# Patient Record
Sex: Female | Born: 1935 | Race: White | Hispanic: No | Marital: Married | Smoking: Former smoker
Health system: Southern US, Community
[De-identification: ages and names within clinical notes are randomized; demographics above are authoritative.]

## PROBLEM LIST (undated history)

## (undated) DIAGNOSIS — A0472 Enterocolitis due to Clostridium difficile, not specified as recurrent: Secondary | ICD-10-CM

## (undated) DIAGNOSIS — G8929 Other chronic pain: Secondary | ICD-10-CM

## (undated) DIAGNOSIS — N189 Chronic kidney disease, unspecified: Secondary | ICD-10-CM

## (undated) DIAGNOSIS — I82409 Acute embolism and thrombosis of unspecified deep veins of unspecified lower extremity: Secondary | ICD-10-CM

## (undated) DIAGNOSIS — F32A Depression, unspecified: Secondary | ICD-10-CM

## (undated) DIAGNOSIS — F0390 Unspecified dementia without behavioral disturbance: Secondary | ICD-10-CM

## (undated) DIAGNOSIS — I714 Abdominal aortic aneurysm, without rupture, unspecified: Secondary | ICD-10-CM

## (undated) DIAGNOSIS — R413 Other amnesia: Secondary | ICD-10-CM

## (undated) DIAGNOSIS — M259 Joint disorder, unspecified: Secondary | ICD-10-CM

## (undated) DIAGNOSIS — S3992XA Unspecified injury of lower back, initial encounter: Secondary | ICD-10-CM

## (undated) DIAGNOSIS — I10 Essential (primary) hypertension: Secondary | ICD-10-CM

## (undated) DIAGNOSIS — J449 Chronic obstructive pulmonary disease, unspecified: Secondary | ICD-10-CM

## (undated) DIAGNOSIS — J9610 Chronic respiratory failure, unspecified whether with hypoxia or hypercapnia: Secondary | ICD-10-CM

## (undated) DIAGNOSIS — F329 Major depressive disorder, single episode, unspecified: Secondary | ICD-10-CM

## (undated) DIAGNOSIS — D649 Anemia, unspecified: Secondary | ICD-10-CM

## (undated) DIAGNOSIS — S42301A Unspecified fracture of shaft of humerus, right arm, initial encounter for closed fracture: Secondary | ICD-10-CM

## (undated) DIAGNOSIS — W19XXXA Unspecified fall, initial encounter: Secondary | ICD-10-CM

## (undated) DIAGNOSIS — M549 Dorsalgia, unspecified: Secondary | ICD-10-CM

## (undated) DIAGNOSIS — F410 Panic disorder [episodic paroxysmal anxiety] without agoraphobia: Secondary | ICD-10-CM

## (undated) DIAGNOSIS — F419 Anxiety disorder, unspecified: Secondary | ICD-10-CM

## (undated) DIAGNOSIS — C801 Malignant (primary) neoplasm, unspecified: Secondary | ICD-10-CM

## (undated) DIAGNOSIS — Y92129 Unspecified place in nursing home as the place of occurrence of the external cause: Secondary | ICD-10-CM

## (undated) DIAGNOSIS — N950 Postmenopausal bleeding: Principal | ICD-10-CM

## (undated) HISTORY — DX: Chronic obstructive pulmonary disease, unspecified: J44.9

## (undated) HISTORY — DX: Acute embolism and thrombosis of unspecified deep veins of unspecified lower extremity: I82.409

## (undated) HISTORY — DX: Major depressive disorder, single episode, unspecified: F32.9

## (undated) HISTORY — DX: Depression, unspecified: F32.A

## (undated) HISTORY — DX: Malignant (primary) neoplasm, unspecified: C80.1

## (undated) HISTORY — DX: Abdominal aortic aneurysm, without rupture, unspecified: I71.40

## (undated) HISTORY — DX: Postmenopausal bleeding: N95.0

## (undated) HISTORY — DX: Unspecified place in nursing home as the place of occurrence of the external cause: Y92.129

## (undated) HISTORY — DX: Abdominal aortic aneurysm, without rupture: I71.4

## (undated) HISTORY — DX: Unspecified fall, initial encounter: W19.XXXA

## (undated) HISTORY — PX: HEMORRHOID SURGERY: SHX153

## (undated) HISTORY — DX: Enterocolitis due to Clostridium difficile, not specified as recurrent: A04.72

## (undated) HISTORY — DX: Unspecified dementia, unspecified severity, without behavioral disturbance, psychotic disturbance, mood disturbance, and anxiety: F03.90

---

## 1998-10-13 ENCOUNTER — Ambulatory Visit (HOSPITAL_COMMUNITY): Admission: RE | Admit: 1998-10-13 | Discharge: 1998-10-13 | Payer: Self-pay | Admitting: Family Medicine

## 1999-12-04 ENCOUNTER — Other Ambulatory Visit: Admission: RE | Admit: 1999-12-04 | Discharge: 1999-12-04 | Payer: Self-pay | Admitting: Gynecology

## 2001-07-21 ENCOUNTER — Encounter: Payer: Self-pay | Admitting: Orthopaedic Surgery

## 2001-07-21 ENCOUNTER — Encounter: Admission: RE | Admit: 2001-07-21 | Discharge: 2001-07-21 | Payer: Self-pay | Admitting: Orthopaedic Surgery

## 2002-09-30 ENCOUNTER — Ambulatory Visit (HOSPITAL_COMMUNITY): Admission: RE | Admit: 2002-09-30 | Discharge: 2002-09-30 | Payer: Self-pay | Admitting: Family Medicine

## 2002-11-16 ENCOUNTER — Other Ambulatory Visit: Admission: RE | Admit: 2002-11-16 | Discharge: 2002-11-16 | Payer: Self-pay | Admitting: Gynecology

## 2003-02-17 ENCOUNTER — Encounter: Admission: RE | Admit: 2003-02-17 | Discharge: 2003-02-17 | Payer: Self-pay | Admitting: Family Medicine

## 2003-02-17 ENCOUNTER — Encounter: Payer: Self-pay | Admitting: Family Medicine

## 2003-07-21 ENCOUNTER — Encounter: Admission: RE | Admit: 2003-07-21 | Discharge: 2003-07-21 | Payer: Self-pay | Admitting: Family Medicine

## 2004-01-22 ENCOUNTER — Observation Stay (HOSPITAL_COMMUNITY): Admission: EM | Admit: 2004-01-22 | Discharge: 2004-01-26 | Payer: Self-pay | Admitting: Emergency Medicine

## 2004-01-26 ENCOUNTER — Inpatient Hospital Stay
Admission: RE | Admit: 2004-01-26 | Discharge: 2004-02-03 | Payer: Self-pay | Admitting: Physical Medicine & Rehabilitation

## 2004-03-03 ENCOUNTER — Encounter
Admission: RE | Admit: 2004-03-03 | Discharge: 2004-06-01 | Payer: Self-pay | Admitting: Physical Medicine & Rehabilitation

## 2004-03-06 ENCOUNTER — Ambulatory Visit: Payer: Self-pay | Admitting: Physical Medicine & Rehabilitation

## 2005-10-12 ENCOUNTER — Other Ambulatory Visit: Admission: RE | Admit: 2005-10-12 | Discharge: 2005-10-12 | Payer: Self-pay | Admitting: Family Medicine

## 2005-10-15 ENCOUNTER — Encounter (HOSPITAL_COMMUNITY): Admission: RE | Admit: 2005-10-15 | Discharge: 2006-01-13 | Payer: Self-pay | Admitting: Family Medicine

## 2005-12-28 ENCOUNTER — Encounter: Admission: RE | Admit: 2005-12-28 | Discharge: 2005-12-28 | Payer: Self-pay | Admitting: Family Medicine

## 2006-08-07 ENCOUNTER — Encounter: Admission: RE | Admit: 2006-08-07 | Discharge: 2006-08-07 | Payer: Self-pay | Admitting: Family Medicine

## 2006-08-15 ENCOUNTER — Ambulatory Visit: Payer: Self-pay | Admitting: Vascular Surgery

## 2006-12-18 ENCOUNTER — Ambulatory Visit: Payer: Self-pay | Admitting: Vascular Surgery

## 2007-01-06 ENCOUNTER — Ambulatory Visit (HOSPITAL_COMMUNITY): Admission: RE | Admit: 2007-01-06 | Discharge: 2007-01-06 | Payer: Self-pay | Admitting: Orthopedic Surgery

## 2007-03-18 ENCOUNTER — Other Ambulatory Visit: Admission: RE | Admit: 2007-03-18 | Discharge: 2007-03-18 | Payer: Self-pay | Admitting: Gynecology

## 2007-09-12 ENCOUNTER — Emergency Department (HOSPITAL_COMMUNITY): Admission: EM | Admit: 2007-09-12 | Discharge: 2007-09-12 | Payer: Self-pay | Admitting: Emergency Medicine

## 2007-10-09 ENCOUNTER — Observation Stay (HOSPITAL_COMMUNITY): Admission: EM | Admit: 2007-10-09 | Discharge: 2007-10-10 | Payer: Self-pay | Admitting: Emergency Medicine

## 2008-01-16 ENCOUNTER — Inpatient Hospital Stay (HOSPITAL_COMMUNITY): Admission: EM | Admit: 2008-01-16 | Discharge: 2008-01-19 | Payer: Self-pay | Admitting: Internal Medicine

## 2008-06-10 ENCOUNTER — Emergency Department (HOSPITAL_COMMUNITY): Admission: EM | Admit: 2008-06-10 | Discharge: 2008-06-10 | Payer: Self-pay | Admitting: Emergency Medicine

## 2009-08-26 ENCOUNTER — Observation Stay (HOSPITAL_COMMUNITY): Admission: EM | Admit: 2009-08-26 | Discharge: 2009-08-27 | Payer: Self-pay | Admitting: Family Medicine

## 2009-11-23 ENCOUNTER — Emergency Department (HOSPITAL_COMMUNITY): Admission: EM | Admit: 2009-11-23 | Discharge: 2009-11-23 | Payer: Self-pay | Admitting: Emergency Medicine

## 2010-09-03 LAB — VITAMIN B12: Vitamin B-12: 227 pg/mL (ref 211–911)

## 2010-09-03 LAB — CBC
HCT: 18.4 % — ABNORMAL LOW (ref 36.0–46.0)
MCHC: 31.4 g/dL (ref 30.0–36.0)
MCV: 74.6 fL — ABNORMAL LOW (ref 78.0–100.0)
Platelets: 303 10*3/uL (ref 150–400)
Platelets: 314 10*3/uL (ref 150–400)
RBC: 2.67 MIL/uL — ABNORMAL LOW (ref 3.87–5.11)
RBC: 3.52 MIL/uL — ABNORMAL LOW (ref 3.87–5.11)
RDW: 17.4 % — ABNORMAL HIGH (ref 11.5–15.5)
WBC: 7.3 10*3/uL (ref 4.0–10.5)

## 2010-09-03 LAB — CROSSMATCH
ABO/RH(D): O POS
Antibody Screen: NEGATIVE

## 2010-09-03 LAB — COMPREHENSIVE METABOLIC PANEL
ALT: 11 U/L (ref 0–35)
GFR calc Af Amer: 60 mL/min — ABNORMAL LOW (ref >60)
GFR calc non Af Amer: 49 mL/min — ABNORMAL LOW (ref >60)
Potassium: 4.4 mEq/L (ref 3.5–5.1)
Sodium: 140 mEq/L (ref 135–145)
Total Bilirubin: 0.5 mg/dL (ref 0.3–1.2)
Total Protein: 6 g/dL (ref 6.0–8.3)

## 2010-09-03 LAB — DIFFERENTIAL
Basophils Relative: 1 % (ref 0–1)
Lymphs Abs: 2 10*3/uL (ref 0.7–4.0)
Monocytes Relative: 9 % (ref 3–12)
Neutro Abs: 4.6 10*3/uL (ref 1.7–7.7)

## 2010-09-03 LAB — ABO/RH: ABO/RH(D): O POS

## 2010-09-03 LAB — FOLATE RBC: RBC Folate: 700 ng/mL — ABNORMAL HIGH (ref 180–600)

## 2010-09-03 LAB — IRON AND TIBC: Iron: <10 ug/dL — ABNORMAL LOW (ref 42–135)

## 2010-09-03 LAB — FERRITIN: Ferritin: 3 ng/mL — ABNORMAL LOW (ref 10–291)

## 2010-10-24 NOTE — H&P (Signed)
[name], LINEMAN                   ACCOUNT NO.:  [id number]   MEDICAL RECORD NO.:  [id number]          PATIENT TYPE:  INP   LOCATION:  1415                         FACILITY:  [facility]   PHYSICIAN:  Mick Sell, MD DATE OF BIRTH:  01-20-1927   DATE OF ADMISSION:  01/16/2008  DATE OF DISCHARGE:                              HISTORY & PHYSICAL   PRIMARY MEDICAL DOCTOR:  Duncan Dull, M.D.  Being admitted to [name], M.D., for [name].   CHIEF COMPLAINT:  Dark black stools and vomiting blood.   HISTORY OF PRESENT ILLNESS:  This is a pleasant 77 year old white female  with a history of hypertension, small aortic aneurysm and several recent  falls with a vertebral fracture in May who presented to Dr. Kevan Ny'  office this morning with a complaint of some dark black vomitus x1 this  morning.  On further history, she reported 1 month of dark black stools  that are loose.  She has had no prior vomiting.  She has been taking  some aspirin, at least 2 tablets of regular strength a day, perhaps a  little more for the last several weeks for her low back pain.  She  denies any fevers, chills, night sweats, weight loss, prior nausea,  vomiting or abdominal pain.  She has had no bright red blood per rectum.   PAST MEDICAL HISTORY:  1. Hypertension.  2. A small aortic aneurysm, monitored as an outpatient.  3. History of hiatal hernia and colonic polyp, apparently diagnosed by      Dr. Randa Evens several years ago on an outpatient EGD and colonoscopy.  4. Vertebral fracture in May 2009.  Declined vertebroplasty.  5. Several recent falls.   PAST SURGICAL HISTORY:  She denies any surgeries.   SOCIAL HISTORY:  She smokes a pack a day, has for many years.  Denies  any alcohol.  She lives with her husband, who has dementia.  They live  very close to her children, who check in on her.  She has no other home  assistance it seems.   FAMILY HISTORY:  Noncontributory.   ALLERGIES:  THE PATIENT SAYS SHE IS ALLERGIC TO KEFLEX, BUT IT IS  UNCLEAR WHAT THE REACTION WAS.   MEDICATIONS:  The patient currently takes lisinopril 10 mg once a day.  She also takes a multivitamin.  She has been taking over-the-counter  aspirin as described above.  She denies any NSAIDS.   REVIEW OF SYSTEMS:  Eleven systems reviewed and negative or as per HPI.   PHYSICAL EXAMINATION:  GENERAL:  The patient is a very pleasant white  female in no acute distress.  VITAL SIGNS:  Temperature is 98.2, pulse of 75, blood pressure 124/65,  respirations 22, satting 99% on room air.  HEENT:  Pupils are equal, round, reactive to light and accommodation.  Extraocular movements are intact.  Sclerae are anicteric.  She has mild  conjunctival pallor.  Oropharynx is clear.  She has dentures.  NECK:  Supple.  No anterior cervical, posterior cervical,  supraclavicular lymphadenopathy.  HEART:  Regular with distant heart sounds, but no murmurs, rubs or  gallops appreciated.  LUNGS:  Clear to auscultation.  ABDOMEN:  Soft, nontender, nondistended.  No hepatosplenomegaly.  Positive bowel sounds.  EXTREMITIES:  There is 1+ bilateral edema to her mid shins.  On her  right she has approximately a 2-cm laceration with some mild surrounding  pinkness and warmth.  No drainage from the wound.  NEUROLOGIC:  She is alert and oriented x2 and grossly intact.   DATA:  The patient had blood work done at Dr. Kevan Ny' office with a  hemoglobin of 7.6.  Repeat here shows a white count of 7.9, hemoglobin  7.5, platelets of 308.  Basic panel is within normal limits.  BUN and  creatinine of 9 and 0.88.  LFTs are within normal limits except for an  albumin low at 3.1 and total protein low at 5.6.  Calcium is within  normal limits.  PT/PTT are pending.  There is no imaging.   IMPRESSION:  A 77 year old female with a relatively benign past medical  history who has been taking some aspirin for a vertebral fracture and   now with 1 month of dark black stools and 1 day of vomiting black  material.  She has dropped her hemoglobin from a result in April of 10.2  to 7.5.  I think this is more of a chronic bleed, and she has no  evidence of active massive bleed at this time.   PLAN:  1. I have consulted GI, and they will see her today and scope her      tomorrow.  2. I have placed her on Protonix IV per protocol.  3. We will keep her NPO except ice chips.  4. Give her IV fluids at 100 mL an hour overnight.  5. She has had 2 large-bore IVs placed and will be transfused 2 units      to maintain hemoglobin over 10.0.  6. Check H&H q.6 h.  7. PT/PTT will be pending.  8. For her lower extremity laceration and mild cellulitis, we will      give her doxycycline 100 mg twice a day for 7 days.  9. The patient will be NPO overnight for a scope in the morning.      Mick Sell, MD  Electronically Signed     DPF/MEDQ  D:  01/16/2008  T:  01/16/2008  Job:  [id number]

## 2010-10-24 NOTE — Procedures (Signed)
DUPLEX ULTRASOUND OF ABDOMINAL AORTA   INDICATION:  Follow up abdominal aortic aneurysm.   HISTORY:  Diabetes:  No  Cardiac:  Murmur  Hypertension:  Yes  Smoking:  Less than one pack per day  Family History:  Yes  Previous Surgery:   DUPLEX EXAM:         AP (cm)                   TRANSVERSE (cm)  Proximal  Mid                  2.83 cm                   3.04 cm  Distal               1.95 cm                   2.16 cm  Right Iliac          1.13 cm  Left Iliac           0.91 cm   PREVIOUS:  Date: 06/13/2005  AP:  3.23  TRANSVERSE:  3.04   IMPRESSION:  1. Stable measurements of known abdominal aortic aneurysm.  2. Proximal aorta not detected due to bowel gas.   ___________________________________________  Di Kindle. Edilia Bo, M.D.   DP/MEDQ  D:  12/18/2006  T:  12/18/2006  Job:  [id number]

## 2010-10-24 NOTE — Op Note (Signed)
NAMEDanitza, [name]                   ACCOUNT NO.:  [id number]   MEDICAL RECORD NO.:  [id number]          PATIENT TYPE:  INP   LOCATION:  1415                         [facility]ACILITY:  [facility]   PHYSICIAN:  Graylin Shiver, M.D.   DATE OF BIRTH:  May 24, 1927   DATE OF PROCEDURE:  01/17/2008  DATE OF DISCHARGE:                               OPERATIVE REPORT   INDICATIONS FOR PROCEDURE:  Anemia, gastrointestinal bleeding  characterized by melena.   Informed consent was obtained after explanation of the risks of  bleeding, infection and perforation.   PREMEDICATION:  Fentanyl 50 mcg IV, Versed 7.5 mg IV.   PROCEDURE:  With the patient in the left lateral decubitus position, the  Pentax gastroscope was inserted into the oropharynx and passed into the  esophagus.  It was advanced down the esophagus and then into the  stomach.  There was a Schatzki's ring noted at the distal esophagus and  when the scope popped through the ring into the stomach, there was some  slight resistance and some oozing of blood at the level of the  Schatzki's ring.  This would not be the source of anemia.  The scope was  advanced into the duodenum.  The 2nd portion and bulb of the duodenum  were normal.  The stomach had a normal-appearing antrum and body.  There  was a hiatal hernia, but the mucosa in the hernia looked normal.  The  esophagus otherwise looked normal except for the Schatzki's ring.  She  tolerated the procedure well without complications.   IMPRESSION:  1. Hiatal hernia.  2. Schatzki's ring   Hemoglobin and hematocrit today are 9.9 and 30.4, respectively.   RECOMMENDATIONS:  I see nothing on this exam to explain her anemia and  intermittent melena over the last several weeks.  I would recommend  following her hemoglobin and hematocrit and if they are stable, she  could  probably be discharged in a day or two with further outpatient followup  with Dr. Randa Evens who has was seen her in the past.  She has  had a  colonoscopy in the past.  This may need to be repeated and if negative,  would consider capsule endoscopy.           ______________________________  Graylin Shiver, M.D.     SFG/MEDQ  D:  01/17/2008  T:  01/17/2008  Job:  [id number]   cc:   Duncan Dull, M.D.  Fax: [phone]   Llana Aliment. Malon Kindle., M.D.  Fax: [phone]

## 2010-10-24 NOTE — Assessment & Plan Note (Signed)
OFFICE VISIT   Key, Jillian L  DOB:  30-Apr-1927                                       12/18/2006  ZOXWR#:[id number]   I saw patient in the office today for continued followup of her small  abdominal aortic aneurysm.  It has been a year and a half since her last  ultrasound.  At that time, the maximum diameter was 3.2 cm.  This was  January, 2007.  Since I saw her last, she has had some chronic low back  pain but no acute abdominal or back pain.   On review of systems, she has had no recent chest pain, chest pressure,  palpitations, or arrhythmias.  She has had no bronchitis, asthma, or  wheezing.   SOCIAL HISTORY:  She had quit smoking for about two years but has  started smoking again.   PHYSICAL EXAMINATION:  Blood pressure is 122/73.  Heart rate is 82.  I  do not detect any carotid bruits.  Lungs are clear bilaterally to  auscultation.  On cardiac exam, she has an irregular rate and rhythm.  Abdomen is soft and nontender.  Her aneurysm is palpable and nontender.  She has palpable femoral, popliteal, and posterior tibial pulses  bilaterally.   Duplex scan in our office today shows a maximum diameter aneurysm that  is 3.04 cm.  She understands that we would not consider elective repair  unless the aneurysm reached 5.5 cm in maximum diameter.  I have stressed  to her the importance of tobacco cessation, as smoking has been  associated with increased risk of aneurysm enlargement and rupture.   Given that the aneurysm has remained stable at 3 cm, which is quite  small, I think we can continue our followup at 1-1/2 years.  I will see  her back in that time period.  She knows to call sooner if she has  problems.   Di Kindle. Edilia Bo, M.D.  Electronically Signed   CSD/MEDQ  D:  12/18/2006  T:  12/19/2006  Job:  [id number]   cc:   Duncan Dull, M.D.

## 2010-10-24 NOTE — Consult Note (Signed)
[name], [name]                   ACCOUNT NO.:  [id number]   MEDICAL RECORD NO.:  [id number]          PATIENT TYPE:  INP   LOCATION:  1415                         [facility]ACILITY:  [facility]   PHYSICIAN:  Graylin Shiver, M.D.   DATE OF BIRTH:  1926/12/24   DATE OF CONSULTATION:  01/16/2008  DATE OF DISCHARGE:                                 CONSULTATION   REASON FOR CONSULTATION:  The patient is a 77 year old female who saw  Dr. Kevan Ny in the office today. She had vomited and spit up some dark-  colored, coffee-ground material yesterday afternoon. She states that she  has been passing intermittent dark stools for the past month. She has  taken some aspirin recently.  She denies any abdominal pain or  heartburn.  She has a hemoglobin and hematocrit of 7.5 and 22.7,  platelet count 308,000.   She had an endoscopy a couple years ago showing a hiatal hernia by Dr.  Randa Evens. She also had a colonoscopy around that time which showed a  hyperplastic polyp.   PAST HISTORY:  Hypertension.   MEDICATIONS:  Lisinopril, occasional aspirin.   ALLERGIES:  KEFLEX.   SOCIAL HISTORY:  Smokes off and on, does not drink alcohol.   REVIEW OF SYSTEMS:  No chest pain, shortness of breath, cough or sputum  production.   PHYSICAL EXAMINATION:  GENERAL:  She does not appear in any acute  distress.  VITAL SIGNS:  Pulse 75, blood pressure 124/65.  HEENT:  Nonicteric.  HEART:  Regular rhythm.  No murmurs.  LUNGS:  Clear.  ABDOMEN:  Soft, nontender, no hepatosplenomegaly.  EXTREMITIES:  No edema.   IMPRESSION:  Gastrointestinal bleeding characterized by melena and  coffee-ground emesis. The patient also has anemia secondary to this.   PLAN:  She is admitted to the hospital and to the hospitalist service.  She will receive blood transfusion, Protonix IV, we will follow her H&H  as we will proceed with endoscopy tomorrow morning.           ______________________________  Graylin Shiver, M.D.     SFG/MEDQ   D:  01/16/2008  T:  01/17/2008  Job:  [id number]   cc:   Duncan Dull, M.D.  Fax: [phone]   Llana Aliment. Malon Kindle., M.D.  Fax: [phone]

## 2010-10-24 NOTE — H&P (Signed)
NAMESharnelle, [name] Key                   ACCOUNT NO.:  [id number]   MEDICAL RECORD NO.:  [id number]          PATIENT TYPE:  EMS   LOCATION:  ED                            FACILITY:  [facility]   PHYSICIAN:  Corinna L. Lendell Caprice, MDDATE OF BIRTH:  [birth year]   DATE OF ADMISSION:  09/12/2007  DATE OF DISCHARGE:  09/12/2007                              HISTORY & PHYSICAL   CHIEF COMPLAINT:  Fall and back pain.   HISTORY OF PRESENT ILLNESS:  [name] is a 77 year old white female  patient of Dr. Shaune Pollack who presents after losing her balance and  falling onto her back this morning.  Initially, she said that she was  unable to get up; however, subsequently, she told me that she got up to  go to the commode and then subsequently told me that it was her son who  helped her get up to the commode.  Nevertheless, she is having  difficulty even sitting up and is concerned about going back home.  She  has a history of a fall several years ago with L1 and L2 vertebral  compression fractures.  She refused a vertebroplasty at that time but  did require a stay in the subacute care unit.  She is rather vague with  regard to the exact location of her pain but just points to the lumbar  area up to thoracic area.  Initially, she feels no numbness or tingling.  No bowel or bladder incontinence.   PAST MEDICAL HISTORY:  As above, also hypertension and reported aortic  aneurysm.   MEDICATIONS:  1. Valium as needed.  2. Lisinopril 5 mg a day.   SOCIAL HISTORY:  She cares for her husband who has Alzheimer dementia.  She is here with her daughter-in-law and granddaughter.  She has a  history of cigarette smoking.  She denies alcohol.   FAMILY HISTORY:  Noncontributory.   REVIEW OF SYSTEMS:  As above, otherwise negative.   PHYSICAL EXAMINATION:  VITAL SIGNS:  Temperature is 98.6, blood pressure  160/71, pulse 72, respiratory rate 18, and oxygen saturation 99% on room  air.  GENERAL:  The patient is in no  acute distress but moans when  transitioning to a sitting position.  HEENT:  Normocephalic, atraumatic.  Pupils are equal, round, and  reactive to light.  Dry mucous membranes.  NECK/SPINE:  Supple.  No point tenderness along the C-spine.  No  tenderness along T-L-spine,  but when sitting up, the patient points to  the left paraspinous lumbar area.  This area is nontender and has no  bruising.  No CVA tenderness.  LUNGS:  Clear to auscultation bilaterally without wheezes, rhonchi, or  rales.  CARDIOVASCULAR:  Regular rate and rhythm without murmurs, gallops, or  rubs.  ABDOMEN:  Obese, soft, and nontender.  GU AND RECTAL:  Deferred.  EXTREMITIES:  No clubbing, cyanosis, or edema.  No deformities.  SKIN:  No rash, abrasions, contusions, or ecchymoses.  PSYCHIATRIC:  The patient is occasionally agitated.  NEUROLOGIC:  Alert and oriented.  Cranial nerves and sensorimotor exam  are  intact.   LABORATORY DATA:  None.   X-RAYS:  Lumbar spine x-ray shows nothing acute, stable L1-L2  compression fractures.  Thoracic spine films show nothing acute.   ASSESSMENT/PLAN:  1. Status post fall with back pain.  Suspect muscle spasm.  The      patient will be placed on 23-hour observation.  She will get      physical therapy, occupational therapy.  I will start Tylenol 4      times a day, Robaxin 3 times a day for 2 days, and Dilaudid as      needed.  She may need placement if she has no improvement and is      still unable to walk.  2. Hypertension.  Continue lisinopril.  3. Anxiety.  Continue Valium as needed.      Corinna L. Lendell Caprice, MD  Electronically Signed     CLS/MEDQ  D:  10/09/2007  T:  10/10/2007  Job:  [id number]   cc:   Duncan Dull, M.D.

## 2010-10-27 NOTE — Discharge Summary (Signed)
NAMETeneshia, [name] [name]                   ACCOUNT NO.:  [id number]   MEDICAL RECORD NO.:  [id number]          PATIENT TYPE:  INP   LOCATION:  5028                         FACILITY:  [facility]   PHYSICIAN:  Corinna L. Lendell Caprice, MDDATE OF BIRTH:  [birth year]   DATE OF ADMISSION:  10/09/2007  DATE OF DISCHARGE:  10/10/2007                               DISCHARGE SUMMARY   DISCHARGE DIAGNOSES:  1. Status post fall and subsequent back pain secondary to muscle      spasm, improved.  2. Gait instability secondary to above, improved.  3. Hypertension.  4. Anxiety.   DISCHARGE MEDICATIONS:  Continue Valium as needed, stop lisinopril, take  Dilaudid 2 mg every 4 hours as needed for pain, Robaxin 100 mg p.o.  t.i.d. for 2 days, and Tylenol 650 mg p.o. q.i.d.   Follow up with Dr. Shaune Pollack next week.   ACTIVITY:  She is to use walker.  No driving until cleared by Dr. Kevan Ny.  Home physical therapy, occupational therapy, and nursing aid is being  arranged as an outpatient.   CONDITION:  Stable.   CONSULTATIONS:  None.   PROCEDURES:  None.   LABS:  CBC on admission significant for hemoglobin of 10.2, hematocrit  31.1, otherwise unremarkable.  Basic metabolic panel unremarkable.  Urinalysis unremarkable.  Liver function tests unremarkable.  EKG shows  normal sinus rhythm.  X-ray of the lumbar spine showed stable  compression fractures at L1 and L2.  Thoracic spine films showed nothing  acute.   HISTORY AND HOSPITAL COURSE:  [name] is a 77 year old white female  who fell and reported that she was unable to get up; however, upon  further questioning, she was actually able to get up to the commode.  She was rather vague with respect to the exact location of the pain, but  apparently she was having difficulty walking.  She had normal vital  signs and she seemed to be in quite a bit of pain, particularly sitting  up.  She had no point tenderness along the spine, but did pointed to her  paraspinous  lumbar area.  It was felt that she had muscle spasm and no  acute fracture.  She was placed on 23-hour observation, given pain  medication, muscle relaxants, physical therapy, occupational therapy,  and the following day, felt much better and was able to ambulate with  assistance.     Corinna L. Lendell Caprice, MD  Electronically Signed    CLS/MEDQ  D:  11/12/2007  T:  11/13/2007  Job:  [id number]

## 2010-10-27 NOTE — Discharge Summary (Signed)
NAMETanisia, Yokley Allyiah                   ACCOUNT NO.:  [id number]   MEDICAL RECORD NO.:  [id number]          PATIENT TYPE:  INP   LOCATION:  1415                         FACILITY:  [facility]   PHYSICIAN:  [name], M.D.DATE OF BIRTH:  1926/10/04   DATE OF ADMISSION:  01/16/2008  DATE OF DISCHARGE:  01/19/2008                               DISCHARGE SUMMARY   DISCHARGE DIAGNOSES:  1. Anemia - hemoglobin stable status post transfusion, EGD without a      bleeding source.  2. Schatzki's ring - per EGD.  3. History of small aortic aneurysm followed outpatient.  4. History of vertebral fracture in May 2000 - declined      vertebroplasty.  5. History of falls.  6. History of hiatal hernia and colonic polyp.   PROCEDURE AND STUDIES:  EGD - per Dr. Evette Cristal, hiatal hernia and  Schatzki's ring reported.  Nothing on exam to explain her anemia and  intermittent melena.   CONSULTATION:  [facility].   BRIEF HISTORY:  The patient is a 77 year old white female with the above  listed medical problems who presented with complaints of melena and  coffee-ground emesis.  It was noted that she had been taking aspirin at  least 2 tablets of the regular strength aspirin for the last several  weeks for her low back pain.  She denied fevers, chills, night sweats,  weight loss.  No abdominal pain.  She also denied hematochezia.  She was  seen by her primary care physician and directly admitted to the hospital  for further evaluation and management.   HOSPITAL COURSE:  1. Anemia - upon admission the patient's H and H was monitored and it      dropped to a low of 7.5 and she was transfused 2 units of packed      red blood cells.  Gastroenterology was consulted and Dr. Evette Cristal saw      the patient initially and an EGD was done and the results as stated      above with no bleeding source.  She did not have any further melena      or coffee-ground emesis in the hospital and her H and H remained  stable - her last hemoglobin prior to transfusion was 9.3.  Dr.      Randa Evens followed up with her in the hospital and indicated that it      was okay for her to be discharged and she was to follow up with him      for repeat hemoglobin and further evaluation/management as      appropriate.  2. Lower extremity cellulitis - upon admission the patient was found      to have 1+ lower extremity edema and on her right lower extremity      there was a 2 cm laceration with some mild surrounding erythema and      warmth.  She was empirically placed on antibiotics.  She remained      afebrile and hemodynamically stable and without leukocytosis - her      last leukocytosis  prior to discharge 9.0.  3. Hypertension - the patient was to continue her preadmission      medications upon discharge.   DISCHARGE MEDICATIONS:  1. Protonix 40 mg p.o. b.i.d.  2. Doxycycline 100 mg one p.o. b.i.d.  3. The patient to continue Valium 5 mg p.r.n. as previously.  4. Lisinopril 5 mg p.o. daily.   FOLLOWUP CARE:  1. Dr. Randa Evens on Wednesday August 12 for recheck hemoglobin as      directed.  2. Dr. Kevan Ny In 1-2 weeks.   DISCHARGE CONDITION:  Improved.  Stable.      [name], M.D.  Electronically Signed     ACV/MEDQ  D:  03/11/2008  T:  03/11/2008  Job:  [id number]   cc:   Duncan Dull, M.D.  Fax: [phone]   Llana Aliment. Malon Kindle., M.D.  Fax: [phone]

## 2011-03-06 LAB — BASIC METABOLIC PANEL
CO2: 30
Chloride: 104
Creatinine, Ser: 1
GFR calc Af Amer: >60
Potassium: 4
Sodium: 140

## 2011-03-06 LAB — CBC
HCT: 30 — ABNORMAL LOW
HCT: 31.1 — ABNORMAL LOW
Hemoglobin: 10.5 — ABNORMAL LOW
Hemoglobin: 10.2 — ABNORMAL LOW
MCHC: 35.1
MCV: 87.8
RBC: 3.42 — ABNORMAL LOW
RBC: 3.55 — ABNORMAL LOW
WBC: 6.5

## 2011-03-06 LAB — URINALYSIS, ROUTINE W REFLEX MICROSCOPIC
Bilirubin Urine: NEGATIVE
Bilirubin Urine: NEGATIVE
Glucose, UA: NEGATIVE
Glucose, UA: NEGATIVE
Hgb urine dipstick: NEGATIVE
Nitrite: NEGATIVE
Protein, ur: NEGATIVE
Specific Gravity, Urine: 1.006
pH: 7.5

## 2011-03-06 LAB — DIFFERENTIAL
Basophils Absolute: 0.3 — ABNORMAL HIGH
Basophils Relative: 1
Basophils Relative: 3 — ABNORMAL HIGH
Eosinophils Absolute: 0.1
Eosinophils Absolute: 0
Lymphs Abs: 1.8
Monocytes Absolute: 0.6
Monocytes Relative: 10
Monocytes Relative: 6
Neutrophils Relative %: 61
Neutrophils Relative %: 76

## 2011-03-06 LAB — COMPREHENSIVE METABOLIC PANEL
ALT: 13
Alkaline Phosphatase: 94
CO2: 30
GFR calc non Af Amer: 49 — ABNORMAL LOW
Glucose, Bld: 88
Potassium: 4.1
Sodium: 142
Total Bilirubin: 0.5

## 2011-03-06 LAB — URINE MICROSCOPIC-ADD ON

## 2011-03-09 LAB — PREPARE RBC (CROSSMATCH)

## 2011-03-09 LAB — BASIC METABOLIC PANEL
BUN: 9
Chloride: 110
Creatinine, Ser: 0.87
Creatinine, Ser: 0.97
GFR calc Af Amer: >60
GFR calc non Af Amer: 55 — ABNORMAL LOW
GFR calc non Af Amer: >60
Potassium: 3.6

## 2011-03-09 LAB — COMPREHENSIVE METABOLIC PANEL
AST: 14
Albumin: 3.1 — ABNORMAL LOW
Chloride: 104
Creatinine, Ser: 0.88
GFR calc Af Amer: >60
Sodium: 140
Total Bilirubin: 0.7

## 2011-03-09 LAB — CBC
MCHC: 32.7
MCHC: 32.9
MCV: 86.5
Platelets: 265
Platelets: 268
Platelets: 276
RBC: 2.74 — ABNORMAL LOW
RDW: 14.9
RDW: 16 — ABNORMAL HIGH
WBC: 7.9
WBC: 8.4
WBC: 9

## 2011-03-09 LAB — ABO/RH: ABO/RH(D): O POS

## 2011-03-09 LAB — PROTIME-INR
INR: 1.1
Prothrombin Time: 14

## 2011-03-09 LAB — TYPE AND SCREEN

## 2011-03-09 LAB — HEMOGLOBIN AND HEMATOCRIT, BLOOD
HCT: 29.8 — ABNORMAL LOW
Hemoglobin: 9.9 — ABNORMAL LOW
Hemoglobin: 9.9 — ABNORMAL LOW

## 2011-03-16 LAB — URINALYSIS, ROUTINE W REFLEX MICROSCOPIC
Nitrite: NEGATIVE
Specific Gravity, Urine: 1.015 (ref 1.005–1.030)
Urobilinogen, UA: 0.2 mg/dL (ref 0.0–1.0)
pH: 5.5 (ref 5.0–8.0)

## 2011-03-16 LAB — CBC
Hemoglobin: 11 g/dL — ABNORMAL LOW (ref 12.0–15.0)
MCHC: 33.2 g/dL (ref 30.0–36.0)
Platelets: 281 10*3/uL (ref 150–400)
RDW: 14.6 % (ref 11.5–15.5)

## 2011-03-16 LAB — TYPE AND SCREEN: ABO/RH(D): O POS

## 2011-03-16 LAB — DIFFERENTIAL
Basophils Absolute: 0.1 10*3/uL (ref 0.0–0.1)
Basophils Relative: 1 % (ref 0–1)
Monocytes Absolute: 0.6 10*3/uL (ref 0.1–1.0)
Neutro Abs: 4.8 10*3/uL (ref 1.7–7.7)

## 2011-03-16 LAB — URINE MICROSCOPIC-ADD ON

## 2011-03-16 LAB — BASIC METABOLIC PANEL
BUN: 12 mg/dL (ref 6–23)
CO2: 30 mEq/L (ref 19–32)
Calcium: 9.7 mg/dL (ref 8.4–10.5)
Creatinine, Ser: 0.82 mg/dL (ref 0.4–1.2)
Glucose, Bld: 104 mg/dL — ABNORMAL HIGH (ref 70–99)

## 2011-03-16 LAB — POCT CARDIAC MARKERS: Troponin i, poc: <0.05 ng/mL (ref 0.00–0.09)

## 2011-06-06 ENCOUNTER — Other Ambulatory Visit: Payer: Self-pay | Admitting: Family Medicine

## 2011-06-06 ENCOUNTER — Ambulatory Visit
Admission: RE | Admit: 2011-06-06 | Discharge: 2011-06-06 | Disposition: A | Discharge status: Home / Self Care | Payer: Medicare Other | Source: Ambulatory Visit | Attending: Family Medicine | Admitting: Family Medicine

## 2011-06-06 DIAGNOSIS — R05 Cough: Secondary | ICD-10-CM

## 2011-06-06 DIAGNOSIS — R062 Wheezing: Secondary | ICD-10-CM

## 2011-06-06 DIAGNOSIS — R059 Cough, unspecified: Secondary | ICD-10-CM

## 2011-09-25 ENCOUNTER — Ambulatory Visit: Payer: Medicare Other | Attending: Family Medicine

## 2011-10-21 ENCOUNTER — Emergency Department (HOSPITAL_COMMUNITY)
Admission: EM | Admit: 2011-10-21 | Discharge: 2011-10-21 | Disposition: A | Discharge status: Home / Self Care | Payer: Medicare Other | Attending: Emergency Medicine | Admitting: Emergency Medicine

## 2011-10-21 ENCOUNTER — Encounter (HOSPITAL_COMMUNITY): Payer: Self-pay | Admitting: *Deleted

## 2011-10-21 ENCOUNTER — Emergency Department (HOSPITAL_COMMUNITY): Payer: Medicare Other

## 2011-10-21 DIAGNOSIS — S5290XA Unspecified fracture of unspecified forearm, initial encounter for closed fracture: Secondary | ICD-10-CM

## 2011-10-21 DIAGNOSIS — S52599A Other fractures of lower end of unspecified radius, initial encounter for closed fracture: Secondary | ICD-10-CM | POA: Insufficient documentation

## 2011-10-21 DIAGNOSIS — F17200 Nicotine dependence, unspecified, uncomplicated: Secondary | ICD-10-CM | POA: Insufficient documentation

## 2011-10-21 DIAGNOSIS — I10 Essential (primary) hypertension: Secondary | ICD-10-CM | POA: Insufficient documentation

## 2011-10-21 DIAGNOSIS — M79609 Pain in unspecified limb: Secondary | ICD-10-CM | POA: Insufficient documentation

## 2011-10-21 DIAGNOSIS — Y93H2 Activity, gardening and landscaping: Secondary | ICD-10-CM | POA: Insufficient documentation

## 2011-10-21 DIAGNOSIS — R296 Repeated falls: Secondary | ICD-10-CM | POA: Insufficient documentation

## 2011-10-21 HISTORY — DX: Anemia, unspecified: D64.9

## 2011-10-21 HISTORY — DX: Other amnesia: R41.3

## 2011-10-21 HISTORY — DX: Essential (primary) hypertension: I10

## 2011-10-21 MED ORDER — FENTANYL CITRATE 0.05 MG/ML IJ SOLN
12.5000 ug | Freq: Once | INTRAMUSCULAR | Status: AC
  Administered 2011-10-21: 12.5 ug via INTRAVENOUS
  Filled 2011-10-21: qty 2

## 2011-10-21 MED ORDER — HYDROCODONE-ACETAMINOPHEN 5-500 MG PO TABS: 1.0000 | ORAL_TABLET | Freq: Four times a day (QID) | ORAL | Status: AC | PRN

## 2011-10-21 MED ORDER — SODIUM CHLORIDE 0.9 % IV SOLN
INTRAVENOUS | Status: DC
  Administered 2011-10-21: 02:00:00 via INTRAVENOUS

## 2011-10-21 MED ORDER — FENTANYL CITRATE 0.05 MG/ML IJ SOLN
12.5000 ug | Freq: Once | INTRAMUSCULAR | Status: AC
Start: 2011-10-21 — End: 2011-10-21
  Administered 2011-10-21: 12.5 ug via INTRAVENOUS
  Filled 2011-10-21: qty 2

## 2011-10-21 MED ORDER — ONDANSETRON HCL 4 MG/2ML IJ SOLN
4.0000 mg | Freq: Once | INTRAMUSCULAR | Status: AC
  Administered 2011-10-21: 4 mg via INTRAVENOUS
  Filled 2011-10-21: qty 2

## 2011-10-21 NOTE — Discharge Instructions (Signed)
Forearm Fracture Your caregiver has diagnosed you as having a broken bone (fracture) of the forearm. This is the part of your arm between the elbow and your wrist. Your forearm is made up of two bones. These are the radius and ulna. A fracture is a break in one or both bones. A cast or splint is used to protect and keep your injured bone from moving. The cast or splint will be on generally for about 5 to 6 weeks, with individual variations. HOME CARE INSTRUCTIONS   Keep the injured part elevated while sitting or lying down. Keeping the injury above the level of your heart (the center of the chest). This will decrease swelling and pain.   Apply ice to the injury for 15 to 20 minutes, 3 to 4 times per day while awake, for 2 days. Put the ice in a plastic bag and place a thin towel between the bag of ice and your cast or splint.   If you have a plaster or fiberglass cast:   Do not try to scratch the skin under the cast using sharp or pointed objects.   Check the skin around the cast every day. You may put lotion on any red or sore areas.   Keep your cast dry and clean.   If you have a plaster splint:   Wear the splint as directed.   You may loosen the elastic around the splint if your fingers become numb, tingle, or turn cold or blue.   Do not put pressure on any part of your cast or splint. It may break. Rest your cast only on a pillow the first 24 hours until it is fully hardened.   Your cast or splint can be protected during bathing with a plastic bag. Do not lower the cast or splint into water.   Only take over-the-counter or prescription medicines for pain, discomfort, or fever as directed by your caregiver.  SEEK IMMEDIATE MEDICAL CARE IF:   Your cast gets damaged or breaks.   You have more severe pain or swelling than you did before the cast.   Your skin or nails below the injury turn blue or gray, or feel cold or numb.   There is a bad smell or new stains and/or pus like  (purulent) drainage coming from under the cast.  MAKE SURE YOU:   Understand these instructions.   Will watch your condition.   Will get help right away if you are not doing well or get worse.

## 2011-10-21 NOTE — ED Notes (Signed)
Pt reports she was walking outside and fell this afternoon.  Reporting pain in left arm from wrist to elbow.  Mild edema noted in left wrist. Pt denies increase in pain, but just isn't able to tolerate any longer.

## 2011-10-21 NOTE — ED Provider Notes (Signed)
History     CSN: [id number]  Arrival date & time 10/21/11  0865   First MD Initiated Contact with Patient 10/21/11 0115      Chief Complaint  Patient presents with  . Fall  . Arm Pain    (Consider location/radiation/quality/duration/timing/severity/associated sxs/prior treatment) HPI History provided by patient and family bedside. Today was working in the garden and fell onto an outstretched left upper extremity. She injured her left wrist. This occurred around 4 PM in the afternoon. No LOC, head injury or neck pain. No weakness or numbness. No lower extremity injury. She has developed increasing pain and swelling. She denies any other pain injury or trauma. Pain is sharp and throbbing and radiates somewhat towards her left elbow. Hurts to touch and move without any alleviating factors. Pain is moderate to severe and persistent and worsening Past Medical History  Diagnosis Date  . Hypertension   . Memory loss   . Anemia     Past Surgical History  Procedure Date  . Hemorrhoid surgery     History reviewed. No pertinent family history.  History  Substance Use Topics  . Smoking status: Current Everyday Smoker -- 1.0 packs/day    Types: Cigarettes  . Smokeless tobacco: Not on file  . Alcohol Use: No    OB History    Grav Para Term Preterm Abortions TAB SAB Ect Mult Living                  Review of Systems  Constitutional: Negative for fever and chills.  HENT: Negative for neck pain and neck stiffness.   Eyes: Negative for pain.  Respiratory: Negative for shortness of breath.   Cardiovascular: Negative for chest pain.  Gastrointestinal: Negative for abdominal pain.  Genitourinary: Negative for dysuria.  Musculoskeletal: Positive for joint swelling. Negative for back pain.  Skin: Negative for rash.  Neurological: Negative for headaches.  All other systems reviewed and are negative.    Allergies  Keflex  Home Medications   Current Outpatient Rx  Name Route  Sig Dispense Refill  . DIAZEPAM 5 MG PO TABS Oral Take 5 mg by mouth every 6 (six) hours as needed.    . DONEPEZIL HCL 10 MG PO TABS Oral Take 10 mg by mouth at bedtime as needed.    Marland Kitchen ESCITALOPRAM OXALATE 5 MG PO TABS Oral Take 5 mg by mouth daily.    Marland Kitchen FERROUS SULFATE 325 (65 FE) MG PO TABS Oral Take 325 mg by mouth daily with breakfast.    . FOLIC ACID 1 MG PO TABS Oral Take 1 mg by mouth daily.    Marland Kitchen LISINOPRIL 5 MG PO TABS Oral Take 5 mg by mouth daily.    . MULTI-VITAMIN/MINERALS PO TABS Oral Take 1 tablet by mouth daily.    Marland Kitchen VITAMIN D (ERGOCALCIFEROL) 50000 UNITS PO CAPS Oral Take 50,000 Units by mouth every 7 (seven) days.      BP 200/59  Pulse 67  Temp 97.8 F (36.6 C)  Resp 18  Ht 5\' 6"  (1.676 m)  Wt 140 lb (63.504 kg)  BMI 22.60 kg/m2  SpO2 100%  Physical Exam  Constitutional: She is oriented to person, place, and time. She appears well-developed and well-nourished.  HENT:  Head: Normocephalic and atraumatic.  Eyes: Conjunctivae and EOM are normal. Pupils are equal, round, and reactive to light.  Neck: Trachea normal. Neck supple. No thyromegaly present.  Cardiovascular: Normal rate, regular rhythm, S1 normal, S2 normal and normal pulses.  No systolic murmur is present   No diastolic murmur is present  Pulses:      Radial pulses are 2+ on the right side, and 2+ on the left side.  Pulmonary/Chest: Effort normal and breath sounds normal. She has no wheezes. She has no rhonchi. She has no rales. She exhibits no tenderness.  Abdominal: Soft. Normal appearance and bowel sounds are normal. There is no tenderness. There is no rebound, no guarding, no CVA tenderness and negative Murphy's sign.  Musculoskeletal:       Left upper extremity with tenderness and swelling over the dorsum of left wrist. Skin intact throughout with distal neurovascular intact. No tenderness over anatomical snuff box, elbow, shoulder and clavicle.no lower extremity  tenderness or swelling    Neurological: She is alert and oriented to person, place, and time. She has normal strength. No cranial nerve deficit or sensory deficit. GCS eye subscore is 4. GCS verbal subscore is 5. GCS motor subscore is 6.  Skin: Skin is warm and dry. No rash noted. She is not diaphoretic.  Psychiatric: Her speech is normal.       Cooperative and appropriate    ED Course  Procedures (including critical care time)  Labs Reviewed - No data to display Dg Forearm Left  10/21/2011  *RADIOLOGY REPORT*  Clinical Data: Right forearm pain after fall.  LEFT FOREARM - 2 VIEW  Comparison: None.  Findings: There is focal cortical irregularity along the lateral aspect of the distal left radius with suggestion of vague linear lucency across the left distal radial metaphysis.  Changes suggest nondisplaced fracture.  The ulna appears intact.  Diffuse bone demineralization.  IMPRESSION: Probable nondisplaced fracture of the distal left radial metaphysis.  Original Report Authenticated By: Marlon Pel, M.D.   Pain medications provided. Ice. X-ray obtained and reviewed as above. Patient placed in a splint and sling was provided. Distal neurovascular remains intact after volar splint was placed.   MDM   Fall with closed left distal radius fracture as above.no other evidence of trauma or injury by exam. Hand referral provided with pain medications. Family bedside state patient will have someone with her at all times, she is right-handed, and with help states that she is able to perform ADLs at home.        Sunnie Nielsen, MD 10/21/11 [phone]

## 2011-10-21 NOTE — ED Notes (Signed)
Pt's rings, bracelets and necklaces taken off and given to pt's grandson

## 2012-02-05 ENCOUNTER — Ambulatory Visit: Payer: Medicare Other | Attending: Family Medicine

## 2012-02-05 DIAGNOSIS — R262 Difficulty in walking, not elsewhere classified: Secondary | ICD-10-CM | POA: Insufficient documentation

## 2012-02-05 DIAGNOSIS — IMO0001 Reserved for inherently not codable concepts without codable children: Secondary | ICD-10-CM | POA: Insufficient documentation

## 2012-02-05 DIAGNOSIS — M6281 Muscle weakness (generalized): Secondary | ICD-10-CM | POA: Insufficient documentation

## 2012-02-05 DIAGNOSIS — R269 Unspecified abnormalities of gait and mobility: Secondary | ICD-10-CM | POA: Insufficient documentation

## 2012-02-13 ENCOUNTER — Ambulatory Visit: Payer: Medicare Other

## 2012-02-19 ENCOUNTER — Ambulatory Visit: Payer: Medicare Other | Attending: Family Medicine

## 2012-02-19 DIAGNOSIS — R269 Unspecified abnormalities of gait and mobility: Secondary | ICD-10-CM | POA: Insufficient documentation

## 2012-02-19 DIAGNOSIS — R262 Difficulty in walking, not elsewhere classified: Secondary | ICD-10-CM | POA: Insufficient documentation

## 2012-02-19 DIAGNOSIS — M6281 Muscle weakness (generalized): Secondary | ICD-10-CM | POA: Insufficient documentation

## 2012-02-19 DIAGNOSIS — IMO0001 Reserved for inherently not codable concepts without codable children: Secondary | ICD-10-CM | POA: Insufficient documentation

## 2012-02-26 ENCOUNTER — Ambulatory Visit: Payer: Medicare Other

## 2012-03-04 ENCOUNTER — Ambulatory Visit: Payer: Medicare Other

## 2012-04-27 ENCOUNTER — Observation Stay (HOSPITAL_COMMUNITY)
Admission: EM | Admit: 2012-04-27 | Discharge: 2012-04-30 | Disposition: A | Discharge status: Home / Self Care | Payer: Medicare Other | Attending: Internal Medicine | Admitting: Internal Medicine

## 2012-04-27 ENCOUNTER — Emergency Department (HOSPITAL_COMMUNITY): Payer: Medicare Other

## 2012-04-27 ENCOUNTER — Encounter (HOSPITAL_COMMUNITY): Payer: Self-pay

## 2012-04-27 DIAGNOSIS — S22000A Wedge compression fracture of unspecified thoracic vertebra, initial encounter for closed fracture: Secondary | ICD-10-CM

## 2012-04-27 DIAGNOSIS — Z720 Tobacco use: Secondary | ICD-10-CM | POA: Diagnosis present

## 2012-04-27 DIAGNOSIS — N189 Chronic kidney disease, unspecified: Secondary | ICD-10-CM | POA: Insufficient documentation

## 2012-04-27 DIAGNOSIS — M4850XA Collapsed vertebra, not elsewhere classified, site unspecified, initial encounter for fracture: Secondary | ICD-10-CM | POA: Diagnosis present

## 2012-04-27 DIAGNOSIS — F17200 Nicotine dependence, unspecified, uncomplicated: Secondary | ICD-10-CM | POA: Insufficient documentation

## 2012-04-27 DIAGNOSIS — I129 Hypertensive chronic kidney disease with stage 1 through stage 4 chronic kidney disease, or unspecified chronic kidney disease: Secondary | ICD-10-CM | POA: Insufficient documentation

## 2012-04-27 DIAGNOSIS — S22009A Unspecified fracture of unspecified thoracic vertebra, initial encounter for closed fracture: Secondary | ICD-10-CM | POA: Insufficient documentation

## 2012-04-27 DIAGNOSIS — I10 Essential (primary) hypertension: Secondary | ICD-10-CM | POA: Diagnosis present

## 2012-04-27 DIAGNOSIS — S32000A Wedge compression fracture of unspecified lumbar vertebra, initial encounter for closed fracture: Secondary | ICD-10-CM

## 2012-04-27 DIAGNOSIS — J9610 Chronic respiratory failure, unspecified whether with hypoxia or hypercapnia: Secondary | ICD-10-CM | POA: Insufficient documentation

## 2012-04-27 DIAGNOSIS — M545 Low back pain, unspecified: Principal | ICD-10-CM | POA: Insufficient documentation

## 2012-04-27 DIAGNOSIS — R262 Difficulty in walking, not elsewhere classified: Secondary | ICD-10-CM | POA: Insufficient documentation

## 2012-04-27 DIAGNOSIS — W19XXXA Unspecified fall, initial encounter: Secondary | ICD-10-CM | POA: Insufficient documentation

## 2012-04-27 HISTORY — DX: Panic disorder (episodic paroxysmal anxiety): F41.0

## 2012-04-27 HISTORY — DX: Dorsalgia, unspecified: M54.9

## 2012-04-27 HISTORY — DX: Other chronic pain: G89.29

## 2012-04-27 HISTORY — DX: Anxiety disorder, unspecified: F41.9

## 2012-04-27 HISTORY — DX: Chronic respiratory failure, unspecified whether with hypoxia or hypercapnia: J96.10

## 2012-04-27 HISTORY — DX: Chronic kidney disease, unspecified: N18.9

## 2012-04-27 LAB — CBC WITH DIFFERENTIAL/PLATELET
Basophils Relative: 0 % (ref 0–1)
Eosinophils Absolute: 0.1 10*3/uL (ref 0.0–0.7)
Hemoglobin: 13.2 g/dL (ref 12.0–15.0)
MCH: 30.6 pg (ref 26.0–34.0)
MCHC: 32.8 g/dL (ref 30.0–36.0)
Monocytes Relative: 8 % (ref 3–12)
Neutrophils Relative %: 82 % — ABNORMAL HIGH (ref 43–77)
RDW: 14 % (ref 11.5–15.5)

## 2012-04-27 LAB — BASIC METABOLIC PANEL
BUN: 22 mg/dL (ref 6–23)
CO2: 25 mEq/L (ref 19–32)
Chloride: 103 mEq/L (ref 96–112)
Creatinine, Ser: 0.96 mg/dL (ref 0.50–1.10)
GFR calc Af Amer: 62 mL/min — ABNORMAL LOW (ref >90)

## 2012-04-27 MED ORDER — ONDANSETRON HCL 4 MG PO TABS: 4.0000 mg | ORAL_TABLET | Freq: Four times a day (QID) | ORAL | Status: DC | PRN

## 2012-04-27 MED ORDER — ONDANSETRON HCL 4 MG/2ML IJ SOLN
4.0000 mg | Freq: Once | INTRAMUSCULAR | Status: AC
  Administered 2012-04-27: 4 mg via INTRAVENOUS
  Filled 2012-04-27: qty 2

## 2012-04-27 MED ORDER — DONEPEZIL HCL 5 MG PO TABS
5.0000 mg | ORAL_TABLET | Freq: Every day | ORAL | Status: DC
  Administered 2012-04-28 – 2012-04-30 (×3): 5 mg via ORAL
  Filled 2012-04-27 (×3): qty 1

## 2012-04-27 MED ORDER — ENOXAPARIN SODIUM 40 MG/0.4ML ~~LOC~~ SOLN
40.0000 mg | SUBCUTANEOUS | Status: DC
  Administered 2012-04-27 – 2012-04-28 (×2): 40 mg via SUBCUTANEOUS
  Filled 2012-04-27 (×2): qty 0.4

## 2012-04-27 MED ORDER — CITALOPRAM HYDROBROMIDE 20 MG PO TABS
20.0000 mg | ORAL_TABLET | Freq: Every day | ORAL | Status: DC
  Administered 2012-04-27 – 2012-04-30 (×4): 20 mg via ORAL
  Filled 2012-04-27 (×4): qty 1

## 2012-04-27 MED ORDER — METHOCARBAMOL 500 MG PO TABS: 500.0000 mg | ORAL_TABLET | Freq: Three times a day (TID) | ORAL | Status: DC | PRN

## 2012-04-27 MED ORDER — VITAMIN D (ERGOCALCIFEROL) 1.25 MG (50000 UNIT) PO CAPS
50000.0000 [IU] | ORAL_CAPSULE | ORAL | Status: DC
  Administered 2012-04-28: 50000 [IU] via ORAL
  Filled 2012-04-27: qty 1

## 2012-04-27 MED ORDER — LISINOPRIL 5 MG PO TABS
5.0000 mg | ORAL_TABLET | Freq: Every day | ORAL | Status: DC
  Administered 2012-04-27 – 2012-04-30 (×4): 5 mg via ORAL
  Filled 2012-04-27 (×4): qty 1

## 2012-04-27 MED ORDER — TRAZODONE HCL 50 MG PO TABS
50.0000 mg | ORAL_TABLET | Freq: Every evening | ORAL | Status: DC | PRN
  Administered 2012-04-28 – 2012-04-29 (×2): 50 mg via ORAL
  Filled 2012-04-27 (×2): qty 1

## 2012-04-27 MED ORDER — DOCUSATE SODIUM 100 MG PO CAPS
100.0000 mg | ORAL_CAPSULE | Freq: Two times a day (BID) | ORAL | Status: DC
  Administered 2012-04-27 – 2012-04-30 (×7): 100 mg via ORAL
  Filled 2012-04-27 (×7): qty 1

## 2012-04-27 MED ORDER — SODIUM CHLORIDE 0.9 % IV SOLN
INTRAVENOUS | Status: AC
  Administered 2012-04-27: 100 mL via INTRAVENOUS

## 2012-04-27 MED ORDER — ONDANSETRON HCL 4 MG/2ML IJ SOLN: 4.0000 mg | Freq: Four times a day (QID) | INTRAMUSCULAR | Status: DC | PRN

## 2012-04-27 MED ORDER — ADULT MULTIVITAMIN W/MINERALS CH
1.0000 | ORAL_TABLET | Freq: Every day | ORAL | Status: DC
  Administered 2012-04-27 – 2012-04-30 (×4): 1 via ORAL
  Filled 2012-04-27 (×4): qty 1

## 2012-04-27 MED ORDER — ACETAMINOPHEN 325 MG PO TABS
650.0000 mg | ORAL_TABLET | Freq: Three times a day (TID) | ORAL | Status: DC
  Administered 2012-04-27 – 2012-04-30 (×9): 650 mg via ORAL
  Filled 2012-04-27 (×9): qty 2

## 2012-04-27 MED ORDER — SODIUM CHLORIDE 0.9 % IV BOLUS (SEPSIS)
1000.0000 mL | Freq: Once | INTRAVENOUS | Status: AC
  Administered 2012-04-27: 1000 mL via INTRAVENOUS

## 2012-04-27 MED ORDER — OXYCODONE HCL 5 MG PO TABS
5.0000 mg | ORAL_TABLET | ORAL | Status: DC | PRN
  Administered 2012-04-28 – 2012-04-29 (×3): 5 mg via ORAL
  Filled 2012-04-27 (×3): qty 1

## 2012-04-27 MED ORDER — MORPHINE SULFATE 4 MG/ML IJ SOLN
4.0000 mg | Freq: Once | INTRAMUSCULAR | Status: AC
  Administered 2012-04-27: 4 mg via INTRAVENOUS
  Filled 2012-04-27: qty 1

## 2012-04-27 MED ORDER — FOLIC ACID 1 MG PO TABS
1.0000 mg | ORAL_TABLET | Freq: Every day | ORAL | Status: DC
  Administered 2012-04-27 – 2012-04-30 (×4): 1 mg via ORAL
  Filled 2012-04-27 (×4): qty 1

## 2012-04-27 MED ORDER — DOUBLE ANTIBIOTIC 500-10000 UNIT/GM EX OINT
1.0000 "application " | TOPICAL_OINTMENT | Freq: Every day | CUTANEOUS | Status: DC
  Administered 2012-04-27 – 2012-04-30 (×4): 1 via TOPICAL
  Filled 2012-04-27 (×4): qty 1

## 2012-04-27 MED ORDER — SENNA 8.6 MG PO TABS
1.0000 | ORAL_TABLET | Freq: Two times a day (BID) | ORAL | Status: DC
  Administered 2012-04-27 – 2012-04-30 (×7): 8.6 mg via ORAL
  Filled 2012-04-27 (×8): qty 1

## 2012-04-27 MED ORDER — FERROUS SULFATE 325 (65 FE) MG PO TABS
325.0000 mg | ORAL_TABLET | Freq: Every day | ORAL | Status: DC
  Administered 2012-04-28 – 2012-04-30 (×3): 325 mg via ORAL
  Filled 2012-04-27 (×3): qty 1

## 2012-04-27 MED ORDER — DIAZEPAM 2 MG PO TABS: 2.0000 mg | ORAL_TABLET | Freq: Every evening | ORAL | Status: DC | PRN

## 2012-04-27 NOTE — ED Provider Notes (Signed)
History   This chart was scribed for Jillian Hutching, MD by Charolett Bumpers, ER Scribe. The patient was seen in room APA06/[facility]06. Patient's care was started at 0902.   CSN: [id number]  Arrival date & time 04/27/12  7829   First MD Initiated Contact with Patient 04/27/12 0902      Chief Complaint  Patient presents with  . Fall     The history is provided by the patient. No language interpreter was used.   [name] is a 78 y.o. female who presents to the Emergency Department via EMS complaining of constant, moderate mid and lower back pain after a mechanical fall this morning. She states that she fell while walking to bathroom after losing her balance. She denies any dizziness or syncope prior to fall. She denies LOC or hitting her head. She describes the back pain as soreness and is aggravated with movement. She denies any hip pain, neck pain, headache, chest pain or abdominal pain. She denies any extremity pain. She reports a h/o fractures in her back and chronic back pain. She lives at home and is independent. Family reports a h/o falls and near falls.   PCP: Dr. Kevan Ny  Past Medical History  Diagnosis Date  . Hypertension   . Memory loss   . Anemia   . Chronic back pain   . Chronic kidney disease   . Anxiety   . Panic attack     Past Surgical History  Procedure Date  . Hemorrhoid surgery     No family history on file.  History  Substance Use Topics  . Smoking status: Current Every Day Smoker -- 1.0 packs/day    Types: Cigarettes  . Smokeless tobacco: Not on file  . Alcohol Use: No    OB History    Grav Para Term Preterm Abortions TAB SAB Ect Mult Living                  Review of Systems A complete 10 system review of systems was obtained and all systems are negative except as noted in the HPI and PMH.   Allergies  Biaxin; Erythromycin; Keflex; Macrodantin; Penicillins; and Sulfa antibiotics  Home Medications   Current Outpatient Rx  Name  Route   Sig  Dispense  Refill  . DIAZEPAM 5 MG PO TABS   Oral   Take 5 mg by mouth every 6 (six) hours as needed.         . DONEPEZIL HCL 10 MG PO TABS   Oral   Take 10 mg by mouth at bedtime as needed.         Marland Kitchen ESCITALOPRAM OXALATE 5 MG PO TABS   Oral   Take 5 mg by mouth daily.         Marland Kitchen FERROUS SULFATE 325 (65 FE) MG PO TABS   Oral   Take 325 mg by mouth daily with breakfast.         . FOLIC ACID 1 MG PO TABS   Oral   Take 1 mg by mouth daily.         Marland Kitchen LISINOPRIL 5 MG PO TABS   Oral   Take 5 mg by mouth daily.         . MULTI-VITAMIN/MINERALS PO TABS   Oral   Take 1 tablet by mouth daily.         Marland Kitchen VITAMIN D (ERGOCALCIFEROL) 50000 UNITS PO CAPS   Oral   Take 50,000 Units  by mouth every 7 (seven) days.           BP 153/86  Pulse 61  Temp 98 F (36.7 C) (Oral)  Resp 18  Ht 5\' 4"  (1.626 m)  Wt 135 lb 7 oz (61.434 kg)  BMI 23.25 kg/m2  SpO2 95%  Physical Exam  Nursing note and vitals reviewed. Constitutional: She is oriented to person, place, and time. She appears well-developed and well-nourished.  HENT:  Head: Normocephalic and atraumatic.  Eyes: Conjunctivae normal and EOM are normal. Pupils are equal, round, and reactive to light.  Neck: Normal range of motion. Neck supple.  Cardiovascular: Normal rate, regular rhythm and normal heart sounds.   No murmur heard. Pulmonary/Chest: Effort normal and breath sounds normal. No respiratory distress. She has no wheezes.  Abdominal: Soft. Bowel sounds are normal. She exhibits no distension. There is no tenderness.  Musculoskeletal: Normal range of motion. She exhibits tenderness. She exhibits no edema.       Diffuse thoracic and lumbar spinal tenderness.   Neurological: She is alert and oriented to person, place, and time.  Skin: Skin is warm and dry.  Psychiatric: She has a normal mood and affect.    ED Course  Procedures (including critical care time)  DIAGNOSTIC STUDIES: Oxygen Saturation is  95% on room air, adequate by my interpretation.    COORDINATION OF CARE:  09:40-Discussed planned course of treatment with the patient and family including x-rays of thoracic and lumbar spine, who are agreeable at this time.   Results for orders placed during the hospital encounter of 04/27/12  CBC WITH DIFFERENTIAL      Component Value Range   WBC 10.7 (*) 4.0 - 10.5 K/uL   RBC 4.32  3.87 - 5.11 MIL/uL   Hemoglobin 13.2  12.0 - 15.0 g/dL   HCT 09.8  11.9 - 14.7 %   MCV 93.3  78.0 - 100.0 fL   MCH 30.6  26.0 - 34.0 pg   MCHC 32.8  30.0 - 36.0 g/dL   RDW 82.9  56.2 - 13.0 %   Platelets 144 (*) 150 - 400 K/uL   Neutrophils Relative 82 (*) 43 - 77 %   Neutro Abs 8.8 (*) 1.7 - 7.7 K/uL   Lymphocytes Relative 9 (*) 12 - 46 %   Lymphs Abs 0.9  0.7 - 4.0 K/uL   Monocytes Relative 8  3 - 12 %   Monocytes Absolute 0.9  0.1 - 1.0 K/uL   Eosinophils Relative 1  0 - 5 %   Eosinophils Absolute 0.1  0.0 - 0.7 K/uL   Basophils Relative 0  0 - 1 %   Basophils Absolute 0.0  0.0 - 0.1 K/uL   WBC Morphology ATYPICAL LYMPHOCYTES     RBC Morphology SPECIMEN CHECKED FOR CLOTS     Smear Review PERFORMED    BASIC METABOLIC PANEL      Component Value Range   Sodium 139  135 - 145 mEq/L   Potassium 4.0  3.5 - 5.1 mEq/L   Chloride 103  96 - 112 mEq/L   CO2 25  19 - 32 mEq/L   Glucose, Bld 80  70 - 99 mg/dL   BUN 22  6 - 23 mg/dL   Creatinine, Ser 8.65  0.50 - 1.10 mg/dL   Calcium 9.6  8.4 - 78.4 mg/dL   GFR calc non Af Amer 53 (*) >90 mL/min   GFR calc Af Amer 62 (*) >90 mL/min  Dg Thoracic Spine 2 View  04/27/2012  *RADIOLOGY REPORT*  Clinical Data: Fall, back pain  THORACIC SPINE - 2 VIEW  Comparison: Chest radiographs dated 06/06/2011  Findings: Normal thoracic kyphosis.  Mild superior endplate compression deformity at T12, age indeterminate but new from 2012.  Vertebral body heights are otherwise maintained.  Mild multilevel degenerative changes.  Visualized lungs are essentially clear.   Moderate hiatal hernia.  IMPRESSION: Mild superior endplate compression deformity at T12, age indeterminate but new from 2012.  Correlate with the site of the patient's pain.   Original Report Authenticated By: Charline Bills, M.D.    Dg Lumbar Spine Complete  04/27/2012  *RADIOLOGY REPORT*  Clinical Data: Fall, back pain  LUMBAR SPINE - COMPLETE 4+ VIEW  Comparison: 10/09/2007  Findings: Five lumbar-type vertebral bodies.  Severe compression deformity at L1, unchanged.  Severe compression deformity at L2, progressed from 2009.  Visualized bony pelvis appears intact.  Vascular calcifications.  Moderate hiatal hernia.  IMPRESSION: Severe compression deformity at L2, progressed from 2009, but favored to be chronic.  Correlate with the site of the patient's pain.  Severe compression deformity at L1, chronic.   Original Report Authenticated By: Charline Bills, M.D.      No diagnosis found.   Date: 04/27/2012  Rate: 83  Rhythm: normal sinus rhythm  QRS Axis: right  Intervals: normal  ST/T Wave abnormalities: normal  Conduction Disutrbances:first-degree A-V block   Narrative Interpretation:   Old EKG Reviewed: none available   MDM  Status post fall today compression fractures at T12, L1, L2. Some are of indeterminate age. Patient is unable to ambulate and take care of herself. Admit.   I personally performed the services described in this documentation, which was scribed in my presence. The recorded information has been reviewed and is accurate.       Jillian Hutching, MD 04/27/12 1434

## 2012-04-27 NOTE — ED Notes (Signed)
Pt arrived from home by ems, lost balance and fell this am.  c-collar in place, denies loc.  H/o chronic back pain. Now c/o mid back pain

## 2012-04-27 NOTE — H&P (Addendum)
Hospital Admission Note Date: 04/27/2012  Patient name: [name] Medical record number: [id number] Date of birth: Dec 15, 1926 Age: 78 y.o. Gender: female PCP: Hollice Espy, MD  Attending physician: Christiane Ha, MD  Chief Complaint: back pain  History of Present Illness:  [name] is an 78 y.o. female who was brought to the emergency room via EMS after a fall. She tripped over some slippers and was unable to get up. Her son found her lying on her back and she had too much pain to movement. She is currently complaining of low back pain. She has a previous history of falls and vertebral compression fractures. She has an unsteady gait at baseline. She is supposed to use a rolling walker, but family have not yet picked it up. Imaging studies in the emergency room show a new T1 compression fracture compared to 2012. She was unable to get up and walk in the emergency room. She is widowed. She lives by herself and family members spend much of the day at her house to assist.  Past Medical History  Diagnosis Date  . Hypertension   . Memory loss   . Anemia   . Chronic back pain   . Chronic kidney disease   . Anxiety   . Panic attack     Meds: Prescriptions prior to admission  Medication Sig Dispense Refill  . cyanocobalamin (,VITAMIN B-12,) 1000 MCG/ML injection Inject 1,000 mcg into the muscle every 30 (thirty) days.      . diazepam (VALIUM) 5 MG tablet Take 5 mg by mouth at bedtime as needed. Sleep      . donepezil (ARICEPT) 5 MG tablet Take 5 mg by mouth at bedtime.      Marland Kitchen escitalopram (LEXAPRO) 5 MG tablet Take 5 mg by mouth daily.      . ferrous sulfate 325 (65 FE) MG tablet Take 325 mg by mouth daily with breakfast.      . folic acid (FOLVITE) 1 MG tablet Take 1 mg by mouth daily.      Marland Kitchen lisinopril (PRINIVIL,ZESTRIL) 5 MG tablet Take 5 mg by mouth daily.      . Multiple Vitamins-Minerals (MULTIVITAMIN WITH MINERALS) tablet Take 1 tablet by mouth daily.      .  polymixin-bacitracin (POLYSPORIN) 500-10000 UNIT/GM OINT ointment Apply 1 application topically daily.      . Vitamin D, Ergocalciferol, (DRISDOL) 50000 UNITS CAPS Take 50,000 Units by mouth every 7 (seven) days. Takes on Mondays        Allergies: Biaxin; Erythromycin; Keflex; Macrodantin; Penicillins; and Sulfa antibiotics History   Social History  . Marital Status: Married    Spouse Name: N/A    Number of Children: N/A  . Years of Education: N/A   Occupational History  . Not on file.   Social History Main Topics  . Smoking status: Current Every Day Smoker -- 1.0 packs/day    Types: Cigarettes  . Smokeless tobacco: Not on file  . Alcohol Use: No  . Drug Use: No  . Sexually Active: No   Other Topics Concern  . Not on file   Social History Narrative  . No narrative on file   No family history on file. Past Surgical History  Procedure Date  . Hemorrhoid surgery     Review of Systems: She had a recent excision of a skin cancer on her right wrist. Systems reviewed and as per HPI, otherwise negative.  Physical Exam: Blood pressure 148/75, pulse 75, temperature  98 F (36.7 C), temperature source Oral, resp. rate 17, height 5\' 4"  (1.626 m), weight 61.434 kg (135 lb 7 oz), SpO2 98.00%. BP 148/75  Pulse 75  Temp 98 F (36.7 C) (Oral)  Resp 17  Ht 5\' 4"  (1.626 m)  Wt 61.434 kg (135 lb 7 oz)  BMI 23.25 kg/m2  SpO2 98%  General Appearance:    asleep. Arousable. Oriented and appropriate.   Head:    Normocephalic, without obvious abnormality, atraumatic  Eyes:    PERRL, conjunctiva/corneas clear, EOM's intact, fundi    benign, both eyes  Ears:    Normal TM's and external ear canals, both ears  Nose:   Nares normal, septum midline, mucosa normal, no drainage    or sinus tenderness  Throat:   Lips, mucosa, and tongue normal; teeth and gums normal  Neck:   Supple, symmetrical, trachea midline, no adenopathy;    thyroid:  no enlargement/tenderness/nodules; no carotid    bruit or JVD  Back:     tender along the upper thoracic spine   Lungs:     Clear to auscultation bilaterally, respirations unlabored  Chest Wall:    No tenderness or deformity   Heart:    Regular rate and rhythm, S1 and S2 normal, no murmur, rub   or gallop     Abdomen:     Soft, non-tender, bowel sounds active all four quadrants,    no masses, no organomegaly  Genitalia:   deferred  Rectal:   deferred  Extremities:   Extremities normal, atraumatic, no cyanosis or edema  Pulses:   2+ and symmetric all extremities  Skin:   right wrist with 2 cm ulceration from skin cancer excision. No drainage odor or cellulitis   Lymph nodes:   Cervical, supraclavicular, and axillary nodes normal  Neurologic:   CNII-XII intact, normal strength, sensation and reflexes    throughout    Psychiatric: Normal affect. Cooperative.  Lab results: Basic Metabolic Panel:  Basename 04/27/12 1102  NA 139  K 4.0  CL 103  CO2 25  GLUCOSE 80  BUN 22  CREATININE 0.96  CALCIUM 9.6  MG --  PHOS --   Liver Function Tests: No results found for this basename: AST:2,ALT:2,ALKPHOS:2,BILITOT:2,PROT:2,ALBUMIN:2 in the last 72 hours No results found for this basename: LIPASE:2,AMYLASE:2 in the last 72 hours No results found for this basename: AMMONIA:2 in the last 72 hours CBC:  Basename 04/27/12 1102  WBC 10.7*  NEUTROABS 8.8*  HGB 13.2  HCT 40.3  MCV 93.3  PLT 144*   Imaging results:  Dg Thoracic Spine 2 View  04/27/2012  *RADIOLOGY REPORT*  Clinical Data: Fall, back pain  THORACIC SPINE - 2 VIEW  Comparison: Chest radiographs dated 06/06/2011  Findings: Normal thoracic kyphosis.  Mild superior endplate compression deformity at T12, age indeterminate but new from 2012.  Vertebral body heights are otherwise maintained.  Mild multilevel degenerative changes.  Visualized lungs are essentially clear.  Moderate hiatal hernia.  IMPRESSION: Mild superior endplate compression deformity at T12, age indeterminate but  new from 2012.  Correlate with the site of the patient's pain.   Original Report Authenticated By: Charline Bills, M.D.    Dg Lumbar Spine Complete  04/27/2012  *RADIOLOGY REPORT*  Clinical Data: Fall, back pain  LUMBAR SPINE - COMPLETE 4+ VIEW  Comparison: 10/09/2007  Findings: Five lumbar-type vertebral bodies.  Severe compression deformity at L1, unchanged.  Severe compression deformity at L2, progressed from 2009.  Visualized bony pelvis appears intact.  Vascular calcifications.  Moderate hiatal hernia.  IMPRESSION: Severe compression deformity at L2, progressed from 2009, but favored to be chronic.  Correlate with the site of the patient's pain.  Severe compression deformity at L1, chronic.   Original Report Authenticated By: Charline Bills, M.D.     Assessment & Plan: Principal Problem:  *Vertebral compression fracture, T12 Active Problems:  Difficulty walking  Low back pain, likely muscle spasm  Fall  Benign hypertension  Tobacco use  Patient will be placed on observation. Consult physical therapy and occupational therapy. She will get scheduled Tylenol and oxycodone as needed. Stool softeners. Resume home medications. She may require placement.  DVT prophylaxis.  Lakeia Bradshaw L 04/27/2012, 3:37 PM

## 2012-04-27 NOTE — ED Notes (Signed)
Pt arrived by ems from home, lost balance and fell, at ems arrival to home pt was having mid/low back pain, at arrival to ems was pain free. Denies any loc. c-collar in place.  Pt alert and able to answer all ?'s.  Family at bedside.

## 2012-04-28 ENCOUNTER — Observation Stay (HOSPITAL_COMMUNITY): Payer: Medicare Other

## 2012-04-28 MED ORDER — GADOBENATE DIMEGLUMINE 529 MG/ML IV SOLN
12.0000 mL | Freq: Once | INTRAVENOUS | Status: AC | PRN
  Administered 2012-04-28: 12 mL via INTRAVENOUS

## 2012-04-28 NOTE — Progress Notes (Signed)
     Subjective: This lady was admitted with fall and has sustained a T12 compression fracture. She does not remember how she fell. She does have a history of memory loss and probable dementia.           Physical Exam: Blood pressure 145/77, pulse 63, temperature 98 F (36.7 C), temperature source Oral, resp. rate 16, height 5\' 4"  (1.626 m), weight 61.43 kg (135 lb 6.9 oz), SpO2 97.00%. She looks systemically well. She appears to be alert and orientated at the present time. Heart sounds are present and normal. Lung fields are clear. There are no focal neurological signs.   Investigations:     Basic Metabolic Panel:  Basename 04/27/12 1102  NA 139  K 4.0  CL 103  CO2 25  GLUCOSE 80  BUN 22  CREATININE 0.96  CALCIUM 9.6  MG --  PHOS --      CBC:  Basename 04/27/12 1102  WBC 10.7*  NEUTROABS 8.8*  HGB 13.2  HCT 40.3  MCV 93.3  PLT 144*    Dg Thoracic Spine 2 View  04/27/2012  *RADIOLOGY REPORT*  Clinical Data: Fall, back pain  THORACIC SPINE - 2 VIEW  Comparison: Chest radiographs dated 06/06/2011  Findings: Normal thoracic kyphosis.  Mild superior endplate compression deformity at T12, age indeterminate but new from 2012.  Vertebral body heights are otherwise maintained.  Mild multilevel degenerative changes.  Visualized lungs are essentially clear.  Moderate hiatal hernia.  IMPRESSION: Mild superior endplate compression deformity at T12, age indeterminate but new from 2012.  Correlate with the site of the patient's pain.   Original Report Authenticated By: Charline Bills, M.D.    Dg Lumbar Spine Complete  04/27/2012  *RADIOLOGY REPORT*  Clinical Data: Fall, back pain  LUMBAR SPINE - COMPLETE 4+ VIEW  Comparison: 10/09/2007  Findings: Five lumbar-type vertebral bodies.  Severe compression deformity at L1, unchanged.  Severe compression deformity at L2, progressed from 2009.  Visualized bony pelvis appears intact.  Vascular calcifications.  Moderate hiatal  hernia.  IMPRESSION: Severe compression deformity at L2, progressed from 2009, but favored to be chronic.  Correlate with the site of the patient's pain.  Severe compression deformity at L1, chronic.   Original Report Authenticated By: Charline Bills, M.D.       Medications: I have reviewed the patient's current medications.  Impression: 1. Mild T12 compression fracture. 2. Fall/memory problems/gait instability. 3. Hypertension.     Plan: 1. MRI brain scan to make sure there is nothing more sinister causing her falls. I think she likely has baseline dementia. 2. Appreciate physical therapy evaluation. I think she is not really safe to go home and would probably benefit from skilled nursing facility placement for rehabilitation. 3. Patient is absolutely refusing the possibility of interventional radiology during kyphoplasty/vertebroplasty. Will use a brace that physical therapy has suggested.     LOS: 1 day   Wilson Singer Pager [phone]  04/28/2012, 10:58 AM

## 2012-04-28 NOTE — Evaluation (Signed)
Physical Therapy Evaluation Patient Details Name: [name] MRN: [id number] DOB: 11-16-1926 Today's Date: 04/28/2012 Time: 4098-1191 PT Time Calculation (min): 86 min  PT Assessment / Plan / Recommendation Clinical Impression  Pt Was seen for eval.  She lives alone and has fallen frequently.  She has had previous compression fxs and her thoracic spine is severely kyphotic.  She was fitted with a Cash brace and instructed in its' application.  She needed mod assist to transfer OOB and was only able to ambulate 10' with a walker.She did describe dizziness during gait (on RA) and we sat her down.  O2 sat had gone down to 75%, so O2 was reapplied and O2 rebounded to 93%.  She ambulated again with O2 and she had no dizziness.  She made the comment that this is what has happened at home before she falls.  She had significant antalgia with all mobility and I think that she would benefit from short term SNF before return home.    PT Assessment  Patient needs continued PT services    Follow Up Recommendations  SNF    Does the patient have the potential to tolerate intense rehabilitation      Barriers to Discharge Decreased caregiver support      Equipment Recommendations  Rolling walker with 5" wheels    Recommendations for Other Services OT consult   Frequency Min 5X/week    Precautions / Restrictions Precautions Precautions: Fall Restrictions Weight Bearing Restrictions: No   Pertinent Vitals/Pain       Mobility  Bed Mobility Bed Mobility: Supine to Sit;Sit to Supine Supine to Sit: 3: Mod assist;HOB flat Sit to Supine: 3: Mod assist Details for Bed Mobility Assistance: Cash brace fitted prior to mobilizing pt...very slow and antalgicv movements Transfers Transfers: Sit to Stand;Stand to Sit Sit to Stand: 3: Mod assist;With upper extremity assist Stand to Sit: 3: Mod assist;To bed Ambulation/Gait Ambulation/Gait Assistance: 4: Min assist Ambulation Distance (Feet): 10 Feet  (x2) Assistive device: Rolling walker Gait Pattern: Within Functional Limits;Trunk flexed Gait velocity: slow, labored General Gait Details: pt did not have O2 on during gait...O2 sat decreased to 75% and pt described being dizzy after walking 10'... she ambulated again with O2 and had no dizziness Stairs: No Wheelchair Mobility Wheelchair Mobility: No    Shoulder Instructions     Exercises     PT Diagnosis: Difficulty walking;Generalized weakness;Acute pain  PT Problem List: Decreased activity tolerance;Decreased mobility;Cardiopulmonary status limiting activity;Pain;Decreased knowledge of precautions;Decreased safety awareness PT Treatment Interventions: DME instruction;Gait training;Functional mobility training;Therapeutic activities;Therapeutic exercise;Patient/family education   PT Goals Acute Rehab PT Goals PT Goal Formulation: With patient Time For Goal Achievement: 05/12/12 Potential to Achieve Goals: Good Pt will go Supine/Side to Sit: with min assist;with HOB 0 degrees PT Goal: Supine/Side to Sit - Progress: Goal set today Pt will go Sit to Supine/Side: with min assist;with HOB 0 degrees PT Goal: Sit to Supine/Side - Progress: Goal set today Pt will go Sit to Stand: with min assist;with upper extremity assist PT Goal: Sit to Stand - Progress: Goal set today Pt will go Stand to Sit: with min assist;with upper extremity assist PT Goal: Stand to Sit - Progress: Goal set today Pt will Ambulate: 16 - 50 feet;with supervision;with rolling walker PT Goal: Ambulate - Progress: Goal set today  Visit Information  Last PT Received On: 04/28/12    Subjective Data  Subjective: I have a walker on order Patient Stated Goal: none stated   Prior Functioning  Home Living Lives With: Alone Available Help at Discharge: Family;Available PRN/intermittently Type of Home: House Home Access: Stairs to enter [employer] of Steps: 4 Entrance Stairs-Rails: Right;Left;Can reach  both Home Layout: One level Bathroom Shower/Tub: Engineer, manufacturing systems: Standard Home Adaptive Equipment: Bedside commode/3-in-1 Additional Comments: pt "furniture walks" at home Prior Function Level of Independence: Needs assistance Needs Assistance: Bathing;Light Housekeeping Able to Take Stairs?: Yes Driving: No Vocation: Retired Comments: daughter does all shopping, etc. for pt    Cognition  Overall Cognitive Status: Appears within functional limits for tasks assessed/performed Arousal/Alertness: Awake/alert Orientation Level: Appears intact for tasks assessed Behavior During Session: [facility] for tasks performed    Extremity/Trunk Assessment Right Lower Extremity Assessment RLE ROM/Strength/Tone: WFL for tasks assessed RLE Sensation: WFL - Light Touch RLE Coordination: WFL - gross motor Left Lower Extremity Assessment LLE ROM/Strength/Tone: WFL for tasks assessed LLE Sensation: WFL - Light Touch LLE Coordination: WFL - gross motor Trunk Assessment Trunk Assessment: Kyphotic (SEVERE)   Balance Balance Balance Assessed: No  End of Session PT - End of Session Equipment Utilized During Treatment: Gait belt Activity Tolerance: Patient tolerated treatment well Patient left: in chair (pt going to MRI)  GP     Myrlene Broker L 04/28/2012, 11:43 AM

## 2012-04-28 NOTE — Progress Notes (Signed)
UR Chart Review Completed  

## 2012-04-28 NOTE — Evaluation (Signed)
Occupational Therapy Evaluation Patient Details Name: [name] MRN: [id number] DOB: 08/29/1926 Today's Date: 04/28/2012 Time: 4098-1191 OT Time Calculation (min): 20 min  OT Assessment / Plan / Recommendation Clinical Impression  Patient is a 78 y/o female presenting to acute OT with deficits below. Patient will benefit from OT services to increase ADL perforamnce, functional transfers, and Bil UE strength and endurance.    OT Assessment  Patient needs continued OT Services    Follow Up Recommendations  SNF       Equipment Recommendations  None recommended by OT       Frequency  Min 2X/week    Precautions / Restrictions Precautions Precautions: Fall   Pertinent Vitals/Pain No complaints.    ADL  Lower Body Dressing: Performed;Moderate assistance Where Assessed - Lower Body Dressing: Supported sit to stand Toilet Transfer: Performed;Min Pension scheme manager Method: Surveyor, minerals: Regular height toilet;Grab bars Toileting - Architect and Hygiene: Performed;Minimal assistance Where Assessed - Engineer, mining and Hygiene: Standing Equipment Used: Rolling walker Transfers/Ambulation Related to ADLs: Patient transfers at a [employer] level with RW.    OT Diagnosis: Generalized weakness  OT Problem List: Decreased strength;Decreased activity tolerance;Impaired balance (sitting and/or standing);Decreased safety awareness;Decreased knowledge of use of DME or AE OT Treatment Interventions: Self-care/ADL training;Therapeutic exercise;Energy conservation;Therapeutic activities;DME and/or AE instruction;Patient/family education;Balance training   OT Goals Acute Rehab OT Goals OT Goal Formulation: With patient Time For Goal Achievement: 05/12/12 Potential to Achieve Goals: Good ADL Goals Pt Will Perform Grooming: with modified independence;Sitting, edge of bed ADL Goal: Grooming - Progress: Goal set today Pt Will Perform  Upper Body Bathing: with set-up;Sitting, edge of bed ADL Goal: Upper Body Bathing - Progress: Goal set today Pt Will Perform Lower Body Bathing: with min assist;Sitting, edge of bed ADL Goal: Lower Body Bathing - Progress: Goal set today Pt Will Perform Upper Body Dressing: with set-up;Sitting, bed ADL Goal: Upper Body Dressing - Progress: Goal set today Pt Will Perform Lower Body Dressing: with min assist;Sit to stand from bed ADL Goal: Lower Body Dressing - Progress: Goal set today Pt Will Transfer to Toilet: with supervision;with DME;Regular height toilet ADL Goal: Toilet Transfer - Progress: Goal set today Pt Will Perform Toileting - Clothing Manipulation: with supervision;Sitting on 3-in-1 or toilet ADL Goal: Toileting - Clothing Manipulation - Progress: Goal set today Pt Will Perform Toileting - Hygiene: with supervision;Leaning right and/or left on 3-in-1/toilet;Sit to stand from 3-in-1/toilet ADL Goal: Toileting - Hygiene - Progress: Goal set today Arm Goals Pt Will Complete Theraband Exer: with supervision, verbal cues required/provided;to increase strength;Bilateral upper extremities;1 set;10 reps;Level 1 Theraband Arm Goal: Theraband Exercises - Progress: Goal set today Miscellaneous OT Goals Miscellaneous OT Goal #1: Patient will donn/doff Cash brace with Min Assist. OT Goal: Miscellaneous Goal #1 - Progress: Goal set today  Visit Information  Last OT Received On: 04/28/12 Assistance Needed: +1    Subjective Data  Subjective: "I've been up walking today." Patient Stated Goal: To go home.   Prior Functioning     Home Living Lives With: Alone Available Help at Discharge: Family;Available 24 hours/day Type of Home: House Home Access: Stairs to enter [employer] of Steps: 4 Entrance Stairs-Rails: Right;Left Home Layout: One level Bathroom Shower/Tub: Engineer, manufacturing systems: Standard Home Adaptive Equipment: Bedside commode/3-in-1;Shower chair  with back Additional Comments: Furniture walks at home. Prior Function Level of Independence: Needs assistance Needs Assistance: Bathing;Light Housekeeping Bath: Moderate Light Housekeeping: Total Able to Take Stairs?: Yes  Driving: No Vocation: Retired Comments: daughter does shopping.            Cognition  Overall Cognitive Status: Appears within functional limits for tasks assessed/performed Arousal/Alertness: Lethargic Orientation Level: Appears intact for tasks assessed Behavior During Session: [facility] for tasks performed Cognition - Other Comments: Pt was sleepy during eval.    Extremity/Trunk Assessment Right Upper Extremity Assessment RUE ROM/Strength/Tone: Deficits RUE ROM/Strength/Tone Deficits: MMT: 3/5 Left Upper Extremity Assessment LUE ROM/Strength/Tone: Deficits LUE ROM/Strength/Tone Deficits: MMT; 3/5     Mobility Bed Mobility Bed Mobility: Supine to Sit Supine to Sit: 3: Mod assist;HOB flat Sit to Supine: 3: Mod assist Details for Bed Mobility Assistance: Cash brace fitted prior to mobilizing pt...very slow and antalgicv movements Transfers Transfers: Sit to Stand;Stand to Sit Sit to Stand: 4: Min assist;With upper extremity assist;From bed Stand to Sit: 4: Min assist;With upper extremity assist;To toilet        Exercise Other Exercises Other Exercises: Educated daughter-in-law on Cash brace donning/doffing, care, and use.      End of Session OT - End of Session Equipment Utilized During Treatment: Back brace;Other (comment) (rolling walker) Activity Tolerance: Patient limited by fatigue;Patient tolerated treatment well Patient left: in chair;with call bell/phone within reach;with bed alarm set;with family/visitor present  GO Functional Assessment Tool Used: observation Functional Limitation: Self care Self Care Current Status (Z6109): At least 40 percent but less than 60 percent impaired, limited or restricted Self Care Goal Status (U0454): At  least 20 percent but less than 40 percent impaired, limited or restricted   Limmie Patricia, OTR/L 04/28/2012, 3:14 PM

## 2012-04-28 NOTE — Clinical Social Work Psychosocial (Signed)
Clinical Social Work Department BRIEF PSYCHOSOCIAL ASSESSMENT 04/28/2012  Patient:  [name], [name]     Account Number:  [id number]     Admit date:  04/27/2012  Clinical Social Worker:  Nancie Neas  Date/Time:  04/28/2012 04:10 PM  Referred by:  Physician  Date Referred:  04/28/2012 Referred for  SNF Placement   Other Referral:   Interview type:  Patient Other interview type:   and daughter-in-law    PSYCHOSOCIAL DATA Living Status:  ALONE Admitted from facility:   Level of care:   Primary support name:  Bruce Primary support relationship to patient:  CHILD, ADULT Degree of support available:   supportive    CURRENT CONCERNS Current Concerns  Post-Acute Placement   Other Concerns:    SOCIAL WORK ASSESSMENT / PLAN CSW met with pt and pt's daughter-in-law following referral from PT for SNF. Pt reports she lives alone, but her daughter-in-law is there some of the day providing assistance. Her son and daughter alternate weeks on coming by in the evenings to provide her dinner and help as needed. Pt tripped and fell, and has compression fracture. She was fitted for a cash brace today with PT. Pt's daughter-in-law was not present for PT evaluation. CSW shared how pt did with PT and she plans to discuss with pt's son and daughter this evening. Pt would like to return home but is aware she will need more assistance than before. CSW discussed placement process, including need for authorization and copays. CSW also left [facility] in room as referral was received for this. Pt said she would look over and notify CSW if interested in completing.   Assessment/plan status:  Psychosocial Support/Ongoing Assessment of Needs Other assessment/ plan:   Information/referral to community resources:   SNF list    PATIENT'S/FAMILY'S RESPONSE TO PLAN OF CARE: CSW will follow up in AM to discuss d/c planning further as pt and family want to discuss options prior to making decision.         Derenda Fennel, [place] [phone]

## 2012-04-28 NOTE — Care Management Note (Signed)
    Page 1 of 2   04/30/2012     11:22:35 AM   CARE MANAGEMENT NOTE 04/30/2012  Patient:  [name], [name]   Account Number:  [id number]  Date Initiated:  04/28/2012  Documentation initiated by:  Rosemary Holms  Subjective/Objective Assessment:   Pt admitted from home where she lives alone. Pt states her that PTA, her Daughter in Law stays with her most of the day. Her name is Kathie Rhodes. She also has a daughter named Kathie Rhodes.     Action/Plan:   The family is going to discuss pt's disposition tonight. 11/18. Although PT recommended SNF, Daughter in [place] does not think they can financially afford this option.   Anticipated DC Date:  04/30/2012   Anticipated DC Plan:  HOME W HOME HEALTH SERVICES  In-house referral  Clinical Social Worker      DC Planning Services  CM consult      Choice offered to / List presented to:     DME arranged  [facility]  WALKER - ROLLING        HH arranged  HH-1 RN  HH-10 DISEASE MANAGEMENT  HH-2 PT  HH-3 OT  HH-4 NURSE'S AIDE      HH agency  [facility]   Status of service:  Completed, signed off Medicare Important Message given?   (If response is "NO", the following Medicare IM given date fields will be blank) Date Medicare IM given:   Date Additional Medicare IM given:    Discharge Disposition:  HOME W HOME HEALTH SERVICES  Per UR Regulation:    If discussed at Long Length of Stay Meetings, dates discussed:    Comments:  04/30/12 1110 Hilmar Moldovan RN BSN CM Pt DCing home with [facility] and DME. O2 and rolling walker with bench seat to be deliver to hospital.  04/29/12 1315 Meerab Maselli Leanord Hawking RN BSN CM Spoke with pt, son, daughter in law and called daughter Eunice Blase. [facility] RN, PT and OT. Declined SW. Bonita Quin with [facility] notified. Per Daughter, they would like to get a bench walker. CM referred them to [facility] since they have a Rx from Dr. Kevan Ny. CM glad to assist with Rolling Walker but based on PT report, would not require a bench seat  rolling walker.  04/28/12 1530 Ladislav Caselli Leanord Hawking RN BNS CM

## 2012-04-29 ENCOUNTER — Observation Stay (HOSPITAL_COMMUNITY): Payer: Medicare Other

## 2012-04-29 DIAGNOSIS — S32009A Unspecified fracture of unspecified lumbar vertebra, initial encounter for closed fracture: Secondary | ICD-10-CM

## 2012-04-29 MED ORDER — ENOXAPARIN SODIUM 40 MG/0.4ML ~~LOC~~ SOLN
40.0000 mg | SUBCUTANEOUS | Status: DC
  Administered 2012-04-29: 40 mg via SUBCUTANEOUS
  Filled 2012-04-29: qty 0.4

## 2012-04-29 MED ORDER — VANCOMYCIN HCL IN DEXTROSE 1-5 GM/200ML-% IV SOLN
1000.0000 mg | INTRAVENOUS | Status: DC
  Filled 2012-04-29: qty 200

## 2012-04-29 NOTE — Progress Notes (Signed)
Occupational Therapy Treatment Patient Details Name: CASSIDIE VEIGA MRN: [id number] DOB: [birth year] Today's Date: 04/29/2012 Time: 4098-1191 OT Time Calculation (min): 39 min  OT Assessment / Plan / Recommendation Comments on Treatment Session Patient agreeable to participate in tx session. Donned Cash brace with Min Assist and Max vc's. Performed toilet transfer and toileting and was left in recliner with nursing in the room. Patient was fatigued end of tx session.    Follow Up Recommendations  Home health OT       Equipment Recommendations  None recommended by OT       Frequency   2x/week  Plan Discharge plan needs to be updated;Frequency remains appropriate    Precautions / Restrictions Precautions Precautions: Fall   Pertinent Vitals/Pain No complaints.    ADL  Grooming: Performed;Brushing hair;Wash/dry hands;Supervision/safety Where Assessed - Grooming: Supported sitting;Supported standing Upper Body Dressing: Performed;Minimal assistance Where Assessed - Upper Body Dressing: Supported sitting Lower Body Dressing: Performed;Minimal assistance Where Assessed - Lower Body Dressing: Unsupported sitting Toilet Transfer: Performed;Supervision/safety Toilet Transfer Method: Stand pivot Acupuncturist: Regular height toilet;Grab bars Toileting - Clothing Manipulation and Hygiene: Performed;Set up Where Assessed - Toileting Clothing Manipulation and Hygiene: Sit on 3-in-1 or toilet Equipment Used: Rolling walker Transfers/Ambulation Related to ADLs: Patient transferred at Supervision level. ADL Comments: patient donned cash brace with Min Assist and max vc's.      OT Goals ADL Goals Pt Will Perform Grooming: with modified independence;Sitting, edge of bed ADL Goal: Grooming - Progress: Progressing toward goals Pt Will Perform Upper Body Dressing: with set-up;Sitting, bed ADL Goal: Upper Body Dressing - Progress: Progressing toward goals Pt Will Perform Lower  Body Dressing: with min assist;Sit to stand from bed ADL Goal: Lower Body Dressing - Progress: Met Pt Will Transfer to Toilet: with supervision;with DME;Regular height toilet ADL Goal: Toilet Transfer - Progress: Met Pt Will Perform Toileting - Clothing Manipulation: Sitting on 3-in-1 or toilet ADL Goal: Toileting - Clothing Manipulation - Progress: Met Pt Will Perform Toileting - Hygiene: with supervision;Leaning right and/or left on 3-in-1/toilet;Sit to stand from 3-in-1/toilet ADL Goal: Toileting - Hygiene - Progress: Met Miscellaneous OT Goals Miscellaneous OT Goal #1: Patient will donn/doff Cash brace with Min Assist.  OT Goal: Miscellaneous Goal #1 - Progress: Met  Visit Information  Last OT Received On: 04/29/12 Assistance Needed: +1    Subjective Data  Subjective: "I want to go home not to a nursing home." Patient Stated Goal: To go home.         Mobility  Shoulder Instructions Bed Mobility Supine to Sit: 5: Supervision;With rails;HOB elevated Transfers Transfers: Sit to Stand;Stand to Sit Sit to Stand: 5: Supervision;From bed;With upper extremity assist Stand to Sit: 5: Supervision;To toilet;With upper extremity assist             End of Session OT - End of Session Equipment Utilized During Treatment: Back brace;Other (comment) (rolling walker) Activity Tolerance: Patient tolerated treatment well Patient left: in chair;with call bell/phone within reach;with chair alarm set;with nursing in room    Limmie Patricia, OTR/L 04/29/2012, 3:41 PM

## 2012-04-29 NOTE — Progress Notes (Signed)
Patient and family refused to have Kyphoplasty procedure done tomorrow 04/30/2012. Dr. Karilyn Cota is aware.

## 2012-04-29 NOTE — Progress Notes (Signed)
     Subjective: This lady is still appears to be in some pain, she also is a little bit more confused today.           Physical Exam: Blood pressure 153/75, pulse 68, temperature 98.3 F (36.8 C), temperature source Oral, resp. rate 18, height 5\' 4"  (1.626 m), weight 61.43 kg (135 lb 6.9 oz), SpO2 99.00%. She looks systemically well. She appears to be alert and orientated at the present time. Heart sounds are present and normal. Lung fields are clear. There are no focal neurological signs.   Investigations:     Basic Metabolic Panel:  Basename 04/27/12 1102  NA 139  K 4.0  CL 103  CO2 25  GLUCOSE 80  BUN 22  CREATININE 0.96  CALCIUM 9.6  MG --  PHOS --      CBC:  Basename 04/27/12 1102  WBC 10.7*  NEUTROABS 8.8*  HGB 13.2  HCT 40.3  MCV 93.3  PLT 144*    Dg Thoracic Spine 2 View  04/27/2012  *RADIOLOGY REPORT*  Clinical Data: Fall, back pain  THORACIC SPINE - 2 VIEW  Comparison: Chest radiographs dated 06/06/2011  Findings: Normal thoracic kyphosis.  Mild superior endplate compression deformity at T12, age indeterminate but new from 2012.  Vertebral body heights are otherwise maintained.  Mild multilevel degenerative changes.  Visualized lungs are essentially clear.  Moderate hiatal hernia.  IMPRESSION: Mild superior endplate compression deformity at T12, age indeterminate but new from 2012.  Correlate with the site of the patient's pain.   Original Report Authenticated By: Charline Bills, M.D.    Dg Lumbar Spine Complete  04/27/2012  *RADIOLOGY REPORT*  Clinical Data: Fall, back pain  LUMBAR SPINE - COMPLETE 4+ VIEW  Comparison: 10/09/2007  Findings: Five lumbar-type vertebral bodies.  Severe compression deformity at L1, unchanged.  Severe compression deformity at L2, progressed from 2009.  Visualized bony pelvis appears intact.  Vascular calcifications.  Moderate hiatal hernia.  IMPRESSION: Severe compression deformity at L2, progressed from 2009, but  favored to be chronic.  Correlate with the site of the patient's pain.  Severe compression deformity at L1, chronic.   Original Report Authenticated By: Charline Bills, M.D.    Mr Laqueta Jean Wo Contrast  04/28/2012  *RADIOLOGY REPORT*  Clinical Data: Altered mental status.  Confusion, falls  MRI HEAD WITHOUT AND WITH CONTRAST  Technique:  Multiplanar, multiecho pulse sequences of the brain and surrounding structures were obtained according to standard protocol without and with intravenous contrast  Contrast: 12mL MULTIHANCE GADOBENATE DIMEGLUMINE 529 MG/ML IV SOLN  Comparison: CT head 09/12/2007  Findings: Image quality degraded by motion.  Age appropriate atrophy.  Mild chronic ischemic changes in the white matter.  This has progressed since the MRI of 08/07/2006.  No acute infarct.  Negative for hemorrhage or mass.  No fluid collection.  Paranasal sinuses are clear.  IMPRESSION: Atrophy and minimal chronic microvascular ischemic change in the white matter.  No acute abnormality.   Original Report Authenticated By: Janeece Riggers, M.D.       Medications: I have reviewed the patient's current medications.  Impression: 1. Mild T12, possibly acute L1  compression fractures. 2. Fall/memory problems/gait instability. 3. Hypertension.     Plan: 1. I've spoken with interventional radiology and he has suggested MRI thoraco- lumbar spine.     LOS: 2 days   Wilson Singer Pager [phone]  04/29/2012, 9:51 AM

## 2012-04-29 NOTE — Progress Notes (Signed)
Patient ID: [name], female   DOB: Apr 06, 1927, 78 y.o.   MRN: [id number] Request received for T12 VP/KP on pt with hx back pain , recent fall and finding of acute T12 fracture on MRI. Imaging studies were reviewed by Dr. Corliss Skains and he feels pt is candidate for T12 KP. We will tent schedule procedure for 11/20 at [facility] pending insurance precert. Case has been d/w Dr. Karilyn Cota and pt's nurse aware of above.

## 2012-04-29 NOTE — Clinical Social Work Note (Signed)
CSW met with pt, pt's son and daughter-in-law at bedside. Pt had just completed treatment with PT and did much better today, ambulating 150'. Recommendation is now for home health. CSW discussed option of ALF but family is not interested due to private pay and they state pt does not qualify for Medicaid. They would like to speak to CM about private duty care, equipment, and home health needs. CSW notified MD and will sign off.  Derenda Fennel, [place] [phone]

## 2012-04-29 NOTE — Progress Notes (Signed)
Physical Therapy Treatment Patient Details Name: [name] MRN: [id number] DOB: 02-09-1927 Today's Date: 04/29/2012 Time: 7829-5621 PT Time Calculation (min): 41 min  PT Assessment / Plan / Recommendation Comments on Treatment Session  Pt has improved markedly today regarding decreased antalgia and increased gait endurance.  Only min guard assist to transfer OOB and ambulate 150' with walker.  Pt needs full assist to don the Cash brace and does not like to wear it (although she does report that it makes her back feel stronger).  If she can have max assist at home, she should be  able to transition to home at d/c.    Follow Up Recommendations  Home health PT;Supervision - Intermittent     Does the patient have the potential to tolerate intense rehabilitation     Barriers to Discharge        Equipment Recommendations       Recommendations for Other Services    Frequency     Plan Discharge plan needs to be updated;Frequency remains appropriate    Precautions / Restrictions     Pertinent Vitals/Pain     Mobility  Bed Mobility Bed Mobility: Left Sidelying to Sit Left Sidelying to Sit: 4: Min guard;HOB flat Transfers Sit to Stand: 4: Min guard;With upper extremity assist;From bed Stand to Sit: 4: Min guard;With upper extremity assist;To chair/3-in-1 Ambulation/Gait Ambulation/Gait Assistance: 4: Min guard Ambulation Distance (Feet): 150 Feet Assistive device: Rolling walker Gait Pattern: Within Functional Limits;Trunk flexed Gait velocity: WNL General Gait Details: On O2 with no dizziness reported Stairs: No Wheelchair Mobility Wheelchair Mobility: No    Exercises General Exercises - Lower Extremity Ankle Circles/Pumps: AROM;Both;10 reps Heel Slides: AROM;Both;10 reps;Supine   PT Diagnosis:    PT Problem List:   PT Treatment Interventions:     PT Goals Acute Rehab PT Goals Pt will go Supine/Side to Sit: with modified independence PT Goal: Supine/Side to Sit -  Progress: Updated due to goal met Pt will go Sit to Stand: with modified independence PT Goal: Sit to Stand - Progress: Updated due to goal met Pt will go Stand to Sit: with modified independence PT Goal: Stand to Sit - Progress: Updated due to goals met Pt will Ambulate: >150 feet;with rolling walker;with supervision PT Goal: Ambulate - Progress: Updated due to goal met  Visit Information  Last PT Received On: 04/29/12    Subjective Data  Subjective: I'm sleepy   Cognition       Balance  Balance Balance Assessed: No (no instability with gait)  End of Session PT - End of Session Equipment Utilized During Treatment: Gait belt Activity Tolerance: Patient tolerated treatment well Patient left: in chair;with call bell/phone within reach;with chair alarm set   GP     Konrad Penta 04/29/2012, 12:31 PM

## 2012-04-30 ENCOUNTER — Encounter (HOSPITAL_COMMUNITY): Payer: Self-pay | Admitting: Internal Medicine

## 2012-04-30 DIAGNOSIS — J9610 Chronic respiratory failure, unspecified whether with hypoxia or hypercapnia: Secondary | ICD-10-CM | POA: Diagnosis present

## 2012-04-30 DIAGNOSIS — F17200 Nicotine dependence, unspecified, uncomplicated: Secondary | ICD-10-CM

## 2012-04-30 HISTORY — DX: Chronic respiratory failure, unspecified whether with hypoxia or hypercapnia: J96.10

## 2012-04-30 LAB — COMPREHENSIVE METABOLIC PANEL
ALT: 11 U/L (ref 0–35)
AST: 16 U/L (ref 0–37)
Albumin: 2.6 g/dL — ABNORMAL LOW (ref 3.5–5.2)
Alkaline Phosphatase: 75 U/L (ref 39–117)
Potassium: 3.9 mEq/L (ref 3.5–5.1)
Sodium: 138 mEq/L (ref 135–145)
Total Protein: 5.4 g/dL — ABNORMAL LOW (ref 6.0–8.3)

## 2012-04-30 LAB — CBC
MCHC: 32.8 g/dL (ref 30.0–36.0)
Platelets: 150 10*3/uL (ref 150–400)
RDW: 14.1 % (ref 11.5–15.5)

## 2012-04-30 LAB — PROTIME-INR: Prothrombin Time: 13.7 seconds (ref 11.6–15.2)

## 2012-04-30 MED ORDER — HYDROCODONE-ACETAMINOPHEN 5-325 MG PO TABS: 0.5000 | ORAL_TABLET | Freq: Four times a day (QID) | ORAL | Status: DC | PRN

## 2012-04-30 MED ORDER — ACETAMINOPHEN 325 MG PO TABS: 650.0000 mg | ORAL_TABLET | Freq: Four times a day (QID) | ORAL | Status: DC | PRN

## 2012-04-30 MED ORDER — DIAZEPAM 5 MG PO TABS: 2.5000 mg | ORAL_TABLET | Freq: Every evening | ORAL | Status: DC | PRN

## 2012-04-30 NOTE — Progress Notes (Signed)
Occupational Therapy Treatment Patient Details Name: [name] MRN: [id number] DOB: Oct 14, 1926 Today's Date: 04/30/2012 Time: 4098-1191 OT Time Calculation (min): 23 min ADL 23'  OT Assessment / Plan / Recommendation Comments on Treatment Session A:  Pt. ampulated to bathroom with SBA.  Pt. very dizzy upon standing even with oxygen.  Pt. requires increase time for tasks and will also require more education on use of a walker and walker safety.    Follow Up Recommendations       Barriers to Discharge       Equipment Recommendations       Recommendations for Other Services    Frequency     Plan Discharge plan remains appropriate    Precautions / Restrictions Precautions Precautions: Fall Precaution Comments: Needs oxygen when ambulating Restrictions Weight Bearing Restrictions: No   Pertinent Vitals/Pain     ADL  Grooming: Performed;Wash/dry hands;Brushing hair;Supervision/safety Where Assessed - Grooming: Supported Copywriter, advertising: Performed;Modified independent Toilet Transfer Method: Other (comment) (ambulate with rolling walker) Toilet Transfer Equipment: Comfort height toilet;Grab bars Toileting - Clothing Manipulation and Hygiene: Performed;Supervision/safety Where Assessed - Glass blower/designer Manipulation and Hygiene: Standing Equipment Used: Back brace;Gait belt;Rolling walker    OT Diagnosis:    OT Problem List:   OT Treatment Interventions:     OT Goals Acute Rehab OT Goals OT Goal Formulation: With patient Time For Goal Achievement: 05/12/12 Potential to Achieve Goals: Good ADL Goals Pt Will Perform Grooming: with modified independence;Sitting, edge of bed ADL Goal: Grooming - Progress: Met Pt Will Perform Upper Body Bathing: with set-up;Sitting, edge of bed ADL Goal: Upper Body Bathing - Progress: Progressing toward goals Pt Will Perform Lower Body Bathing: with min assist;Sitting, edge of bed ADL Goal: Lower Body Bathing - Progress:  Progressing toward goals Pt Will Perform Upper Body Dressing: with set-up;Sitting, bed ADL Goal: Upper Body Dressing - Progress: Progressing toward goals Pt Will Perform Lower Body Dressing: with min assist;Sit to stand from bed Pt Will Transfer to Toilet: with supervision;with DME;Regular height toilet ADL Goal: Toilet Transfer - Progress: Met Pt Will Perform Toileting - Clothing Manipulation: with supervision;Sitting on 3-in-1 or toilet ADL Goal: Toileting - Clothing Manipulation - Progress: Met Pt Will Perform Toileting - Hygiene: with supervision;Leaning right and/or left on 3-in-1/toilet;Sit to stand from 3-in-1/toilet ADL Goal: Toileting - Hygiene - Progress: Met Arm Goals Pt Will Complete Theraband Exer: with supervision, verbal cues required/provided;to increase strength;Bilateral upper extremities;1 set;10 reps;Level 1 Theraband Miscellaneous OT Goals Miscellaneous OT Goal #1: Patient will donn/doff Cash brace with Min Assist.  Visit Information  Last OT Received On: 04/30/12    Subjective Data  Subjective: S:  I get to go home today   Prior Functioning       Cognition  Overall Cognitive Status: Appears within functional limits for tasks assessed/performed Arousal/Alertness: Awake/alert Orientation Level: Appears intact for tasks assessed Behavior During Session: [facility] for tasks performed    Mobility  Shoulder Instructions         Exercises      Balance     End of Session OT - End of Session Equipment Utilized During Treatment: Gait belt;Back brace Activity Tolerance: Patient limited by fatigue Patient left: in chair;with chair alarm set;with family/visitor present;with call bell/phone within reach  GO     Montserrat Shek L 04/30/2012, 1:44 PM

## 2012-04-30 NOTE — Progress Notes (Signed)
Discharge instructions given to pt. And pt's daughter with teach back given to RN. Pt. Taken to car via W/C.

## 2012-04-30 NOTE — Progress Notes (Signed)
Clarified again with daughter and pt., pt. Still does not want surgery, reg. Diet ordered for pt., [facility] cancelled and  [facility] radiology notified of pt. Not having surgery.

## 2012-04-30 NOTE — Progress Notes (Addendum)
Pt. Sitting without oxygen, reading 91-92 % on RA, pt. Up walking without oxygen, reading 82-83%, oxygen reapplied at 2liters via n/c, reading 90-91% while pt. walking, dr. Lendell Caprice aware.

## 2012-04-30 NOTE — Discharge Summary (Signed)
Physician Discharge Summary  Patient ID: [name] MRN: [id number] DOB/AGE: 15-Nov-1926 78 y.o.  Admit date: 04/27/2012 Discharge date: 04/30/2012  Discharge Diagnoses:  Principal Problem:  *Vertebral compression fracture, T12 Active Problems:  Difficulty walking  Fall  Benign hypertension  Tobacco use Chronic respiratory failure    Medication List     As of 04/30/2012 10:55 AM    TAKE these medications         acetaminophen 325 MG tablet   Commonly known as: TYLENOL   Take 2 tablets (650 mg total) by mouth every 6 (six) hours as needed for pain.      cyanocobalamin 1000 MCG/ML injection   Commonly known as: (VITAMIN B-12)   Inject 1,000 mcg into the muscle every 30 (thirty) days.      diazepam 5 MG tablet   Commonly known as: VALIUM   Take 0.5 tablets (2.5 mg total) by mouth at bedtime as needed for sleep. Sleep      donepezil 5 MG tablet   Commonly known as: ARICEPT   Take 5 mg by mouth at bedtime.      escitalopram 5 MG tablet   Commonly known as: LEXAPRO   Take 5 mg by mouth daily.      ferrous sulfate 325 (65 FE) MG tablet   Take 325 mg by mouth daily with breakfast.      folic acid 1 MG tablet   Commonly known as: FOLVITE   Take 1 mg by mouth daily.      HYDROcodone-acetaminophen 5-325 MG per tablet   Commonly known as: NORCO/VICODIN   Take 0.5-1 tablets by mouth every 6 (six) hours as needed for pain.      lisinopril 5 MG tablet   Commonly known as: PRINIVIL,ZESTRIL   Take 5 mg by mouth daily.      multivitamin with minerals tablet   Take 1 tablet by mouth daily.      polymixin-bacitracin 500-10000 UNIT/GM Oint ointment   Apply 1 application topically daily.      Vitamin D (Ergocalciferol) 50000 UNITS Caps   Commonly known as: DRISDOL   Take 50,000 Units by mouth every 7 (seven) days. Takes on Mondays      2 liters oxygen by nasal canula   Disposition: 01-Home or Self Care with home PT, OT, RN, aide  Discharged Condition:  stable  Consults:  PT, OT  Labs:   Results for orders placed during the hospital encounter of 04/27/12 (from the past 48 hour(s))  CBC     Status: Abnormal   Collection Time   04/30/12  5:11 AM      Component Value Range Comment   WBC 7.2  4.0 - 10.5 K/uL    RBC 3.84 (*) 3.87 - 5.11 MIL/uL    Hemoglobin 11.9 (*) 12.0 - 15.0 g/dL    HCT 78.2  95.6 - 21.3 %    MCV 94.5  78.0 - 100.0 fL    MCH 31.0  26.0 - 34.0 pg    MCHC 32.8  30.0 - 36.0 g/dL    RDW 08.6  57.8 - 46.9 %    Platelets 150  150 - 400 K/uL   COMPREHENSIVE METABOLIC PANEL     Status: Abnormal   Collection Time   04/30/12  5:11 AM      Component Value Range Comment   Sodium 138  135 - 145 mEq/L    Potassium 3.9  3.5 - 5.1 mEq/L    Chloride 102  96 - 112 mEq/L    CO2 31  19 - 32 mEq/L    Glucose, Bld 77  70 - 99 mg/dL    BUN 11  6 - 23 mg/dL    Creatinine, Ser 1.61  0.50 - 1.10 mg/dL    Calcium 8.9  8.4 - 09.6 mg/dL    Total Protein 5.4 (*) 6.0 - 8.3 g/dL    Albumin 2.6 (*) 3.5 - 5.2 g/dL    AST 16  0 - 37 U/L    ALT 11  0 - 35 U/L    Alkaline Phosphatase 75  39 - 117 U/L    Total Bilirubin 0.6  0.3 - 1.2 mg/dL    GFR calc non Af Amer 63 (*) >90 mL/min    GFR calc Af Amer 72 (*) >90 mL/min   PROTIME-INR     Status: Normal   Collection Time   04/30/12  5:11 AM      Component Value Range Comment   Prothrombin Time 13.7  11.6 - 15.2 seconds    INR 1.06  0.00 - 1.49   APTT     Status: Normal   Collection Time   04/30/12  5:11 AM      Component Value Range Comment   aPTT 33  24 - 37 seconds     Diagnostics:  Dg Thoracic Spine 2 View  04/27/2012  *RADIOLOGY REPORT*  Clinical Data: Fall, back pain  THORACIC SPINE - 2 VIEW  Comparison: Chest radiographs dated 06/06/2011  Findings: Normal thoracic kyphosis.  Mild superior endplate compression deformity at T12, age indeterminate but new from 2012.  Vertebral body heights are otherwise maintained.  Mild multilevel degenerative changes.  Visualized lungs are  essentially clear.  Moderate hiatal hernia.  IMPRESSION: Mild superior endplate compression deformity at T12, age indeterminate but new from 2012.  Correlate with the site of the patient's pain.   Original Report Authenticated By: Charline Bills, M.D.    Dg Lumbar Spine Complete  04/27/2012  *RADIOLOGY REPORT*  Clinical Data: Fall, back pain  LUMBAR SPINE - COMPLETE 4+ VIEW  Comparison: 10/09/2007  Findings: Five lumbar-type vertebral bodies.  Severe compression deformity at L1, unchanged.  Severe compression deformity at L2, progressed from 2009.  Visualized bony pelvis appears intact.  Vascular calcifications.  Moderate hiatal hernia.  IMPRESSION: Severe compression deformity at L2, progressed from 2009, but favored to be chronic.  Correlate with the site of the patient's pain.  Severe compression deformity at L1, chronic.   Original Report Authenticated By: Charline Bills, M.D.    Mr Laqueta Jean Wo Contrast  04/28/2012  *RADIOLOGY REPORT*  Clinical Data: Altered mental status.  Confusion, falls  MRI HEAD WITHOUT AND WITH CONTRAST  Technique:  Multiplanar, multiecho pulse sequences of the brain and surrounding structures were obtained according to standard protocol without and with intravenous contrast  Contrast: 12mL MULTIHANCE GADOBENATE DIMEGLUMINE 529 MG/ML IV SOLN  Comparison: CT head 09/12/2007  Findings: Image quality degraded by motion.  Age appropriate atrophy.  Mild chronic ischemic changes in the white matter.  This has progressed since the MRI of 08/07/2006.  No acute infarct.  Negative for hemorrhage or mass.  No fluid collection.  Paranasal sinuses are clear.  IMPRESSION: Atrophy and minimal chronic microvascular ischemic change in the white matter.  No acute abnormality.   Original Report Authenticated By: Janeece Riggers, M.D.    Mr Lumbar Spine Wo Contrast  04/29/2012  *RADIOLOGY REPORT*  Clinical Data: 78 year old female low back  pain, recent fall. low back  pain, recent fall. History of compression fractures.  MRI  LUMBAR SPINE WITHOUT CONTRAST  Technique:  Multiplanar and multiecho pulse sequences of the lumbar spine were obtained without intravenous contrast.  Comparison: Lumbar radiographs 04/27/2012.  Lumbar MRI 01/06/2007.  Findings: Evidence of bilateral pleural effusions at the costophrenic angles, apparently greater on the left.  Moderate to severe T12 compression fracture with patchy marrow edema throughout the vertebral body.  Trace marrow edema in both pedicles.  Mild retropulsion of the posterior-superior endplate narrowing the ventral CSF space, but without significant spinal stenosis (residual AP thecal sac 9 mm.).  Additionally there is marrow edema associated with the right T11 posterior rib and costovertebral junction.  Other visualized lower thoracic levels are intact.  Chronic severe L1 and L2 compression fractures.  Mild associated retropulsion of bone with no significant upper lumbar spinal stenosis.  L3, L4, and L5 are stable and intact.  The degenerative findings at the disc spaces at each of these levels have not significantly changed.  The visualized sacrum is intact.  No paraspinal hematoma identified.  The infrarenal abdominal aortic aneurysm has progressed, now measuring 47-50 mm maximum diameter at the L3-L4 level (previously 38-33 mm).  IMPRESSION: 1.  Acute or subacute T12 compression fracture. Mild retropulsion of bone with no significant associated spinal stenosis.  Trace marrow edema in both pedicles. 2.  Right T11 posterior rib fracture. 3.  Infrarenal abdominal aortic aneurysm with significant progression since 2008, maximum diameter now 50 mm (previously 33 mm). 4.  Bilateral pleural effusions partially visualized. 5.  Severe chronic L1 and L2 compression fractures.   Original Report Authenticated By: Erskine Speed, M.D.     Procedures: none  Hospital Course: See H&P for complete admission details.Is an 78 year old white female who presented after a fall. She injuredher back and was  unable to get up.  EMS brought her to the emergency room. Imaging studies showed multiple compression fractures, most of them old. However, MRI confirmed acute compression fracture at T12. She was unable to ambulate in the emergency room and was therefore admitted to the hospital. She worked with physical therapy, occupational therapy. A cash brace has been recommended fitted. She refused vertebroplasty. Initially it was thought she might require skilled nursing facility, but her ambulation has improved with oxygen, and she will benefit from home with physical therapy and other services. With oxygen, she has been able to ambulate 150 feet with a walker. It was noted that her oxygen saturation on room air dropped into the 80s while ambulating. She does not want placement. Family members are available to assist her at home. She continues to smoke and was encouraged against. Home oxygen, rolling walker with a seat, physical therapy, occupational therapy, RN and aide will be arranged.  Discharge Exam:  Blood pressure 108/65, pulse 63, temperature 98.3 F (36.8 C), temperature source Oral, resp. rate 18, height 5\' 4"  (1.626 m), weight 61.43 kg (135 lb 6.9 oz), SpO2 96.00%.  General: Drowsy. Does not remember me. Follows commands and answers questions. Lungs clear to auscultation bilaterally without wheeze rhonchi or rales Cardiovascular regular rate rhythm without murmurs gallops rubs Abdomen soft nontender nondistended Extremities no clubbing cyanosis or edema  Signed: Bladimir Auman L 04/30/2012, 10:55 AM

## 2012-05-12 ENCOUNTER — Other Ambulatory Visit: Payer: Self-pay | Admitting: Family Medicine

## 2012-05-12 ENCOUNTER — Ambulatory Visit
Admission: RE | Admit: 2012-05-12 | Discharge: 2012-05-12 | Disposition: A | Discharge status: Home / Self Care | Payer: Medicare Other | Source: Ambulatory Visit | Attending: Family Medicine | Admitting: Family Medicine

## 2012-05-12 DIAGNOSIS — J90 Pleural effusion, not elsewhere classified: Secondary | ICD-10-CM

## 2012-05-15 ENCOUNTER — Other Ambulatory Visit (HOSPITAL_COMMUNITY): Payer: Self-pay | Admitting: Family Medicine

## 2012-05-15 DIAGNOSIS — IMO0002 Reserved for concepts with insufficient information to code with codable children: Secondary | ICD-10-CM

## 2012-05-19 ENCOUNTER — Ambulatory Visit (HOSPITAL_COMMUNITY): Admission: RE | Admit: 2012-05-19 | Payer: Medicare Other | Source: Ambulatory Visit

## 2012-05-26 ENCOUNTER — Ambulatory Visit (HOSPITAL_COMMUNITY)
Admission: RE | Admit: 2012-05-26 | Discharge: 2012-05-26 | Disposition: A | Discharge status: Home / Self Care | Payer: Medicare Other | Source: Ambulatory Visit | Attending: Family Medicine | Admitting: Family Medicine

## 2012-05-26 ENCOUNTER — Other Ambulatory Visit (HOSPITAL_COMMUNITY): Payer: Self-pay | Admitting: Interventional Radiology

## 2012-05-26 DIAGNOSIS — IMO0002 Reserved for concepts with insufficient information to code with codable children: Secondary | ICD-10-CM

## 2012-05-27 ENCOUNTER — Encounter: Payer: Self-pay | Admitting: Vascular Surgery

## 2012-05-28 ENCOUNTER — Other Ambulatory Visit: Payer: Self-pay | Admitting: Radiology

## 2012-05-28 ENCOUNTER — Encounter: Payer: Self-pay | Admitting: Vascular Surgery

## 2012-05-28 ENCOUNTER — Other Ambulatory Visit: Payer: Self-pay | Admitting: Physician Assistant

## 2012-05-28 ENCOUNTER — Ambulatory Visit (INDEPENDENT_AMBULATORY_CARE_PROVIDER_SITE_OTHER): Payer: Medicare Other | Admitting: Vascular Surgery

## 2012-05-28 VITALS — BP 154/58 | HR 56 | Ht 64.0 in | Wt 131.5 lb

## 2012-05-28 DIAGNOSIS — I714 Abdominal aortic aneurysm, without rupture: Secondary | ICD-10-CM | POA: Insufficient documentation

## 2012-05-28 NOTE — Progress Notes (Signed)
[facility] [place]  Patient name: [name] MRN: [id number] DOB: Dec 26, 1926 Sex: female  REASON FOR CONSULT: 5.0 cm infrarenal abdominal aortic aneurysm. He for by Dr. Shaune Pollack  HPI: [name] is a 78 y.o. female who was hospitalized from 04/27/2012 to 04/30/2012 after a fall. She was found to have a compression fracture of T12 and a rib fracture on the right. During this admission she had an MRI and it was noted that her her abdominal aortic aneurysm had enlarged to 5.0 cm compared to her previous study when it was 3.8 cm. She was sent for vascular consultation. Apparently I have seen her in the remote past with an aneurysm but I currently do not have these records. Does continue to have some low back pain and vague right-sided abdominal pain related to her fall. She denies the acute onset of any new abdominal pain or back pain. She is unaware of any family history of aneurysmal disease.  She quit smoking approximately 5 weeks ago when it was noted that she had low oxygen and has been on some oxygen at home now.  Past Medical History  Diagnosis Date  . Hypertension   . Memory loss   . Anemia   . Chronic back pain   . Chronic kidney disease   . Anxiety   . Panic attack   . Chronic respiratory failure 04/30/2012  . Cancer     skin  . COPD (chronic obstructive pulmonary disease)     Family History  Problem Relation Age of Onset  . Other Mother   . AAA (abdominal aortic aneurysm) Mother   . Heart attack Mother   . Cancer Sister   . Cancer Brother     SOCIAL HISTORY: History  Substance Use Topics  . Smoking status: Former Smoker -- 1.0 packs/day for 25 years    Types: Cigarettes  . Smokeless tobacco: Never Used  . Alcohol Use: No    Allergies  Allergen Reactions  . Biaxin (Clarithromycin)   . Erythromycin   . Keflex (Cephalexin)   . Macrodantin (Nitrofurantoin Macrocrystal)   . Penicillins   . Sulfa Antibiotics     Current Outpatient  Prescriptions  Medication Sig Dispense Refill  . cyanocobalamin (,VITAMIN B-12,) 1000 MCG/ML injection Inject 1,000 mcg into the muscle every 30 (thirty) days.      . diazepam (VALIUM) 5 MG tablet Take 0.5 tablets (2.5 mg total) by mouth at bedtime as needed for sleep. Sleep  30 tablet    . donepezil (ARICEPT) 5 MG tablet Take 5 mg by mouth at bedtime.      Marland Kitchen escitalopram (LEXAPRO) 5 MG tablet Take 5 mg by mouth daily.      . ferrous sulfate 325 (65 FE) MG tablet Take 325 mg by mouth daily with breakfast.      . folic acid (FOLVITE) 1 MG tablet Take 1 mg by mouth daily.      Marland Kitchen lisinopril (PRINIVIL,ZESTRIL) 5 MG tablet Take 5 mg by mouth daily.      . Multiple Vitamin (MULTIVITAMIN) tablet Take 1 tablet by mouth daily.      . Multiple Vitamins-Minerals (MULTIVITAMIN WITH MINERALS) tablet Take 1 tablet by mouth daily.      . Vitamin D, Ergocalciferol, (DRISDOL) 50000 UNITS CAPS Take 50,000 Units by mouth every 7 (seven) days. Takes on Mondays      . acetaminophen (TYLENOL) 325 MG tablet Take 2 tablets (650 mg total) by mouth every 6 (  six) hours as needed for pain.      Marland Kitchen HYDROcodone-acetaminophen (NORCO/VICODIN) 5-325 MG per tablet Take 0.5-1 tablets by mouth every 6 (six) hours as needed for pain.  20 tablet  0  . polymixin-bacitracin (POLYSPORIN) 500-10000 UNIT/GM OINT ointment Apply 1 application topically daily.        REVIEW OF SYSTEMS: Arly.Keller ] denotes positive finding; [  ] denotes negative finding  CARDIOVASCULAR:  [ ]  chest pain   [ ]  chest pressure   [ ]  palpitations   [ ]  orthopnea   Arly.Keller ] dyspnea on exertion   [ ]  claudication   [ ]  rest pain   [ ]  DVT   [ ]  phlebitis PULMONARY:   [ ]  productive cough   [ ]  asthma   [ ]  wheezing NEUROLOGIC:   [ ]  weakness  [ ]  paresthesias  [ ]  aphasia  [ ]  amaurosis  [ ]  dizziness HEMATOLOGIC:   [ ]  bleeding problems   [ ]  clotting disorders MUSCULOSKELETAL:  [ ]  joint pain   [ ]  joint swelling [ ]  leg swelling GASTROINTESTINAL: [ ]   blood in stool  [ ]    hematemesis GENITOURINARY:  [ ]   dysuria  [ ]   hematuria PSYCHIATRIC:  [ ]  history of major depression INTEGUMENTARY:  [ ]  rashes  [ ]  ulcers CONSTITUTIONAL:  [ ]  fever   [ ]  chills  PHYSICAL EXAM: Filed Vitals:   05/28/12 1152  BP: 154/58  Pulse: 56  Height: 5\' 4"  (1.626 m)  Weight: 131 lb 8 oz (59.648 kg)  SpO2: 100%   Body mass index is 22.57 kg/(m^2). GENERAL: The patient is a well-nourished female, in no acute distress. The vital signs are documented above. CARDIOVASCULAR: There is a regular rate and rhythm. I do not detect carotid bruits. She has palpable femoral pulses. Both feet are warm and well-perfused. She has no significant lower extremity swelling. PULMONARY: There is good air exchange bilaterally without wheezing or rales. ABDOMEN: Soft and non-tender with normal pitched bowel sounds. Her aneurysm is palpable and nontender MUSCULOSKELETAL: There are no major deformities or cyanosis. NEUROLOGIC: No focal weakness or paresthesias are detected. SKIN: There are no ulcers or rashes noted. PSYCHIATRIC: The patient has a normal affect.  DATA:  I have reviewed her MRI which shows that the maximum diameter of her distal aorta is 5 cm.  I have reviewed her records from Dr. Shaune Pollack office. He does have a history of B12 deficiency and some unintentional weight loss.  MEDICAL ISSUES:  Abdominal aneurysm without mention of rupture By MRI this patient has a 5.0 cm infrarenal abdominal aortic aneurysm. I have explained that in a normal risk patient we would consider elective repair at 5.5 cm. I have recommended a follow up CT scan in 6 months which I have ordered. This will help determine if she would be a candidate for an endovascular aneurysm repair if the aneurysm ever enlarged enough to consider elective repair. I explained I do not think her abdominal and back pain were related to her aneurysm. This is most likely related to her fall. She has recently quit smoking I have  explained that tobacco does increase the risk of aneurysm expansion and rupture. She feels that she will not be smoking anymore. I'll see her back in 6 months. She knows to call sooner if she has problems.   Jarris Kortz S [facility] [place] Beeper: [phone]

## 2012-05-28 NOTE — Assessment & Plan Note (Signed)
By MRI this patient has a 5.0 cm infrarenal abdominal aortic aneurysm. I have explained that in a normal risk patient we would consider elective repair at 5.5 cm. I have recommended a follow up CT scan in 6 months which I have ordered. This will help determine if she would be a candidate for an endovascular aneurysm repair if the aneurysm ever enlarged enough to consider elective repair. I explained I do not think her abdominal and back pain were related to her aneurysm. This is most likely related to her fall. She has recently quit smoking I have explained that tobacco does increase the risk of aneurysm expansion and rupture. She feels that she will not be smoking anymore. I'll see her back in 6 months. She knows to call sooner if she has problems.

## 2012-05-29 NOTE — Addendum Note (Signed)
Addended by: Sharee Pimple on: 05/29/2012 09:20 AM   Modules accepted: Orders

## 2012-05-30 ENCOUNTER — Ambulatory Visit (HOSPITAL_COMMUNITY): Admission: RE | Admit: 2012-05-30 | Payer: Medicare Other | Source: Ambulatory Visit

## 2012-06-27 ENCOUNTER — Encounter: Payer: Self-pay | Admitting: Pulmonary Disease

## 2012-06-27 ENCOUNTER — Ambulatory Visit (INDEPENDENT_AMBULATORY_CARE_PROVIDER_SITE_OTHER): Payer: Medicare Other | Admitting: Pulmonary Disease

## 2012-06-27 VITALS — BP 108/62 | HR 58 | Temp 97.0°F | Ht 62.0 in | Wt 134.0 lb

## 2012-06-27 DIAGNOSIS — R0902 Hypoxemia: Secondary | ICD-10-CM

## 2012-06-27 NOTE — Assessment & Plan Note (Signed)
The patient was found to have hypoxemia and started on oxygen after a hospitalization in November of last year.  She has a long history of smoking, and may have underlying COPD.  She also has very significant kyphosis, as well as a large hiatal hernia, both of which can cause restrictive lung disease.  Her saturations today are excellent, and it may be that she is breathing better since she has quit smoking.  I would like to schedule her for full pulmonary function studies for evaluation, and then look again at her oxygen needs.

## 2012-06-27 NOTE — Progress Notes (Signed)
  Subjective:    Patient ID: [name], female    DOB: 03/09/1927, 79 y.o.   MRN: [id number]  HPI The patient is a very pleasant 79 year old female who I've been asked to see for hypoxemia.  The patient has a long history of smoking, but has never had pulmonary function studies.  She was in the hospital in November of last year after a fall which resulted in vertebral body fractures.  During that admission she was found to have hypoxemia and discharged home on supplemental oxygen.  The patient states that she has not smoked since that hospitalization, and has seen significant improvement in cough and mucus.  The family states that she has had shortness of breath issues for several years, and they feel it has gotten worse.  She does not think she gets winded just walking through her house, but the family has noticed shortness of breath with activities of daily living such as getting dressed.  They state that she is very weak and debilitated.  The family states that her breathing is much improved since she has stopped smoking and is wearing oxygen.  The patient has not had any significant lower extremity edema, and is unsure if she has any kind of heart disease.  She had a chest x-ray last in December of last year which showed clear lung fields, but did show significant kyphosis and a large hiatal hernia.   Review of Systems  Constitutional: Positive for unexpected weight change ( varies). Negative for fever.  HENT: Positive for rhinorrhea ( since O2) and trouble swallowing ( burping///causing her to spit her food back up so she may try and re-swallow). Negative for ear pain, nosebleeds, congestion, sore throat, sneezing, dental problem, postnasal drip and sinus pressure.   Eyes: Negative for redness and itching.  Respiratory: Positive for cough ( ). Negative for chest tightness, shortness of breath and wheezing.        Patient quit smokng x 2 months ago and used to c/o sob, wheezing and prod cough    Cardiovascular: Negative for palpitations and leg swelling.  Gastrointestinal: Negative for nausea and vomiting.       Indigestion  Genitourinary: Negative for dysuria.  Musculoskeletal: Positive for joint swelling and arthralgias.  Skin: Negative for rash.  Neurological: Negative for headaches.  Hematological: Does not bruise/bleed easily.  Psychiatric/Behavioral: Positive for dysphoric mood. The patient is nervous/anxious.        Objective:   Physical Exam Constitutional:  Frail appearing female, no acute distress  HENT:  Nares patent without discharge  Oropharynx without exudate, palate and uvula are normal  Eyes:  Perrla, eomi, no scleral icterus  Neck:  No JVD, no TMG  Cardiovascular:  Normal rate, regular rhythm, no rubs or gallops.  2/6 sem        Intact distal pulses but very diminished.  Pulmonary : decreased breath sounds, no stridor or respiratory distress   No rales, rhonchi, or wheezing  Abdominal:  Soft, nondistended, bowel sounds present.  No tenderness noted.   Musculoskeletal:  mild lower extremity edema noted.  Lymph Nodes:  No cervical lymphadenopathy noted  Skin:  No cyanosis noted  Neurologic:  Alert, appropriate, moves all 4 extremities without obvious deficit.         Assessment & Plan:

## 2012-06-27 NOTE — Patient Instructions (Addendum)
Stay on oxygen for now.  We will determine your needs after next visit. Will schedule for breathing studies, and see you back the same day to review.

## 2012-07-18 ENCOUNTER — Other Ambulatory Visit: Payer: Self-pay | Admitting: Family Medicine

## 2012-07-18 DIAGNOSIS — L819 Disorder of pigmentation, unspecified: Secondary | ICD-10-CM

## 2012-07-29 ENCOUNTER — Ambulatory Visit (INDEPENDENT_AMBULATORY_CARE_PROVIDER_SITE_OTHER): Payer: Medicare Other | Admitting: Pulmonary Disease

## 2012-07-29 ENCOUNTER — Encounter: Payer: Self-pay | Admitting: Pulmonary Disease

## 2012-07-29 VITALS — BP 98/62 | HR 64 | Temp 97.3°F | Ht 62.0 in | Wt 134.0 lb

## 2012-07-29 DIAGNOSIS — J439 Emphysema, unspecified: Secondary | ICD-10-CM | POA: Insufficient documentation

## 2012-07-29 DIAGNOSIS — J9610 Chronic respiratory failure, unspecified whether with hypoxia or hypercapnia: Secondary | ICD-10-CM

## 2012-07-29 DIAGNOSIS — J438 Other emphysema: Secondary | ICD-10-CM

## 2012-07-29 LAB — PULMONARY FUNCTION TEST

## 2012-07-29 NOTE — Progress Notes (Signed)
  Subjective:    Patient ID: [name], female    DOB: [birth year], 79 y.o.   MRN: [id number]  HPI The patient comes in today for followup after her pulmonary function studies.  She was found to have moderate airflow obstruction, but a good response to bronchodilators.  She had no restriction, and only a mild reduction in her diffusion capacity.  I have reviewed the studies with her in detail, and answered all of her questions.   Review of Systems  Constitutional: Negative for fever and unexpected weight change.  HENT: Positive for rhinorrhea. Negative for ear pain, nosebleeds, congestion, sore throat, sneezing, trouble swallowing, dental problem, postnasal drip and sinus pressure.   Eyes: Negative for redness and itching.  Respiratory: Negative for cough, chest tightness, shortness of breath and wheezing.   Cardiovascular: Negative for palpitations and leg swelling.  Gastrointestinal: Negative for nausea and vomiting.  Genitourinary: Negative for dysuria.  Musculoskeletal: Negative for joint swelling.  Skin: Negative for rash.  Neurological: Negative for headaches.  Hematological: Does not bruise/bleed easily.  Psychiatric/Behavioral: Negative for dysphoric mood. The patient is not nervous/anxious.        Objective:   Physical Exam Thin female in no acute distress Nose without purulence or discharge noted Neck without lymphadenopathy or thyromegaly Chest with decreased breath sounds but no wheezing Cardiac exam regular rate and rhythm Lower extremities with minimal edema, no cyanosis Alert and oriented, moves all 4 extremities.       Assessment & Plan:

## 2012-07-29 NOTE — Patient Instructions (Addendum)
Will start you on symbicort 160/4.5, 2 sprays am and pm each day everyday with a spacer.  Rinse mouth well  Wear oxygen with activity around the house, any time your leave the house, and whenever sleeping.  Do not need during the day when sitting down relaxing.  followup with me in 3mos to see how things are going.

## 2012-07-29 NOTE — Progress Notes (Signed)
PFT done today. 

## 2012-07-29 NOTE — Assessment & Plan Note (Signed)
The patient has moderate airflow obstruction on her recent pulmonary function studies, and also has a period good response to bronchodilators.  I would like to start her on a LABA/ICS and see how she does.  Even though she did not have restriction on her PFTs, I suspect her kyphoscoliosis and hiatal hernia are contributing to her symptoms.  I would like to see how she does over the next few months, and would consider referring her to pulmonary rehabilitation at the next visit

## 2012-07-30 ENCOUNTER — Ambulatory Visit
Admission: RE | Admit: 2012-07-30 | Discharge: 2012-07-30 | Disposition: A | Discharge status: Home / Self Care | Payer: Medicare Other | Source: Ambulatory Visit | Attending: Family Medicine | Admitting: Family Medicine

## 2012-08-07 ENCOUNTER — Encounter: Payer: Self-pay | Admitting: Pulmonary Disease

## 2012-09-08 ENCOUNTER — Telehealth: Payer: Self-pay | Admitting: Pulmonary Disease

## 2012-09-08 MED ORDER — BUDESONIDE-FORMOTEROL FUMARATE 160-4.5 MCG/ACT IN AERO: 2.0000 | INHALATION_SPRAY | Freq: Two times a day (BID) | RESPIRATORY_TRACT | Status: DC

## 2012-09-08 NOTE — Telephone Encounter (Signed)
Spoke with pts daughter and symbicort sample was given at last OV . And seems to help. Requesting rx for symbicort rx sent . Nothing further  Needed.

## 2012-10-15 ENCOUNTER — Ambulatory Visit
Admission: RE | Admit: 2012-10-15 | Discharge: 2012-10-15 | Disposition: A | Discharge status: Home / Self Care | Payer: Medicare Other | Source: Ambulatory Visit | Attending: Family Medicine | Admitting: Family Medicine

## 2012-10-15 ENCOUNTER — Other Ambulatory Visit: Payer: Self-pay | Admitting: Family Medicine

## 2012-10-15 DIAGNOSIS — R404 Transient alteration of awareness: Secondary | ICD-10-CM

## 2012-10-28 ENCOUNTER — Ambulatory Visit (INDEPENDENT_AMBULATORY_CARE_PROVIDER_SITE_OTHER): Payer: Medicare Other | Admitting: Pulmonary Disease

## 2012-10-28 ENCOUNTER — Encounter: Payer: Self-pay | Admitting: Pulmonary Disease

## 2012-10-28 VITALS — BP 94/60 | HR 56 | Temp 97.3°F | Ht 62.0 in | Wt 137.6 lb

## 2012-10-28 DIAGNOSIS — J439 Emphysema, unspecified: Secondary | ICD-10-CM

## 2012-10-28 DIAGNOSIS — J9610 Chronic respiratory failure, unspecified whether with hypoxia or hypercapnia: Secondary | ICD-10-CM

## 2012-10-28 DIAGNOSIS — J438 Other emphysema: Secondary | ICD-10-CM

## 2012-10-28 NOTE — Patient Instructions (Addendum)
Stay on symbicort and oxygen Will refer you to pulmonary rehab at [facility]. followup with me in 6mos.

## 2012-10-28 NOTE — Assessment & Plan Note (Signed)
The patient has moderate COPD by recent pulmonary function studies, and has been stable on symbicort and oxygen.  I do think her kyphosis and large hiatal hernia probably contributes to her dyspnea as well.  At this point, her strength and conditioning her very poor, and I have highly recommended referral to pulmonary rehabilitation.  The patient is agreeable to attend.

## 2012-10-28 NOTE — Progress Notes (Signed)
  Subjective:    Patient ID: [name], female    DOB: 01-11-1927, 79 y.o.   MRN: [id number]  HPI Patient comes in today for followup of her known COPD.  She was started on symbicort at the last visit, but is unsure if it is made a difference to her dyspnea on exertion.  She has also been wearing oxygen with sleep and heavier exertional activities.  She denies any significant cough or mucus production.  She does complain about decreased conditioning and strength.   Review of Systems  Constitutional: Negative for fever and unexpected weight change.  HENT: Positive for rhinorrhea and postnasal drip. Negative for ear pain, nosebleeds, congestion, sore throat, sneezing, trouble swallowing, dental problem and sinus pressure.   Eyes: Negative for redness and itching.  Respiratory: Positive for shortness of breath. Negative for cough, chest tightness and wheezing.   Cardiovascular: Negative for palpitations and leg swelling.  Gastrointestinal: Negative for nausea and vomiting.  Genitourinary: Negative for dysuria.  Musculoskeletal: Negative for joint swelling.  Skin: Negative for rash.  Neurological: Positive for headaches.  Hematological: Does not bruise/bleed easily.  Psychiatric/Behavioral: Positive for dysphoric mood. The patient is nervous/anxious.        Objective:   Physical Exam Frail-appearing female in no acute distress Nose without purulence or discharge noted Neck without lymphadenopathy or thyromegaly Chest with decreased breath sounds, no wheezes or rhonchi Cardiac exam with regular rate and rhythm Upper extremities without edema, no cyanosis Alert and oriented, moves all 4 extremities.       Assessment & Plan:

## 2012-11-25 ENCOUNTER — Encounter: Payer: Self-pay | Admitting: Vascular Surgery

## 2012-11-26 ENCOUNTER — Encounter: Payer: Self-pay | Admitting: Vascular Surgery

## 2012-11-26 ENCOUNTER — Ambulatory Visit
Admission: RE | Admit: 2012-11-26 | Discharge: 2012-11-26 | Disposition: A | Discharge status: Home / Self Care | Payer: Medicare Other | Source: Ambulatory Visit | Attending: Vascular Surgery | Admitting: Vascular Surgery

## 2012-11-26 ENCOUNTER — Other Ambulatory Visit: Payer: Self-pay | Admitting: *Deleted

## 2012-11-26 ENCOUNTER — Ambulatory Visit (INDEPENDENT_AMBULATORY_CARE_PROVIDER_SITE_OTHER): Payer: Medicare Other | Admitting: Vascular Surgery

## 2012-11-26 VITALS — BP 125/63 | HR 61 | Resp 16 | Ht 60.0 in | Wt 138.0 lb

## 2012-11-26 DIAGNOSIS — I714 Abdominal aortic aneurysm, without rupture: Secondary | ICD-10-CM

## 2012-11-26 DIAGNOSIS — M79609 Pain in unspecified limb: Secondary | ICD-10-CM

## 2012-11-26 MED ORDER — IOHEXOL 300 MG/ML  SOLN
100.0000 mL | Freq: Once | INTRAMUSCULAR | Status: AC | PRN
  Administered 2012-11-26: 100 mL via INTRAVENOUS

## 2012-11-26 NOTE — Assessment & Plan Note (Signed)
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 She does not appear to be a candidate for EVAR. I've explained that in a normal risk patient we would consider elective repair at 5.5 cm. However, given her age and markedly debilitated state with oxygen dependent COPD, she would be at very high risk for open surgery. I have ordered a follow up ultrasound in 6 months and I'll see her back at that time. We would only consider elective repair if the aneurysm enlarged significantly or became symptomatic. Again this would be associated with significant risk of perioperative mortality or morbidity.

## 2012-11-26 NOTE — Progress Notes (Signed)
[facility] [place]  Patient name: [name] MRN: [id number] DOB: Dec 15, 1926 Sex: female  REASON FOR VISIT: follow up of abdominal aortic aneurysm.  HPI: [name] is a 79 y.o. female who I saw in consultation on 05/28/2012 J5 0.0 cm abdominal aortic aneurysm. She she had an MRI after she had sustained a compression fracture of T12 and rib fracture. This showed that her aneurysm was 5.0 cm. Previous study in the past and showed it to be 3.8 cm. We've previously discussed considering elective repair and a normal risk patient the aneurysm enlarged to 5.5 cm in maximum diameter. She comes in for a 6 month follow up visit. She doescontinue to have some chronic back pain but no acute onset abdominal pain or back pain.   She has a history of COPD and his oxygen dependent.  Past Medical History  Diagnosis Date  . Hypertension   . Memory loss   . Anemia   . Chronic back pain   . Chronic kidney disease   . Anxiety   . Panic attack   . Chronic respiratory failure 04/30/2012  . COPD (chronic obstructive pulmonary disease)   . AAA (abdominal aortic aneurysm)   . Cancer     skin Left wrist BCC  . DVT (deep venous thrombosis)    Family History  Problem Relation Age of Onset  . Other Mother   . AAA (abdominal aortic aneurysm) Mother   . Heart attack Mother   . Bladder Cancer Sister   . Breast cancer Sister   . Lung cancer Sister   . Colon cancer Brother   . Colon cancer Brother   . Hyperlipidemia Father   . Hypertension Father   . Heart disease Father     Heart Disease before age 50   SOCIAL HISTORY: History  Substance Use Topics  . Smoking status: Former Smoker -- 1.00 packs/day for 25 years    Types: Cigarettes    Quit date: 04/25/2012  . Smokeless tobacco: Never Used     Comment: PATIENT QUIT AFTER FALL ON 04/2012  . Alcohol Use: No   Allergies  Allergen Reactions  . Biaxin (Clarithromycin)   . Erythromycin   . Keflex (Cephalexin)   . Macrodantin  (Nitrofurantoin Macrocrystal)   . Penicillins   . Sulfa Antibiotics    Current Outpatient Prescriptions  Medication Sig Dispense Refill  . acetaminophen (TYLENOL) 325 MG tablet Take 2 tablets (650 mg total) by mouth every 6 (six) hours as needed for pain.      . budesonide-formoterol (SYMBICORT) 160-4.5 MCG/ACT inhaler Inhale 2 puffs into the lungs 2 (two) times daily.  1 Inhaler  12  . cyanocobalamin (,VITAMIN B-12,) 1000 MCG/ML injection Inject 1,000 mcg into the muscle every 30 (thirty) days.      . diazepam (VALIUM) 5 MG tablet Take 0.5 tablets (2.5 mg total) by mouth at bedtime as needed for sleep. Sleep  30 tablet    . donepezil (ARICEPT) 5 MG tablet Take 5 mg by mouth daily.       Marland Kitchen escitalopram (LEXAPRO) 5 MG tablet Take 5 mg by mouth daily.      . ferrous sulfate 325 (65 FE) MG tablet Take 325 mg by mouth daily with breakfast.      . folic acid (FOLVITE) 1 MG tablet Take 1 mg by mouth daily.      Marland Kitchen lisinopril (PRINIVIL,ZESTRIL) 5 MG tablet Take 5 mg by mouth daily.      Marland Kitchen  Multiple Vitamin (MULTIVITAMIN) tablet Take 1 tablet by mouth daily.      . polymixin-bacitracin (POLYSPORIN) 500-10000 UNIT/GM OINT ointment Apply 1 application topically daily.      . Vitamin D, Ergocalciferol, (DRISDOL) 50000 UNITS CAPS Take 50,000 Units by mouth every 7 (seven) days. Takes on Mondays       No current facility-administered medications for this visit.   REVIEW OF SYSTEMS: Arly.Keller ] denotes positive finding; [  ] denotes negative finding  CARDIOVASCULAR:  [ ]  chest pain   [ ]  chest pressure   [ ]  palpitations   Arly.Keller ] orthopnea   Arly.Keller ] dyspnea on exertion   [ ]  claudication   [ ]  rest pain   [ ]  DVT   [ ]  phlebitis PULMONARY:   Arly.Keller ] productive cough   [ ]  asthma   [ ]  wheezing NEUROLOGIC:   [ ]  weakness  [ ]  paresthesias  [ ]  aphasia  [ ]  amaurosis  [ ]  dizziness HEMATOLOGIC:   [ ]  bleeding problems   [ ]  clotting disorders MUSCULOSKELETAL:  [ ]  joint pain   [ ]  joint swelling [ ]  leg  swelling GASTROINTESTINAL: [ ]   blood in stool  [ ]   hematemesis GENITOURINARY:  Arly.Keller ]  dysuria  [ ]   hematuria PSYCHIATRIC:  Arly.Keller ] history of major depression INTEGUMENTARY:  [ ]  rashes  [ ]  ulcers CONSTITUTIONAL:  [ ]  fever   [ ]  chills  PHYSICAL EXAM: Filed Vitals:   11/26/12 1226  BP: 125/63  Pulse: 61  Resp: 16  Height: 5' (1.524 m)  Weight: 138 lb (62.596 kg)  SpO2: 100%   Body mass index is 26.95 kg/(m^2). GENERAL: The patient is a well-nourished female, in no acute distress. The vital signs are documented above. CARDIOVASCULAR: There is a regular rate and rhythm. Do not detect carotid bruits. His palpable femoral pulses and warm well-perfused feet. She has no significant lower extremity swelling. PULMONARY: There is good air exchange bilaterally without wheezing or rales. ABDOMEN: Soft and non-tender with normal pitched bowel sounds.  MUSCULOSKELETAL: There are no major deformities or cyanosis. NEUROLOGIC: No focal weakness or paresthesias are detected. SKIN: There are no ulcers or rashes noted. PSYCHIATRIC: The patient has a normal affect.  DATA:  I have reviewed her CT scan today which shows that her aneurysm is juxtarenal with significant tortuosity at the level of the renal arteries. Based on her CT scan, she is not a candidate for EVAR. The aneurysm measured 5.1 cm in maximum diameter and has thus not enlarged significantly over the last 6 months.  MEDICAL ISSUES:   Abdominal aneurysm without mention of rupture This patient has a 5.1 cm juxtarenal abdominal aortic aneurysm. She does not appear to be a candidate for EVAR. I've explained that in a normal risk patient we would consider elective repair at 5.5 cm. However, given her age and markedly debilitated state with oxygen dependent COPD, she would be at very high risk for open surgery. I have ordered a follow up ultrasound in 6 months and I'll see her back at that time. We would only consider elective repair if the  aneurysm enlarged significantly or became symptomatic. Again this would be associated with significant risk of perioperative mortality or morbidity.   DICKSON,CHRISTOPHER S [facility] [place] Beeper: [phone]

## 2013-01-07 ENCOUNTER — Encounter (HOSPITAL_COMMUNITY): Payer: Self-pay | Admitting: Emergency Medicine

## 2013-01-07 ENCOUNTER — Emergency Department (HOSPITAL_COMMUNITY): Payer: Medicare Other

## 2013-01-07 ENCOUNTER — Inpatient Hospital Stay (HOSPITAL_COMMUNITY)
Admission: EM | Admit: 2013-01-07 | Discharge: 2013-01-12 | DRG: 470 | Disposition: A | Discharge status: Skilled Nursing Facility | Payer: Medicare Other | Attending: Internal Medicine | Admitting: Internal Medicine

## 2013-01-07 DIAGNOSIS — W19XXXA Unspecified fall, initial encounter: Secondary | ICD-10-CM

## 2013-01-07 DIAGNOSIS — S72009A Fracture of unspecified part of neck of unspecified femur, initial encounter for closed fracture: Secondary | ICD-10-CM

## 2013-01-07 DIAGNOSIS — Y92009 Unspecified place in unspecified non-institutional (private) residence as the place of occurrence of the external cause: Secondary | ICD-10-CM

## 2013-01-07 DIAGNOSIS — M4000 Postural kyphosis, site unspecified: Secondary | ICD-10-CM | POA: Diagnosis present

## 2013-01-07 DIAGNOSIS — Z87891 Personal history of nicotine dependence: Secondary | ICD-10-CM

## 2013-01-07 DIAGNOSIS — I129 Hypertensive chronic kidney disease with stage 1 through stage 4 chronic kidney disease, or unspecified chronic kidney disease: Secondary | ICD-10-CM | POA: Diagnosis present

## 2013-01-07 DIAGNOSIS — R7989 Other specified abnormal findings of blood chemistry: Secondary | ICD-10-CM | POA: Diagnosis present

## 2013-01-07 DIAGNOSIS — I959 Hypotension, unspecified: Secondary | ICD-10-CM | POA: Diagnosis present

## 2013-01-07 DIAGNOSIS — S72001A Fracture of unspecified part of neck of right femur, initial encounter for closed fracture: Secondary | ICD-10-CM

## 2013-01-07 DIAGNOSIS — D638 Anemia in other chronic diseases classified elsewhere: Secondary | ICD-10-CM | POA: Diagnosis present

## 2013-01-07 DIAGNOSIS — D62 Acute posthemorrhagic anemia: Secondary | ICD-10-CM | POA: Diagnosis not present

## 2013-01-07 DIAGNOSIS — J439 Emphysema, unspecified: Secondary | ICD-10-CM | POA: Diagnosis present

## 2013-01-07 DIAGNOSIS — R011 Cardiac murmur, unspecified: Secondary | ICD-10-CM | POA: Diagnosis present

## 2013-01-07 DIAGNOSIS — I440 Atrioventricular block, first degree: Secondary | ICD-10-CM | POA: Diagnosis present

## 2013-01-07 DIAGNOSIS — N182 Chronic kidney disease, stage 2 (mild): Secondary | ICD-10-CM | POA: Diagnosis present

## 2013-01-07 DIAGNOSIS — D696 Thrombocytopenia, unspecified: Secondary | ICD-10-CM | POA: Diagnosis present

## 2013-01-07 DIAGNOSIS — W010XXA Fall on same level from slipping, tripping and stumbling without subsequent striking against object, initial encounter: Secondary | ICD-10-CM | POA: Diagnosis present

## 2013-01-07 DIAGNOSIS — S72001D Fracture of unspecified part of neck of right femur, subsequent encounter for closed fracture with routine healing: Secondary | ICD-10-CM

## 2013-01-07 DIAGNOSIS — D72829 Elevated white blood cell count, unspecified: Secondary | ICD-10-CM | POA: Diagnosis present

## 2013-01-07 DIAGNOSIS — K449 Diaphragmatic hernia without obstruction or gangrene: Secondary | ICD-10-CM | POA: Diagnosis present

## 2013-01-07 DIAGNOSIS — N179 Acute kidney failure, unspecified: Secondary | ICD-10-CM | POA: Diagnosis not present

## 2013-01-07 DIAGNOSIS — S72033A Displaced midcervical fracture of unspecified femur, initial encounter for closed fracture: Principal | ICD-10-CM | POA: Diagnosis present

## 2013-01-07 DIAGNOSIS — J438 Other emphysema: Secondary | ICD-10-CM | POA: Diagnosis present

## 2013-01-07 DIAGNOSIS — I714 Abdominal aortic aneurysm, without rupture: Secondary | ICD-10-CM

## 2013-01-07 DIAGNOSIS — I10 Essential (primary) hypertension: Secondary | ICD-10-CM

## 2013-01-07 DIAGNOSIS — J9610 Chronic respiratory failure, unspecified whether with hypoxia or hypercapnia: Secondary | ICD-10-CM | POA: Diagnosis present

## 2013-01-07 DIAGNOSIS — Z9981 Dependence on supplemental oxygen: Secondary | ICD-10-CM

## 2013-01-07 DIAGNOSIS — F0390 Unspecified dementia without behavioral disturbance: Secondary | ICD-10-CM | POA: Diagnosis present

## 2013-01-07 DIAGNOSIS — R413 Other amnesia: Secondary | ICD-10-CM | POA: Diagnosis present

## 2013-01-07 DIAGNOSIS — I079 Rheumatic tricuspid valve disease, unspecified: Secondary | ICD-10-CM | POA: Diagnosis present

## 2013-01-07 HISTORY — DX: Unspecified injury of lower back, initial encounter: S39.92XA

## 2013-01-07 HISTORY — DX: Unspecified fracture of shaft of humerus, right arm, initial encounter for closed fracture: S42.301A

## 2013-01-07 HISTORY — DX: Joint disorder, unspecified: M25.9

## 2013-01-07 HISTORY — DX: Abdominal aortic aneurysm, without rupture, unspecified: I71.40

## 2013-01-07 HISTORY — DX: Abdominal aortic aneurysm, without rupture: I71.4

## 2013-01-07 LAB — URINALYSIS, ROUTINE W REFLEX MICROSCOPIC
Bilirubin Urine: NEGATIVE
Glucose, UA: NEGATIVE mg/dL
Hgb urine dipstick: NEGATIVE
Ketones, ur: NEGATIVE mg/dL
Leukocytes, UA: NEGATIVE
Nitrite: NEGATIVE
Protein, ur: NEGATIVE mg/dL
Specific Gravity, Urine: 1.018 (ref 1.005–1.030)
Urobilinogen, UA: 0.2 mg/dL (ref 0.0–1.0)
pH: 7.5 (ref 5.0–8.0)

## 2013-01-07 LAB — PROTIME-INR
INR: 1.15 (ref 0.00–1.49)
Prothrombin Time: 14.5 seconds (ref 11.6–15.2)

## 2013-01-07 LAB — BASIC METABOLIC PANEL
BUN: 27 mg/dL — ABNORMAL HIGH (ref 6–23)
CO2: 28 mEq/L (ref 19–32)
Calcium: 9.6 mg/dL (ref 8.4–10.5)
Chloride: 104 mEq/L (ref 96–112)
Creatinine, Ser: 1.42 mg/dL — ABNORMAL HIGH (ref 0.50–1.10)
GFR calc Af Amer: 38 mL/min — ABNORMAL LOW (ref >90)
GFR calc non Af Amer: 33 mL/min — ABNORMAL LOW (ref >90)
Glucose, Bld: 95 mg/dL (ref 70–99)
Potassium: 4.3 mEq/L (ref 3.5–5.1)
Sodium: 143 mEq/L (ref 135–145)

## 2013-01-07 LAB — CBC
HCT: 36 % (ref 36.0–46.0)
Hemoglobin: 11.5 g/dL — ABNORMAL LOW (ref 12.0–15.0)
MCH: 29.9 pg (ref 26.0–34.0)
MCHC: 31.9 g/dL (ref 30.0–36.0)
MCV: 93.8 fL (ref 78.0–100.0)
Platelets: 132 10*3/uL — ABNORMAL LOW (ref 150–400)
RBC: 3.84 MIL/uL — ABNORMAL LOW (ref 3.87–5.11)
RDW: 14 % (ref 11.5–15.5)
WBC: 10.6 10*3/uL — ABNORMAL HIGH (ref 4.0–10.5)

## 2013-01-07 MED ORDER — MORPHINE SULFATE 4 MG/ML IJ SOLN
4.0000 mg | INTRAMUSCULAR | Status: DC | PRN
  Administered 2013-01-07: 4 mg via INTRAVENOUS
  Filled 2013-01-07 (×2): qty 1

## 2013-01-07 MED ORDER — ESCITALOPRAM OXALATE 5 MG PO TABS
5.0000 mg | ORAL_TABLET | Freq: Every day | ORAL | Status: DC
  Administered 2013-01-08 – 2013-01-12 (×4): 5 mg via ORAL
  Filled 2013-01-07 (×5): qty 1

## 2013-01-07 MED ORDER — MORPHINE SULFATE 2 MG/ML IJ SOLN: 0.5000 mg | INTRAMUSCULAR | Status: DC | PRN

## 2013-01-07 MED ORDER — ONDANSETRON HCL 4 MG PO TABS: 4.0000 mg | ORAL_TABLET | Freq: Three times a day (TID) | ORAL | Status: DC | PRN

## 2013-01-07 MED ORDER — HYDROCODONE-ACETAMINOPHEN 5-325 MG PO TABS
1.0000 | ORAL_TABLET | Freq: Four times a day (QID) | ORAL | Status: DC | PRN
  Administered 2013-01-07 – 2013-01-08 (×2): 1 via ORAL
  Administered 2013-01-08: 2 via ORAL
  Filled 2013-01-07 (×2): qty 1
  Filled 2013-01-07: qty 2
  Filled 2013-01-07: qty 1

## 2013-01-07 MED ORDER — DONEPEZIL HCL 5 MG PO TABS
5.0000 mg | ORAL_TABLET | Freq: Every day | ORAL | Status: DC
  Administered 2013-01-07 – 2013-01-10 (×4): 5 mg via ORAL
  Filled 2013-01-07 (×6): qty 1

## 2013-01-07 MED ORDER — ENOXAPARIN SODIUM 30 MG/0.3ML ~~LOC~~ SOLN
30.0000 mg | SUBCUTANEOUS | Status: DC
  Administered 2013-01-07 – 2013-01-09 (×3): 30 mg via SUBCUTANEOUS
  Filled 2013-01-07 (×4): qty 0.3

## 2013-01-07 MED ORDER — QUETIAPINE FUMARATE 25 MG PO TABS
25.0000 mg | ORAL_TABLET | Freq: Two times a day (BID) | ORAL | Status: DC
  Administered 2013-01-07 – 2013-01-12 (×8): 25 mg via ORAL
  Filled 2013-01-07 (×11): qty 1

## 2013-01-07 MED ORDER — BUDESONIDE-FORMOTEROL FUMARATE 160-4.5 MCG/ACT IN AERO
2.0000 | INHALATION_SPRAY | Freq: Two times a day (BID) | RESPIRATORY_TRACT | Status: DC
  Administered 2013-01-07 – 2013-01-11 (×7): 2 via RESPIRATORY_TRACT
  Filled 2013-01-07: qty 6

## 2013-01-07 MED ORDER — ONDANSETRON HCL 4 MG PO TABS: 4.0000 mg | ORAL_TABLET | ORAL | Status: DC | PRN

## 2013-01-07 MED ORDER — LISINOPRIL 5 MG PO TABS
5.0000 mg | ORAL_TABLET | Freq: Every day | ORAL | Status: DC
  Administered 2013-01-08: 5 mg via ORAL
  Filled 2013-01-07: qty 1

## 2013-01-07 MED ORDER — FOLIC ACID 1 MG PO TABS
1.0000 mg | ORAL_TABLET | Freq: Every day | ORAL | Status: DC
  Administered 2013-01-08 – 2013-01-12 (×4): 1 mg via ORAL
  Filled 2013-01-07 (×5): qty 1

## 2013-01-07 MED ORDER — SODIUM CHLORIDE 0.9 % IV SOLN
INTRAVENOUS | Status: DC
  Administered 2013-01-07: 18:00:00 via INTRAVENOUS

## 2013-01-07 MED ORDER — ONDANSETRON HCL 4 MG/2ML IJ SOLN
4.0000 mg | Freq: Once | INTRAMUSCULAR | Status: AC
  Administered 2013-01-07: 4 mg via INTRAVENOUS
  Filled 2013-01-07: qty 2

## 2013-01-07 MED ORDER — DIAZEPAM 5 MG PO TABS
2.5000 mg | ORAL_TABLET | Freq: Every evening | ORAL | Status: DC | PRN
  Administered 2013-01-07 – 2013-01-09 (×2): 2.5 mg via ORAL
  Filled 2013-01-07 (×2): qty 1

## 2013-01-07 MED ORDER — FERROUS SULFATE 325 (65 FE) MG PO TABS
325.0000 mg | ORAL_TABLET | Freq: Every day | ORAL | Status: DC
  Administered 2013-01-09 – 2013-01-10 (×2): 325 mg via ORAL
  Filled 2013-01-07 (×4): qty 1

## 2013-01-07 NOTE — ED Notes (Signed)
PER EMS- pt picked up from home with c/o fall.  Pt was checking tomatoes and tripped over roots.  Pt c/o r hip pain. Denies LOC or head injury.  EMS reports shortening and rotation of r leg.  Arrived to ED on lsd and c collar. PTA given of fentanyl.

## 2013-01-07 NOTE — ED Notes (Signed)
[id number]<JY> Expected date:<BR> Expected time:<BR> Means of arrival:<BR> Comments:<BR> EMS; Fall/hip pain

## 2013-01-07 NOTE — Progress Notes (Signed)
Proxy forms for My Chart given to pt's daughter. Briscoe Burns BSN, RN-BC Admissions RN  01/07/2013 7:30 PM

## 2013-01-07 NOTE — ED Provider Notes (Signed)
CSN: [id number]     Arrival date & time 01/07/13  1641 History     First MD Initiated Contact with Patient 01/07/13 1657     Chief Complaint  Patient presents with  . Fall   (Consider location/radiation/quality/duration/timing/severity/associated sxs/prior Treatment) HPI  79 year old female with right hip/right groin pain. Acute onset this before arrival. Patient was at home. She was in her garden bent over to pickup an apple. She lost her balance and fall onto her right side. She denies seeing her head. No loss of consciousness. Denies headache, neck or back pain. She is at her baseline mental status per her daughter at bedside. She has a past history of DVT, but no current blood thinners. No acute numbness, tingling or loss of strength.   Past Medical History  Diagnosis Date  . Hypertension   . Memory loss   . Anemia   . Chronic back pain   . Chronic kidney disease   . Anxiety   . Panic attack   . Chronic respiratory failure 04/30/2012  . COPD (chronic obstructive pulmonary disease)   . AAA (abdominal aortic aneurysm)   . Cancer     skin Left wrist BCC  . DVT (deep venous thrombosis)    Past Surgical History  Procedure Laterality Date  . Hemorrhoid surgery     Family History  Problem Relation Age of Onset  . Other Mother   . AAA (abdominal aortic aneurysm) Mother   . Heart attack Mother   . Bladder Cancer Sister   . Breast cancer Sister   . Lung cancer Sister   . Colon cancer Brother   . Colon cancer Brother   . Hyperlipidemia Father   . Hypertension Father   . Heart disease Father     Heart Disease before age 72   History  Substance Use Topics  . Smoking status: Former Smoker -- 1.00 packs/day for 25 years    Types: Cigarettes    Quit date: 04/25/2012  . Smokeless tobacco: Never Used     Comment: PATIENT QUIT AFTER FALL ON 04/2012  . Alcohol Use: No   OB History   Grav Para Term Preterm Abortions TAB SAB Ect Mult Living                 Review of  Systems  All systems reviewed and negative, other than as noted in HPI.   Allergies  Biaxin; Erythromycin; Keflex; Macrodantin; Penicillins; and Sulfa antibiotics  Home Medications   Current Outpatient Rx  Name  Route  Sig  Dispense  Refill  . acetaminophen (TYLENOL) 500 MG tablet   Oral   Take 500 mg by mouth every 6 (six) hours as needed for pain.         . budesonide-formoterol (SYMBICORT) 160-4.5 MCG/ACT inhaler   Inhalation   Inhale 2 puffs into the lungs 2 (two) times daily.   1 Inhaler   12   . cyanocobalamin (,VITAMIN B-12,) 1000 MCG/ML injection   Intramuscular   Inject 1,000 mcg into the muscle every 30 (thirty) days.         . diazepam (VALIUM) 5 MG tablet   Oral   Take 0.5 tablets (2.5 mg total) by mouth at bedtime as needed for sleep. Sleep   30 tablet      . donepezil (ARICEPT) 5 MG tablet   Oral   Take 5 mg by mouth at bedtime.          Marland Kitchen escitalopram (LEXAPRO) 5 MG  tablet   Oral   Take 5 mg by mouth daily.         . ferrous sulfate 325 (65 FE) MG tablet   Oral   Take 325 mg by mouth daily with breakfast.         . folic acid (FOLVITE) 1 MG tablet   Oral   Take 1 mg by mouth daily.         Marland Kitchen lisinopril (PRINIVIL,ZESTRIL) 5 MG tablet   Oral   Take 5 mg by mouth daily.         . Multiple Vitamin (MULTIVITAMIN WITH MINERALS) TABS   Oral   Take 1 tablet by mouth daily.         . QUEtiapine (SEROQUEL) 25 MG tablet   Oral   Take 25 mg by mouth 2 (two) times daily.         . Vitamin D, Ergocalciferol, (DRISDOL) 50000 UNITS CAPS   Oral   Take 50,000 Units by mouth every 7 (seven) days. Takes on Mondays          BP 156/58  Pulse 72  Temp(Src) 98.3 F (36.8 C) (Oral)  Resp 12  SpO2 100% Physical Exam  Nursing note and vitals reviewed. Constitutional: She appears well-developed and well-nourished. No distress.  Hard of hearing  HENT:  Head: Normocephalic and atraumatic.  Eyes: Conjunctivae are normal. Right eye  exhibits no discharge. Left eye exhibits no discharge.  Neck: Neck supple.  Cardiovascular: Normal rate, regular rhythm and normal heart sounds.  Exam reveals no gallop and no friction rub.   No murmur heard. Pulmonary/Chest: Effort normal and breath sounds normal. No respiratory distress.  Abdominal: Soft. She exhibits no distension. There is no tenderness.  Musculoskeletal: She exhibits no edema and no tenderness.  Right lower extremity is slightly shortened and internally rotated. Severe pain with attempted ROM of R hip.  Her foot is warm. Easily palpable DP pulse. Able to wiggle toes. Sensation intact to light touch.   Neurological: She is alert.  Skin: Skin is warm and dry.  Psychiatric: She has a normal mood and affect. Her behavior is normal. Thought content normal.    ED Course   Procedures (including critical care time)  Labs Reviewed  CBC - Abnormal; Notable for the following:    WBC 10.6 (*)    RBC 3.84 (*)    Hemoglobin 11.5 (*)    Platelets 132 (*)    All other components within normal limits  BASIC METABOLIC PANEL - Abnormal; Notable for the following:    BUN 27 (*)    Creatinine, Ser 1.42 (*)    GFR calc non Af Amer 33 (*)    GFR calc Af Amer 38 (*)    All other components within normal limits  URINALYSIS, ROUTINE W REFLEX MICROSCOPIC   Dg Chest 1 View  01/07/2013   *RADIOLOGY REPORT*  Clinical Data: Preoperative respiratory exam for right hip fracture.  CHEST - 1 VIEW  Comparison: 05/12/2012  Findings: There is a large hiatal hernia behind the heart which is similar in appearance to the prior chest x-ray.  There is no evidence of pulmonary infiltrate, edema or pleural fluid.  The heart is mildly enlarged.  No fractures are identified in the chest.  IMPRESSION: Similar appearance of large hiatal hernia and mild cardiomegaly. No active disease in the chest.   Original Report Authenticated By: Irish Lack, M.D.   Dg Hip Complete Right  01/07/2013   *RADIOLOGY  REPORT*  Clinical Data: Fall with right hip deformity.  RIGHT HIP - COMPLETE 2+ VIEW  Comparison: None.  Findings: Acute subcapital hip fracture is noted which traverses the entire femoral neck.  Associated angulation and impaction present.  No evidence of dislocation.  The rest of the bony pelvis appears intact.  IMPRESSION: Acute right subcapital hip fracture.   Original Report Authenticated By: Irish Lack, M.D.   1. Closed right hip fracture, initial encounter     MDM  [phone] with R hip/groin pain. Likely hip fracture. Pain control. Imaging and basic pre-op studies.   6:10 PM Case was discussed with Dr. Yisroel Ramming, orthopedic surgery. Will see patient in consultation, possibly later tonight. Tentative plan to take her to the operating room tomorrow. N.p.o. after midnight.  Raeford Razor, MD 01/07/13 [phone]

## 2013-01-07 NOTE — H&P (Signed)
[facility] History and Physical  CAMRIE STOCK HQI:[id number] DOB: [birth year] DOA: 01/07/2013  Referring physician: ED physician PCP: Hollice Espy, MD   Chief Complaint: FAll and subsequent right hip pain  HPI:  79 year old female with HTN, tobacco use, COPD/emhysema, presented to [facility] ED after a fall at home. She explains she was trying to pick up an apple in her garden and lost her balance and fell on her right side. She reports right side of the hip is no painful, intermittent and sharp pain is 7/10 in severity when present, worse with movement and with no specific alleviating factors, no similar events in the past, no specific radiating symptoms. Pt denies fevers, chills, no specific neurological symptoms of numbness or tingling, weakness, nochest pain, no shortness of breath, no specific abdominal concerns.   In ED, right hip area xray demonstrated right hip fracture. [facility] asked to admit for further management and ortho team was consulted by ED doctor, Dr. Juleen China.  Assessment and Plan:  Principal Problem:   Fall - appears to be mechanical fall - will provide supportive care with analgesia as needed - will need PT evaluation post surgery  Active Problems:   ARF (acute renal failure) - this is likely secondary to pre renal etiology and secondary to poor oral intake - will provide IVF and repeat BMP in AM   Hip fracture, right - pre ortho team, they will see in consultation    Anemia of chronic disease - mild, Hg and Hct stable and at pt's baseline - CBC in AM   Leukocytosis, unspecified - mild, likely secondary to acute stress and demargination in the setting of fall and hip fracture  - no findings on exam concerning for an infectious etiology - will repeat CBC in AM   COPD (chronic obstructive pulmonary disease) with emphysema - appears to be stable and at pt's baseline - monitor vitals per floor protocol   Code Status: Full Family Communication: Pt and daughter at  bedside Disposition Plan: Admit to medical floor    Review of Systems:  Constitutional: Negative for fever, chills. Negative for diaphoresis.  HENT: Negative for hearing loss, ear pain, nosebleeds, congestion, sore throat, neck pain, tinnitus and ear discharge.   Eyes: Negative for blurred vision, double vision, photophobia, pain, discharge and redness.  Respiratory: Negative for cough, hemoptysis, sputum production, shortness of breath, wheezing and stridor.   Cardiovascular: Negative for chest pain, palpitations, orthopnea, claudication and leg swelling.  Gastrointestinal: Negative for nausea, vomiting and abdominal pain. Negative for heartburn, constipation, blood in stool and melena.  Genitourinary: Negative for dysuria, urgency, frequency, hematuria and flank pain.  Musculoskeletal: Negative for myalgias, back pain, joint pain.  Skin: Negative for itching and rash.  Neurological:  Negative for tingling, tremors, sensory change, speech change, focal weakness, loss of consciousness and headaches.  Endo/Heme/Allergies: Negative for environmental allergies and polydipsia. Does not bruise/bleed easily.  Psychiatric/Behavioral: Negative for suicidal ideas. The patient is not nervous/anxious.      Past Medical History  Diagnosis Date  . Hypertension   . Memory loss   . Anemia   . Chronic back pain   . Chronic kidney disease   . Anxiety   . Panic attack   . Chronic respiratory failure 04/30/2012  . COPD (chronic obstructive pulmonary disease)   . AAA (abdominal aortic aneurysm)   . Cancer     skin Left wrist BCC  . DVT (deep venous thrombosis)   . Aortic aneurysm, abdominal   . Back  injury     has had broken back x 2  . Knee problem     Right knee pain  . Arm fracture, right     Past Surgical History  Procedure Laterality Date  . Hemorrhoid surgery      Social History:  reports that she quit smoking about 8 months ago. Her smoking use included Cigarettes. She has a 63  pack-year smoking history. She has never used smokeless tobacco. She reports that she does not drink alcohol or use illicit drugs.  Allergies  Allergen Reactions  . Biaxin (Clarithromycin)   . Erythromycin   . Keflex (Cephalexin)   . Macrodantin (Nitrofurantoin Macrocrystal)   . Penicillins   . Sulfa Antibiotics     Family History  Problem Relation Age of Onset  . Other Mother   . AAA (abdominal aortic aneurysm) Mother   . Heart attack Mother   . Bladder Cancer Sister   . Breast cancer Sister   . Lung cancer Sister   . Colon cancer Brother   . Colon cancer Brother   . Hyperlipidemia Father   . Hypertension Father   . Heart disease Father     Heart Disease before age 19    Medication Sig  acetaminophen (TYLENOL) 500 MG tablet Take 500 mg by mouth every 6 (six) hours as needed for pain.  budesonide-formoterol (SYMBICORT) 160-4.5 MCG/ACT inhaler Inhale 2 puffs into the lungs 2 (two) times daily.  diazepam (VALIUM) 5 MG tablet Take 0.5 tablets (2.5 mg total) by mouth at bedtime as needed for sleep. Sleep  donepezil (ARICEPT) 5 MG tablet Take 5 mg by mouth at bedtime.   escitalopram (LEXAPRO) 5 MG tablet Take 5 mg by mouth daily.  ferrous sulfate 325 (65 FE) MG tablet Take 325 mg by mouth daily with breakfast.  folic acid (FOLVITE) 1 MG tablet Take 1 mg by mouth daily.  lisinopril (PRINIVIL,ZESTRIL) 5 MG tablet Take 5 mg by mouth daily.  QUEtiapine (SEROQUEL) 25 MG tablet Take 25 mg by mouth 2 (two) times daily.    Physical Exam: Filed Vitals:   01/07/13 1647 01/07/13 1654 01/07/13 1905  BP:  156/58 132/64  Pulse:  72 72  Temp:  98.3 F (36.8 C)   TempSrc:  Oral   Resp:  12 18  SpO2: 98% 100% 98%    Physical Exam  Constitutional: Appears well-developed and well-nourished. No distress.  HENT: Normocephalic. External right and left ear normal. Dry MM Eyes: Conjunctivae and EOM are normal. PERRLA, no scleral icterus.  Neck: Normal ROM. Neck supple. No JVD. No tracheal  deviation. No thyromegaly.  CVS: RRR, S1/S2 +, no murmurs, no gallops, no carotid bruit.  Pulmonary: Effort and breath sounds normal, no stridor, rhonchi, wheezes, rales.  Abdominal: Soft. BS +,  no distension, tenderness, rebound or guarding.  Musculoskeletal: Normal range of motion. Tenderness over the right side of the hip area, difficulty with movement Lymphadenopathy: No lymphadenopathy noted, cervical, inguinal. Neuro: Alert. Normal reflexes, muscle tone coordination. No cranial nerve deficit. Skin: Skin is warm and dry. No rash noted. Not diaphoretic. No erythema. No pallor.  Psychiatric: Normal mood and affect. Behavior, judgment, thought content normal.   Labs on Admission:  Basic Metabolic Panel:  Recent Labs Lab 01/07/13 1755  NA 143  K 4.3  CL 104  CO2 28  GLUCOSE 95  BUN 27*  CREATININE 1.42*  CALCIUM 9.6   CBC:  Recent Labs Lab 01/07/13 1755  WBC 10.6*  HGB 11.5*  HCT  36.0  MCV 93.8  PLT 132*   Radiological Exams on Admission: Dg Chest 1 View  01/07/2013   Similar appearance of large hiatal hernia and mild cardiomegaly. No active disease in the chest.     Dg Hip Complete Right  01/07/2013   Acute right subcapital hip fracture.     EKG: Normal sinus rhythm, no ST/T wave changes  Debbora Presto, MD  [facility] Pager [phone]  If 7PM-7AM, please contact night-coverage [email or web address] Password [facility] 01/07/2013, 7:24 PM

## 2013-01-07 NOTE — Consult Note (Signed)
Reason for Consult:   Right hip pain Referring Physician:    Juleen China and medical team  [name] is an 79 y.o. female  Who normally lives independently. Went outside to calm a barking dog and was picking up an apple when she fell and could not get up.  Denies LOC and pain other than at her hip.  Denies antacedant chest pain as well.  Seen in her room with four family members.    Past Medical History  Diagnosis Date  . Hypertension   . Memory loss   . Anemia   . Chronic back pain   . Chronic kidney disease   . Anxiety   . Panic attack   . Chronic respiratory failure 04/30/2012  . COPD (chronic obstructive pulmonary disease)   . AAA (abdominal aortic aneurysm)   . Cancer     skin Left wrist BCC  . DVT (deep venous thrombosis)   . Aortic aneurysm, abdominal   . Back injury     has had broken back x 2  . Knee problem     Right knee pain  . Arm fracture, right     Past Surgical History  Procedure Laterality Date  . Hemorrhoid surgery      Family History  Problem Relation Age of Onset  . Other Mother   . AAA (abdominal aortic aneurysm) Mother   . Heart attack Mother   . Bladder Cancer Sister   . Breast cancer Sister   . Lung cancer Sister   . Colon cancer Brother   . Colon cancer Brother   . Hyperlipidemia Father   . Hypertension Father   . Heart disease Father     Heart Disease before age 49    Social History:  reports that she quit smoking about 8 months ago. Her smoking use included Cigarettes. She has a 63 pack-year smoking history. She has never used smokeless tobacco. She reports that she does not drink alcohol or use illicit drugs.  Allergies:  Allergies  Allergen Reactions  . Biaxin (Clarithromycin)   . Erythromycin   . Keflex (Cephalexin)   . Macrodantin (Nitrofurantoin Macrocrystal)   . Penicillins   . Sulfa Antibiotics     Medications: I have reviewed the patient's current medications.  Results for orders placed during the hospital encounter of  01/07/13 (from the past 48 hour(s))  CBC     Status: Abnormal   Collection Time    01/07/13  5:55 PM      Result Value Range   WBC 10.6 (*) 4.0 - 10.5 K/uL   RBC 3.84 (*) 3.87 - 5.11 MIL/uL   Hemoglobin 11.5 (*) 12.0 - 15.0 g/dL   HCT 16.1  09.6 - 04.5 %   MCV 93.8  78.0 - 100.0 fL   MCH 29.9  26.0 - 34.0 pg   MCHC 31.9  30.0 - 36.0 g/dL   RDW 40.9  81.1 - 91.4 %   Platelets 132 (*) 150 - 400 K/uL  BASIC METABOLIC PANEL     Status: Abnormal   Collection Time    01/07/13  5:55 PM      Result Value Range   Sodium 143  135 - 145 mEq/L   Potassium 4.3  3.5 - 5.1 mEq/L   Chloride 104  96 - 112 mEq/L   CO2 28  19 - 32 mEq/L   Glucose, Bld 95  70 - 99 mg/dL   BUN 27 (*) 6 - 23 mg/dL  Creatinine, Ser 1.42 (*) 0.50 - 1.10 mg/dL   Calcium 9.6  8.4 - 16.1 mg/dL   GFR calc non Af Amer 33 (*) >90 mL/min   GFR calc Af Amer 38 (*) >90 mL/min   Comment:            The eGFR has been calculated     using the CKD EPI equation.     This calculation has not been     validated in all clinical     situations.     eGFR's persistently     <90 mL/min signify     possible Chronic Kidney Disease.  URINALYSIS, ROUTINE W REFLEX MICROSCOPIC     Status: None   Collection Time    01/07/13  6:32 PM      Result Value Range   Color, Urine YELLOW  YELLOW   APPearance CLEAR  CLEAR   Specific Gravity, Urine 1.018  1.005 - 1.030   pH 7.5  5.0 - 8.0   Glucose, UA NEGATIVE  NEGATIVE mg/dL   Hgb urine dipstick NEGATIVE  NEGATIVE   Bilirubin Urine NEGATIVE  NEGATIVE   Ketones, ur NEGATIVE  NEGATIVE mg/dL   Protein, ur NEGATIVE  NEGATIVE mg/dL   Urobilinogen, UA 0.2  0.0 - 1.0 mg/dL   Nitrite NEGATIVE  NEGATIVE   Leukocytes, UA NEGATIVE  NEGATIVE   Comment: MICROSCOPIC NOT DONE ON URINES WITH NEGATIVE PROTEIN, BLOOD, LEUKOCYTES, NITRITE, OR GLUCOSE <1000 mg/dL.    Dg Chest 1 View  01/07/2013   *RADIOLOGY REPORT*  Clinical Data: Preoperative respiratory exam for right hip fracture.  CHEST - 1 VIEW   Comparison: 05/12/2012  Findings: There is a large hiatal hernia behind the heart which is similar in appearance to the prior chest x-ray.  There is no evidence of pulmonary infiltrate, edema or pleural fluid.  The heart is mildly enlarged.  No fractures are identified in the chest.  IMPRESSION: Similar appearance of large hiatal hernia and mild cardiomegaly. No active disease in the chest.   Original Report Authenticated By: Irish Lack, M.D.   Dg Hip Complete Right  01/07/2013   *RADIOLOGY REPORT*  Clinical Data: Fall with right hip deformity.  RIGHT HIP - COMPLETE 2+ VIEW  Comparison: None.  Findings: Acute subcapital hip fracture is noted which traverses the entire femoral neck.  Associated angulation and impaction present.  No evidence of dislocation.  The rest of the bony pelvis appears intact.  IMPRESSION: Acute right subcapital hip fracture.   Original Report Authenticated By: Irish Lack, M.D.    @ROS @ Blood pressure 149/76, pulse 65, temperature 98.5 F (36.9 C), temperature source Oral, resp. rate 18, SpO2 92.00%.  PHYSICAL EXAM:   ABD soft Neurovascular intact Sensation intact distally Intact pulses distally Dorsiflexion/Plantar flexion intact Compartment soft  Right leg SER and painful to motion Skin benign Other leg and both arms move fully c spine nontender and head atraumatic  ASSESSMENT:   Right hip displaced subcapital fracture   PLAN:   Needs hemi in order to sit, stand, and potentially walk.  Reviewed risks of surgery including anesthesia, infection, neurovascular injury, dislocation, and even death but stressed risks of nonoperative management probably as great if not greater.  Patient and family in agreement to proceed with surgery tomorrow pending medical clearance.  Dr Ave Filter will be able to do this earlier than I and he will perform surgery.  He will meet her in AM.   Hendrixx Severin G 01/07/2013, 8:47 PM

## 2013-01-08 ENCOUNTER — Encounter (HOSPITAL_COMMUNITY): Payer: Self-pay | Admitting: Registered Nurse

## 2013-01-08 ENCOUNTER — Inpatient Hospital Stay (HOSPITAL_COMMUNITY): Payer: Medicare Other | Admitting: Registered Nurse

## 2013-01-08 ENCOUNTER — Encounter (HOSPITAL_COMMUNITY)
Admission: EM | Disposition: A | Discharge status: Skilled Nursing Facility | Payer: Self-pay | Source: Home / Self Care | Attending: Internal Medicine

## 2013-01-08 ENCOUNTER — Inpatient Hospital Stay (HOSPITAL_COMMUNITY): Payer: Medicare Other

## 2013-01-08 DIAGNOSIS — D638 Anemia in other chronic diseases classified elsewhere: Secondary | ICD-10-CM

## 2013-01-08 DIAGNOSIS — S72009D Fracture of unspecified part of neck of unspecified femur, subsequent encounter for closed fracture with routine healing: Secondary | ICD-10-CM

## 2013-01-08 DIAGNOSIS — N179 Acute kidney failure, unspecified: Secondary | ICD-10-CM

## 2013-01-08 HISTORY — PX: HIP ARTHROPLASTY: SHX981

## 2013-01-08 HISTORY — PX: FRACTURE SURGERY: SHX138

## 2013-01-08 LAB — CBC
Platelets: 119 10*3/uL — ABNORMAL LOW (ref 150–400)
RDW: 14 % (ref 11.5–15.5)
WBC: 7.5 10*3/uL (ref 4.0–10.5)

## 2013-01-08 LAB — SURGICAL PCR SCREEN: MRSA, PCR: NEGATIVE

## 2013-01-08 LAB — BASIC METABOLIC PANEL
Chloride: 105 mEq/L (ref 96–112)
Creatinine, Ser: 1.46 mg/dL — ABNORMAL HIGH (ref 0.50–1.10)
GFR calc Af Amer: 37 mL/min — ABNORMAL LOW (ref >90)
Potassium: 4.6 mEq/L (ref 3.5–5.1)

## 2013-01-08 SURGERY — HEMIARTHROPLASTY, HIP, DIRECT ANTERIOR APPROACH, FOR FRACTURE
Anesthesia: Spinal | Laterality: Right | Wound class: Clean

## 2013-01-08 MED ORDER — MORPHINE SULFATE 10 MG/ML IJ SOLN: 1.0000 mg | INTRAMUSCULAR | Status: DC | PRN

## 2013-01-08 MED ORDER — BUPIVACAINE HCL (PF) 0.5 % IJ SOLN
INTRAMUSCULAR | Status: DC | PRN
  Administered 2013-01-08: 3 mL

## 2013-01-08 MED ORDER — ENSURE COMPLETE PO LIQD
237.0000 mL | Freq: Two times a day (BID) | ORAL | Status: DC
  Administered 2013-01-09 – 2013-01-12 (×5): 237 mL via ORAL
  Filled 2013-01-08 (×2): qty 237

## 2013-01-08 MED ORDER — ADULT MULTIVITAMIN W/MINERALS CH
1.0000 | ORAL_TABLET | Freq: Every day | ORAL | Status: DC
  Administered 2013-01-09 – 2013-01-12 (×3): 1 via ORAL
  Filled 2013-01-08 (×5): qty 1

## 2013-01-08 MED ORDER — ASPIRIN EC 325 MG PO TBEC
325.0000 mg | DELAYED_RELEASE_TABLET | Freq: Two times a day (BID) | ORAL | Status: DC
  Administered 2013-01-08 – 2013-01-12 (×6): 325 mg via ORAL
  Filled 2013-01-08 (×9): qty 1

## 2013-01-08 MED ORDER — MEPERIDINE HCL 50 MG/ML IJ SOLN: 6.2500 mg | INTRAMUSCULAR | Status: DC | PRN

## 2013-01-08 MED ORDER — LACTATED RINGERS IV SOLN
INTRAVENOUS | Status: DC | PRN
  Administered 2013-01-08 (×2): via INTRAVENOUS

## 2013-01-08 MED ORDER — OXYCODONE HCL 5 MG PO TABS: 5.0000 mg | ORAL_TABLET | ORAL | Status: DC | PRN

## 2013-01-08 MED ORDER — MENTHOL 3 MG MT LOZG
1.0000 | LOZENGE | OROMUCOSAL | Status: DC | PRN
  Filled 2013-01-08: qty 9

## 2013-01-08 MED ORDER — SODIUM CHLORIDE 0.9 % IV SOLN
INTRAVENOUS | Status: AC
  Administered 2013-01-08: 22:00:00 via INTRAVENOUS

## 2013-01-08 MED ORDER — ONDANSETRON HCL 4 MG/2ML IJ SOLN: 4.0000 mg | Freq: Four times a day (QID) | INTRAMUSCULAR | Status: DC | PRN

## 2013-01-08 MED ORDER — FENTANYL CITRATE 0.05 MG/ML IJ SOLN: 25.0000 ug | INTRAMUSCULAR | Status: DC | PRN

## 2013-01-08 MED ORDER — METOCLOPRAMIDE HCL 5 MG/ML IJ SOLN: 5.0000 mg | Freq: Three times a day (TID) | INTRAMUSCULAR | Status: DC | PRN

## 2013-01-08 MED ORDER — ONDANSETRON HCL 4 MG PO TABS: 4.0000 mg | ORAL_TABLET | Freq: Four times a day (QID) | ORAL | Status: DC | PRN

## 2013-01-08 MED ORDER — ACETAMINOPHEN 325 MG PO TABS
650.0000 mg | ORAL_TABLET | Freq: Four times a day (QID) | ORAL | Status: DC | PRN
  Administered 2013-01-09 – 2013-01-12 (×5): 650 mg via ORAL
  Filled 2013-01-08 (×5): qty 2

## 2013-01-08 MED ORDER — METOCLOPRAMIDE HCL 5 MG PO TABS
5.0000 mg | ORAL_TABLET | Freq: Three times a day (TID) | ORAL | Status: DC | PRN
  Filled 2013-01-08: qty 2

## 2013-01-08 MED ORDER — PHENOL 1.4 % MT LIQD
1.0000 | OROMUCOSAL | Status: DC | PRN
  Filled 2013-01-08: qty 177

## 2013-01-08 MED ORDER — CLINDAMYCIN PHOSPHATE 600 MG/50ML IV SOLN
600.0000 mg | INTRAVENOUS | Status: AC
  Administered 2013-01-08: 600 mg via INTRAVENOUS
  Filled 2013-01-08: qty 50

## 2013-01-08 MED ORDER — LIDOCAINE HCL (CARDIAC) 20 MG/ML IV SOLN
INTRAVENOUS | Status: DC | PRN
  Administered 2013-01-08: 100 mg via INTRAVENOUS

## 2013-01-08 MED ORDER — CLINDAMYCIN PHOSPHATE 600 MG/50ML IV SOLN
600.0000 mg | Freq: Four times a day (QID) | INTRAVENOUS | Status: AC
  Administered 2013-01-08 – 2013-01-09 (×2): 600 mg via INTRAVENOUS
  Filled 2013-01-08 (×2): qty 50

## 2013-01-08 MED ORDER — EPHEDRINE SULFATE 50 MG/ML IJ SOLN
INTRAMUSCULAR | Status: DC | PRN
  Administered 2013-01-08: 10 mg via INTRAVENOUS
  Administered 2013-01-08: 5 mg via INTRAVENOUS

## 2013-01-08 MED ORDER — ONDANSETRON HCL 4 MG/2ML IJ SOLN
INTRAMUSCULAR | Status: DC | PRN
  Administered 2013-01-08: 4 mg via INTRAVENOUS

## 2013-01-08 MED ORDER — SODIUM CHLORIDE 0.9 % IR SOLN
Status: DC | PRN
  Administered 2013-01-08: 200 mL

## 2013-01-08 MED ORDER — KETAMINE HCL 10 MG/ML IJ SOLN
INTRAMUSCULAR | Status: DC | PRN
  Administered 2013-01-08: 30 mg via INTRAVENOUS
  Administered 2013-01-08: 20 mg via INTRAVENOUS
  Administered 2013-01-08: 10 mg via INTRAVENOUS

## 2013-01-08 MED ORDER — ACETAMINOPHEN 650 MG RE SUPP: 650.0000 mg | Freq: Four times a day (QID) | RECTAL | Status: DC | PRN

## 2013-01-08 MED ORDER — PROMETHAZINE HCL 25 MG/ML IJ SOLN: 6.2500 mg | INTRAMUSCULAR | Status: DC | PRN

## 2013-01-08 MED ORDER — PROPOFOL INFUSION 10 MG/ML OPTIME
INTRAVENOUS | Status: DC | PRN
  Administered 2013-01-08: 25 ug/kg/min via INTRAVENOUS

## 2013-01-08 MED ORDER — ATROPINE SULFATE 0.4 MG/ML IJ SOLN
INTRAMUSCULAR | Status: DC | PRN
  Administered 2013-01-08: 0.4 mg via INTRAVENOUS

## 2013-01-08 MED ORDER — HYDROCODONE-ACETAMINOPHEN 5-325 MG PO TABS
1.0000 | ORAL_TABLET | Freq: Four times a day (QID) | ORAL | Status: DC | PRN
  Administered 2013-01-09: 1 via ORAL
  Filled 2013-01-08: qty 2

## 2013-01-08 MED ORDER — PHENYLEPHRINE HCL 10 MG/ML IJ SOLN
20.0000 mg | INTRAVENOUS | Status: DC | PRN
  Administered 2013-01-08: 25 ug/min via INTRAVENOUS

## 2013-01-08 SURGICAL SUPPLY — 62 items
BAG SPEC THK2 15X12 ZIP CLS (MISCELLANEOUS)
BAG ZIPLOCK 12X15 (MISCELLANEOUS) ×1 IMPLANT
BIT DRILL 2.8X128 (BIT) IMPLANT
BLADE SAW SAG 73X25 THK (BLADE) ×1
BLADE SAW SGTL 73X25 THK (BLADE) ×1 IMPLANT
BRUSH FEMORAL CANAL (MISCELLANEOUS) ×2 IMPLANT
CANISTER SUCTION 2500CC (MISCELLANEOUS) ×4 IMPLANT
CAPT HIP FX BIPOLAR/UNIPOLAR ×1 IMPLANT
CEMENT BONE DEPUY (Cement) ×4 IMPLANT
CEMENT RESTRICTOR DEPUY SZ 3 (Cement) ×1 IMPLANT
CLOTH BEACON ORANGE TIMEOUT ST (SAFETY) ×2 IMPLANT
COVER SURGICAL LIGHT HANDLE (MISCELLANEOUS) ×2 IMPLANT
DRAPE INCISE IOBAN 66X45 STRL (DRAPES) ×2 IMPLANT
DRAPE ORTHO SPLIT 77X108 STRL (DRAPES) ×4
DRAPE POUCH INSTRU U-SHP 10X18 (DRAPES) ×2 IMPLANT
DRAPE SURG 17X11 SM STRL (DRAPES) ×1 IMPLANT
DRAPE SURG ORHT 6 SPLT 77X108 (DRAPES) ×2 IMPLANT
DRAPE U-SHAPE 47X51 STRL (DRAPES) ×2 IMPLANT
DRSG EMULSION OIL 3X16 NADH (GAUZE/BANDAGES/DRESSINGS) ×1 IMPLANT
DRSG MEPILEX BORDER 4X12 (GAUZE/BANDAGES/DRESSINGS) ×2 IMPLANT
DRSG MEPILEX BORDER 4X8 (GAUZE/BANDAGES/DRESSINGS) ×1 IMPLANT
DRSG PAD ABDOMINAL 8X10 ST (GAUZE/BANDAGES/DRESSINGS) ×2 IMPLANT
DURAPREP 26ML APPLICATOR (WOUND CARE) ×2 IMPLANT
ELECT REM PT RETURN 9FT ADLT (ELECTROSURGICAL) ×2
ELECTRODE REM PT RTRN 9FT ADLT (ELECTROSURGICAL) ×1 IMPLANT
EVACUATOR 1/8 PVC DRAIN (DRAIN) ×1 IMPLANT
FACESHIELD LNG OPTICON STERILE (SAFETY) ×6 IMPLANT
GLOVE BIOGEL PI IND STRL 8 (GLOVE) ×1 IMPLANT
GLOVE BIOGEL PI INDICATOR 8 (GLOVE) ×1
GLOVE ORTHO TXT STRL SZ7.5 (GLOVE) ×4 IMPLANT
GLOVE SURG ORTHO 8.0 STRL STRW (GLOVE) ×2 IMPLANT
GOWN STRL REIN XL XLG (GOWN DISPOSABLE) ×1 IMPLANT
HOOD PEEL AWAY FACE SHEILD DIS (HOOD) ×4 IMPLANT
IMMOBILIZER KNEE 20 (SOFTGOODS)
IMMOBILIZER KNEE 20 THIGH 36 (SOFTGOODS) IMPLANT
IMMOBILIZER KNEE 22  40 CIR (ORTHOPEDIC SUPPLIES) ×1
IMMOBILIZER KNEE 22 40 CIR (ORTHOPEDIC SUPPLIES) IMPLANT
IMMOBILIZER KNEE 22 UNIV (SOFTGOODS) ×1 IMPLANT
KIT BASIN OR (CUSTOM PROCEDURE TRAY) ×2 IMPLANT
MANIFOLD NEPTUNE II (INSTRUMENTS) ×1 IMPLANT
NDL MA TROC 1/2 (NEEDLE) ×1 IMPLANT
NDL MA TROC 1/2 CIR (NEEDLE) ×1 IMPLANT
NEEDLE MA TROC 1/2 (NEEDLE) ×2 IMPLANT
NEEDLE MA TROC 1/2 CIR (NEEDLE) ×2 IMPLANT
NEEDLE MAYO .5 CIRCLE (NEEDLE) ×2 IMPLANT
PACK TOTAL JOINT (CUSTOM PROCEDURE TRAY) ×2 IMPLANT
PASSER SUT SWANSON 36MM LOOP (INSTRUMENTS) ×2 IMPLANT
POSITIONER SURGICAL ARM (MISCELLANEOUS) ×2 IMPLANT
PRESSURIZER FEMORAL UNIV (MISCELLANEOUS) ×2 IMPLANT
SPONGE GAUZE 4X4 12PLY (GAUZE/BANDAGES/DRESSINGS) ×2 IMPLANT
STAPLER VISISTAT 35W (STAPLE) ×1 IMPLANT
STRIP CLOSURE SKIN 1/2X4 (GAUZE/BANDAGES/DRESSINGS) ×3 IMPLANT
SUT ETHIBOND NAB CT1 #1 30IN (SUTURE) ×5 IMPLANT
SUT MNCRL AB 4-0 PS2 18 (SUTURE) ×2 IMPLANT
SUT VIC AB 0 CT1 27 (SUTURE) ×2
SUT VIC AB 0 CT1 27XBRD ANTBC (SUTURE) IMPLANT
SUT VIC AB 1 CT1 27 (SUTURE) ×6
SUT VIC AB 1 CT1 27XBRD ANTBC (SUTURE) ×3 IMPLANT
SUT VIC AB 2-0 CT1 27 (SUTURE) ×6
SUT VIC AB 2-0 CT1 TAPERPNT 27 (SUTURE) ×3 IMPLANT
TOWEL OR 17X26 10 PK STRL BLUE (TOWEL DISPOSABLE) ×4 IMPLANT
TRAY FOLEY CATH 14FRSI W/METER (CATHETERS) ×1 IMPLANT

## 2013-01-08 NOTE — Transfer of Care (Signed)
Immediate Anesthesia Transfer of Care Note  Patient: [name]  Procedure(s) Performed: Procedure(s): ARTHROPLASTY BIPOLAR HIP (Right)  Patient Location: PACU  Anesthesia Type:Spinal  Level of Consciousness: awake and sedated  Airway & Oxygen Therapy: Patient Spontanous Breathing and Patient connected to face mask oxygen  Post-op Assessment: Report given to PACU RN and Post -op Vital signs reviewed and stable  Post vital signs: stable  Complications: No apparent anesthesia complications unable to  Assess spinal level  Due to dementia

## 2013-01-08 NOTE — Progress Notes (Signed)
PATIENT ID: [name]     Procedure(s) (LRB): ARTHROPLASTY BIPOLAR HIP (Right)  Subjective: Comfortable in bed  Objective:  Filed Vitals:   01/08/13 0800  BP:   Pulse:   Temp:   Resp: 16     R LE SER NVID grossly No other apparent orthopaedic injuries  Labs:   Recent Labs  01/07/13 1755 01/08/13 0405  HGB 11.5* 10.0*   Recent Labs  01/07/13 1755 01/08/13 0405  WBC 10.6* 7.5  RBC 3.84* 3.35*  HCT 36.0 31.3*  PLT 132* 119*   Recent Labs  01/07/13 1755 01/08/13 0405  NA 143 141  K 4.3 4.6  CL 104 105  CO2 28 30  BUN 27* 25*  CREATININE 1.42* 1.46*  GLUCOSE 95 96  CALCIUM 9.6 9.1    Assessment and Plan: R displaced femoral neck fracture Asked by Dr. Jerl Santos to take over care of this patient's surgical needs Plan for R hip hemiarthroplasty today Surgery discussed with family last night with Dr. Jerl Santos NPO

## 2013-01-08 NOTE — Anesthesia Procedure Notes (Signed)
Spinal Patient location during procedure: OR Staffing Anesthesiologist: Donnamarie Shankles Performed by: anesthesiologist  Preanesthetic Checklist Completed: patient identified, site marked, surgical consent, pre-op evaluation, timeout performed, IV checked, risks and benefits discussed and monitors and equipment checked Spinal Block Patient position: right lateral decubitus Prep: Betadine Patient monitoring: heart rate, continuous pulse ox and blood pressure Approach: left paramedian Location: L2-3 Injection technique: single-shot Needle Needle type: Spinocan  Needle gauge: 22 G Needle length: 9 cm Additional Notes Expiration date of kit checked and confirmed. Patient tolerated procedure well, without complications.     

## 2013-01-08 NOTE — Op Note (Signed)
Procedure(s): ARTHROPLASTY BIPOLAR HIP Procedure Note  [name] female 79 y.o. 01/08/2013  Procedure(s) and Anesthesia Type:    * RIGHT HIP HEMIARTHROPLASTY  - Spinal  Surgeon(s) and Role:    * Mable Paris, MD - Primary   Indications:  79 y.o. female s/p fall with right hip fracture. Indicated for surgery to promote early ambulation, pain control and prevent complications of bed rest.     Surgeon: Mable Paris   Assistants: Damita Lack PA-C (Danielle was present and scrubbed throughout the procedure and was essential in positioning, retraction, exposure, and closure)  Anesthesia: Spinal anesthesia    Procedure Detail  ARTHROPLASTY BIPOLAR HIP  Findings: DePuy cemented Summit basic size 5 stem, +0 neck length, a 48 head. Excellent stability.  Estimated Blood Loss:  200 mL         Drains: none  Blood Given: none          Specimens: none        Complications:  * No complications entered in OR log *         Disposition: PACU - hemodynamically stable.         Condition: stable    Procedure:  The patient was identified in the preoperative  holding area where I personally marked the operative site after  verifying site side and procedure with the patient. She was taken back  to the operating room where general anesthesia was induced without  Complication. The patient was placed in lateral decubitus position with the  right side up. The right lower extremity was then prepped and draped in standard sterile fashion. The patient did receive IV antibiotics prior to the  incision.   After the appropriate time-out, an approximately 12-cm  incision was made over the posterior third of the greater trochanter.  Dissection was carried down to the fascia which was split longitudinally  in line with the incision. The piriformis was tagged and the external  rotators were then taken down off the posterior greater trochanter in 1  sheath with the  posterior capsule. The fracture was exposed. Posterior  capsule and external rotators were split just below the piriformis down  to the level of the acetabulum. Great care was taken to protect the  sciatic nerve. The proximal femoral cut was then made using the cut  guide and the femoral head was then removed and sized and felt to be 48.  The proximal femur was then prepared by first using an intramedullary  canal finder, a lateralizer and then sequentially broaching from 1 to 5.   The size 48 head with +0 offset neck was then placed and a trial reduction was performed. It was felt  to be excellent in terms of the soft tissue tension. I was able to  bring up to 90 degrees of flexion and 60 degrees internal rotation  without any instability. Leg lengths were felt to be appropriate. The  hip was dislocated. The broach was then removed. The cement restrictor  was then placed and the canal was prepared with pulse lavage with a  brush. The size 5 stem was then cemented into place in approximately 15-20  degrees anteversion. It was held until the cement was hardened and  cool. The size 48 +0 offset head was then placed and impacted. It  was then reduced after ensuring that there was nothing in the  acetabulum. The hip reduced nicely and again it was taken through the  trial. I was able to  flex to 90 degrees, internal rotation to 70  without any instability. Soft tissue tension was excellent and lengths  were felt to be equal. The joint was then copiously irrigated with  normal saline with pulse lavage and then the external rotators were  repaired using Ethibond sutures through the greater trochanter and tied  over a bone bridge. The deep fascia was then closed using #1 Vicryl in  running fashion proximally and distally. The skin was then closed using  2-0 Vicryl in deep dermal layer and Monocryl for skin closure. Sterile  dressings were then applied including Mepilex dressing. The patient was   then placed in a knee immobilizer, rolled into supine position and  extubated. He was then transferred to the PACU in stable condition.    POSTOPERATIVE PLAN: The patient will be weightbearing as tolerated on the operative Extremity with posterior hip precautions and will have DVT prophylaxis of aspirin twice daily for 3 weeks.

## 2013-01-08 NOTE — Anesthesia Preprocedure Evaluation (Addendum)
Anesthesia Evaluation  Patient identified by MRN, date of birth, ID band Patient awake and Patient confused    Reviewed: Allergy & Precautions, H&P , NPO status , Patient's Chart, lab work & pertinent test results  Airway Mallampati: II TM Distance: >3 FB Neck ROM: Full    Dental no notable dental hx.    Pulmonary neg pulmonary ROS, COPD breath sounds clear to auscultation  Pulmonary exam normal       Cardiovascular hypertension, Pt. on medications + Peripheral Vascular Disease (5cm AAA) negative cardio ROS  Rhythm:Regular Rate:Normal     Neuro/Psych Pt demented. Unable to respond appropriately to medical questionsnegative neurological ROS  negative psych ROS   GI/Hepatic negative GI ROS, Neg liver ROS,   Endo/Other  negative endocrine ROS  Renal/GU negative Renal ROS  negative genitourinary   Musculoskeletal negative musculoskeletal ROS (+)   Abdominal   Peds negative pediatric ROS (+)  Hematology negative hematology ROS (+)   Anesthesia Other Findings   Reproductive/Obstetrics negative OB ROS                           Anesthesia Physical Anesthesia Plan  ASA: III  Anesthesia Plan: Spinal   Post-op Pain Management:    Induction:   Airway Management Planned: Simple Face Mask  Additional Equipment:   Intra-op Plan:   Post-operative Plan:   Informed Consent: I have reviewed the patients History and Physical, chart, labs and discussed the procedure including the risks, benefits and alternatives for the proposed anesthesia with the patient or authorized representative who has indicated his/her understanding and acceptance.   Dental advisory given  Plan Discussed with: CRNA  Anesthesia Plan Comments:         Anesthesia Quick Evaluation

## 2013-01-08 NOTE — Anesthesia Postprocedure Evaluation (Signed)
  Anesthesia Post-op Note  Patient: [name]  Procedure(s) Performed: Procedure(s) (LRB): ARTHROPLASTY BIPOLAR HIP (Right)  Patient Location: PACU  Anesthesia Type: Spinal  Level of Consciousness: awake and alert   Airway and Oxygen Therapy: Patient Spontanous Breathing  Post-op Pain: mild  Post-op Assessment: Post-op Vital signs reviewed, Patient's Cardiovascular Status Stable, Respiratory Function Stable, Patent Airway and No signs of Nausea or vomiting  Last Vitals:  Filed Vitals:   01/08/13 1615  BP: 119/53  Pulse: 55  Temp: 36.4 C  Resp: 12    Post-op Vital Signs: stable   Complications: No apparent anesthesia complications

## 2013-01-08 NOTE — Progress Notes (Signed)
Clinical Social Work Department BRIEF PSYCHOSOCIAL ASSESSMENT 01/08/2013  Patient:  ROZA, CREAMER     Account Number:  [id number]     Admit date:  01/07/2013  Clinical Social Worker:  Candie Chroman  Date/Time:  01/08/2013 01:09 PM  Referred by:  Physician  Date Referred:  01/08/2013 Referred for  SNF Placement   Other Referral:   Interview type:  Family Other interview type:    PSYCHOSOCIAL DATA Living Status:  ALONE Admitted from facility:   Level of care:   Primary support name:  Virgilio Belling Primary support relationship to patient:  CHILD, ADULT Degree of support available:   supportive    CURRENT CONCERNS Current Concerns  Post-Acute Placement   Other Concerns:    SOCIAL WORK ASSESSMENT / PLAN Pt is an 79 yr old female living at home, alone, with family supervision during the day. CSW met with pt/daughter in-law and spoke with pt's daughter to assist with d/c planning. Pt is scheduled for surgery today ( hip fx ). Family would like pt to return home following hospital d/c. They feel she will do better recovering in a familiar environment, but will consider PT recommenations. CSW will follow to assist with d/c planning.   Assessment/plan status:  Psychosocial Support/Ongoing Assessment of Needs Other assessment/ plan:   Home vs SNF   Information/referral to community resources:   None at this time.    PATIENT'S/FAMILY'S RESPONSE TO PLAN OF CARE: Pt/family would prefer d/c home and will provide supervision. Pt/family will accept [facility] if d/c home is not recommended.   Cori Razor LCSW [phone]

## 2013-01-08 NOTE — Progress Notes (Addendum)
[facility] PROGRESS NOTE  [name] ZOX:[id number] DOB: June 24, 1926 DOA: 01/07/2013 PCP: Hollice Espy, MD Primary pulmonologist: Dr. Marcelyn Bruins Vascular Surgery: Dr. Waverly Ferrari  Brief narrative 79 year old female patient with history of hypertension, moderate COPD by PFTs, kyphosis, large hiatal hernia, chronic respiratory failure on 2 L of oxygen per minute, anemia, memory loss, COPD, AAA, DVT was admitted on 01/07/13 following a mechanical fall at home leading to right hip fracture. No history of chest pain, dyspnea, palpitations, LOC associated with the fall. No documented history of CAD, arrhythmias or CHF. Patient apparently lives alone and ambulates with the help of a walker. Orthopedics have consulted and plans for surgery on 7/31 afternoon.  Assessment/Plan: 1. Right displaced femoral neck/hip fracture: Orthopedic consultation appreciated-plan for right hip hemiarthroplasty on 7/31. Patient denies history of dyspnea or chest pain. She is at least at moderate risk for perioperative CV events due to advanced age, frailty and chronic respiratory failure from COPD, kyphosis and large hiatal hernia. Patient is stable to proceed for indicated surgery without any further cardiovascular workup. Recommend close monitoring of hemodynamic status and respiratory status perioperatively. Continue oxygen. Aggressive incentive spirometry and mobilize ASAP. 2. Acute renal failure on stage II chronic kidney disease: Creatinine 0.84 in November 2013. Now 1.46. Likely secondary to intravascular volume depletion and ACE inhibitors. Patient has already received her lisinopril this morning-we'll hold and if creatinine has normalized may continue from tomorrow or may have to temperately change to an alternate agent such as amlodipine at low dose. Follow BMP in a.m. 3. Normocytic anemia, chronic & thrombocytopenia: Anemia stable. Etiology of thrombocytopenia unclear. Patient had atypical lymphocytes  in November 2013. May consider outpatient hematology consultation for further evaluation. Follow CBC daily. 4. Hypertension: Controlled. DC lisinopril temporarily. Will add low-dose amlodipine in the interim. 5. Chronic respiratory failure on home O2, COPD, kyphosis, large hiatal hernia: Stable. Continue oxygen. Aggressive incentive spirometry and mobilize ASAP. Continue home respiratory medications. 6. Memory impairment/possible dementia. Patient pleasantly confused. Does not know why she is in the hospital. States that she walks with a walker at home. Does not recollect amount of oxygen she uses.  Code Status: Full Family Communication: None Disposition Plan: Probably SNF when medically stable.   Consultants:  Orthopedics  Procedures:  None  Antibiotics:  None   HPI/Subjective: Pleasantly confused. Denies complaints. Denies pain. No chest pain, dyspnea or palpitations.  Objective: Filed Vitals:   01/07/13 2241 01/08/13 0546 01/08/13 0800 01/08/13 0930  BP: 126/68 124/67    Pulse: 68 51    Temp: 99.4 F (37.4 C) 98.4 F (36.9 C)    TempSrc: Oral Oral    Resp: 16 16 16    Height:      Weight:      SpO2: 93% 100% 100% 97%    Intake/Output Summary (Last 24 hours) at 01/08/13 1014 Last data filed at 01/08/13 0546  Gross per 24 hour  Intake    120 ml  Output    730 ml  Net   -610 ml   Filed Weights   01/07/13 2100  Weight: 63.3 kg (139 lb 8.8 oz)    Exam:   General exam: Comfortable. Frail elderly female lying comfortably supine in bed.  Respiratory system: Clear. No increased work of breathing.  Cardiovascular system: S1 & S2 heard, RRR. No JVD, gallops, clicks or pedal edema.? Grade 2/6 systolic murmur at apex.  Gastrointestinal system: Abdomen is nondistended, soft and nontender. Normal bowel sounds heard.  Central nervous system: Alert  and oriented to self and partly to place. No focal neurological deficits.  Extremities: Symmetric 5 x 5 power except in  right lower extremity were and evaluation limited by pain. Right lower extremity slightly shortened and externally rotated.   Data Reviewed: Basic Metabolic Panel:  Recent Labs Lab 01/07/13 1755 01/08/13 0405  NA 143 141  K 4.3 4.6  CL 104 105  CO2 28 30  GLUCOSE 95 96  BUN 27* 25*  CREATININE 1.42* 1.46*  CALCIUM 9.6 9.1   Liver Function Tests: No results found for this basename: AST, ALT, ALKPHOS, BILITOT, PROT, ALBUMIN,  in the last 168 hours No results found for this basename: LIPASE, AMYLASE,  in the last 168 hours No results found for this basename: AMMONIA,  in the last 168 hours CBC:  Recent Labs Lab 01/07/13 1755 01/08/13 0405  WBC 10.6* 7.5  HGB 11.5* 10.0*  HCT 36.0 31.3*  MCV 93.8 93.4  PLT 132* 119*   Cardiac Enzymes: No results found for this basename: CKTOTAL, CKMB, CKMBINDEX, TROPONINI,  in the last 168 hours BNP (last 3 results) No results found for this basename: PROBNP,  in the last 8760 hours CBG: No results found for this basename: GLUCAP,  in the last 168 hours  No results found for this or any previous visit (from the past 240 hour(s)).   Studies: Dg Chest 1 View  01/07/2013   *RADIOLOGY REPORT*  Clinical Data: Preoperative respiratory exam for right hip fracture.  CHEST - 1 VIEW  Comparison: 05/12/2012  Findings: There is a large hiatal hernia behind the heart which is similar in appearance to the prior chest x-ray.  There is no evidence of pulmonary infiltrate, edema or pleural fluid.  The heart is mildly enlarged.  No fractures are identified in the chest.  IMPRESSION: Similar appearance of large hiatal hernia and mild cardiomegaly. No active disease in the chest.   Original Report Authenticated By: Irish Lack, M.D.   Dg Hip Complete Right  01/07/2013   *RADIOLOGY REPORT*  Clinical Data: Fall with right hip deformity.  RIGHT HIP - COMPLETE 2+ VIEW  Comparison: None.  Findings: Acute subcapital hip fracture is noted which traverses the  entire femoral neck.  Associated angulation and impaction present.  No evidence of dislocation.  The rest of the bony pelvis appears intact.  IMPRESSION: Acute right subcapital hip fracture.   Original Report Authenticated By: Irish Lack, M.D.   EKG 01/07/13: Tremor artifacts. Sinus rhythm with PVCs and no acute changes. Followup EKG 7/31: Sinus bradycardia at 55 beats per minute with first degree AV block without acute changes.  Additional labs:   Scheduled Meds: . budesonide-formoterol  2 puff Inhalation BID  . donepezil  5 mg Oral QHS  . enoxaparin (LOVENOX) injection  30 mg Subcutaneous Q24H  . escitalopram  5 mg Oral Daily  . ferrous sulfate  325 mg Oral Q breakfast  . folic acid  1 mg Oral Daily  . lisinopril  5 mg Oral Daily  . QUEtiapine  25 mg Oral BID   Continuous Infusions:   Principal Problem:   Fall Active Problems:   COPD (chronic obstructive pulmonary disease) with emphysema   Leukocytosis, unspecified   ARF (acute renal failure)   Anemia of chronic disease   Hip fracture, right    Time spent: 30 minutes    [facility]  [facility] Pager [phone].   If 8PM-8AM, please contact night-coverage at [email or web address], password [facility] 01/08/2013, 10:14 AM  LOS: 1 day

## 2013-01-08 NOTE — Progress Notes (Signed)
Utilization review completed.  

## 2013-01-08 NOTE — Progress Notes (Signed)
INITIAL NUTRITION ASSESSMENT  DOCUMENTATION CODES Per approved criteria  -Not Applicable   INTERVENTION: Provide Ensure Complete BID Provide Multivitamin with minerals daily  NUTRITION DIAGNOSIS: Predicted suboptimal energy intake related to recent surgery as evidenced by pt s/p right hip hemiarthroplasty.   Goal: Pt to meet >/= 90% of their estimated nutrition needs   Monitor:  PO intake Weight Labs  Reason for Assessment: Consult  79 y.o. female  Admitting Dx: Hip fracture, right  ASSESSMENT: 79 year old female with HTN, tobacco use, COPD/emhysema, presented to [facility] ED after a fall at home. She explains she was trying to pick up an apple in her garden and lost her balance and fell on her right side. She reports right side of the hip is no painful, intermittent and sharp pain is 7/10 in severity when present, worse with movement and with no specific alleviating factors, no similar events in the past, no specific radiating symptoms. Pt in surgery for right hip hemiarthroplasty at time of visit. RD unable to assess pt at this time. Per weight history pt has maintained weight between 134 and 138 lbs for the past 6 months. Suspect possible poor PO intake during admission due to surgery and pain.    Height: Ht Readings from Last 1 Encounters:  01/07/13 5\' 6"  (1.676 m)    Weight: Wt Readings from Last 1 Encounters:  01/07/13 139 lb 8.8 oz (63.3 kg)    Ideal Body Weight: 130 lbs  % Ideal Body Weight: 107%  Wt Readings from Last 10 Encounters:  01/07/13 139 lb 8.8 oz (63.3 kg)  01/07/13 139 lb 8.8 oz (63.3 kg)  11/26/12 138 lb (62.596 kg)  10/28/12 137 lb 9.6 oz (62.415 kg)  07/29/12 134 lb (60.782 kg)  06/27/12 134 lb (60.782 kg)  05/28/12 131 lb 8 oz (59.648 kg)  04/27/12 135 lb 6.9 oz (61.43 kg)  10/21/11 140 lb (63.504 kg)    Usual Body Weight: unknown  % Usual Body Weight: NA  BMI:  Body mass index is 22.53 kg/(m^2).  Estimated Nutritional Needs: Kcal:  1400-1600 Protein: 60-70 grams Fluid: 2.1 L  Skin: Hip incision  Diet Order: NPO  EDUCATION NEEDS: -No education needs identified at this time   Intake/Output Summary (Last 24 hours) at 01/08/13 1618 Last data filed at 01/08/13 1535  Gross per 24 hour  Intake   2220 ml  Output   1480 ml  Net    740 ml    Last BM: 7/31  Labs:   Recent Labs Lab 01/07/13 1755 01/08/13 0405  NA 143 141  K 4.3 4.6  CL 104 105  CO2 28 30  BUN 27* 25*  CREATININE 1.42* 1.46*  CALCIUM 9.6 9.1  GLUCOSE 95 96    CBG (last 3)  No results found for this basename: GLUCAP,  in the last 72 hours  Scheduled Meds: . [MAR HOLD] budesonide-formoterol  2 puff Inhalation BID  . [MAR HOLD] donepezil  5 mg Oral QHS  . [MAR HOLD] enoxaparin (LOVENOX) injection  30 mg Subcutaneous Q24H  . [MAR HOLD] escitalopram  5 mg Oral Daily  . [facility] HOLD] ferrous sulfate  325 mg Oral Q breakfast  . [MAR HOLD] folic acid  1 mg Oral Daily  . [facility] HOLD] QUEtiapine  25 mg Oral BID    Continuous Infusions: . sodium chloride 60 mL/hr at 01/08/13 1045    Past Medical History  Diagnosis Date  . Hypertension   . Memory loss   . Anemia   .  Chronic back pain   . Chronic kidney disease   . Anxiety   . Panic attack   . Chronic respiratory failure 04/30/2012  . COPD (chronic obstructive pulmonary disease)   . AAA (abdominal aortic aneurysm)   . Cancer     skin Left wrist BCC  . DVT (deep venous thrombosis)   . Aortic aneurysm, abdominal   . Back injury     has had broken back x 2  . Knee problem     Right knee pain  . Arm fracture, right     Past Surgical History  Procedure Laterality Date  . Hemorrhoid surgery      Ian Malkin RD, LDN Inpatient Clinical Dietitian Pager: [phone] After Hours Pager: [phone]

## 2013-01-09 ENCOUNTER — Encounter (HOSPITAL_COMMUNITY): Payer: Self-pay | Admitting: Orthopedic Surgery

## 2013-01-09 DIAGNOSIS — J9610 Chronic respiratory failure, unspecified whether with hypoxia or hypercapnia: Secondary | ICD-10-CM

## 2013-01-09 DIAGNOSIS — I959 Hypotension, unspecified: Secondary | ICD-10-CM

## 2013-01-09 DIAGNOSIS — W19XXXA Unspecified fall, initial encounter: Secondary | ICD-10-CM

## 2013-01-09 DIAGNOSIS — J438 Other emphysema: Secondary | ICD-10-CM

## 2013-01-09 LAB — CBC
HCT: 26.8 % — ABNORMAL LOW (ref 36.0–46.0)
Hemoglobin: 8.7 g/dL — ABNORMAL LOW (ref 12.0–15.0)
MCH: 30.2 pg (ref 26.0–34.0)
MCHC: 32.5 g/dL (ref 30.0–36.0)
MCV: 93.1 fL (ref 78.0–100.0)
RDW: 14 % (ref 11.5–15.5)

## 2013-01-09 LAB — BASIC METABOLIC PANEL
BUN: 25 mg/dL — ABNORMAL HIGH (ref 6–23)
CO2: 27 mEq/L (ref 19–32)
Chloride: 100 mEq/L (ref 96–112)
Creatinine, Ser: 1.54 mg/dL — ABNORMAL HIGH (ref 0.50–1.10)
Glucose, Bld: 107 mg/dL — ABNORMAL HIGH (ref 70–99)
Potassium: 4.6 mEq/L (ref 3.5–5.1)

## 2013-01-09 MED ORDER — SODIUM CHLORIDE 0.9 % IV SOLN: Freq: Once | INTRAVENOUS | Status: DC

## 2013-01-09 MED ORDER — SODIUM CHLORIDE 0.9 % IV SOLN
Freq: Once | INTRAVENOUS | Status: AC
  Administered 2013-01-09: 11:00:00 via INTRAVENOUS

## 2013-01-09 NOTE — Progress Notes (Signed)
Responded to rapid response call in room 1611. Pt bedside nurse at bedside. BP taken was 98/66, HR 69,RR 18 99% RA. Pt alert and answering questions appropriately. Bedside nurse on phone with Dr. Gwenlyn Perking receiving bolus orders and orders for blood. Continued to stay with the pt and monitor blood pressure. Pt bedside nurse administered bolus and blood. Told to call if further assistance needed. Geroge Baseman 01/09/2013

## 2013-01-09 NOTE — Progress Notes (Signed)
CSW assisting with d/c planning. Spoke to daughter this afternoon regarding [facility] placement. She is in agreement with MD that SNF placement will be needed at D/c. CSW has initiated SNF search and will request week end CSW to provide SNF bed offers to family. CSW will request authorization for SNF placement from [facility] on Monday. PT/ OT evals are pending. Therapy notes are needed for insurance auth.  Cori Razor LCSW [phone]

## 2013-01-09 NOTE — Progress Notes (Signed)
OT Cancellation Note  Patient Details Name: REMONIA OTTE MRN: [id number] DOB: [birth year]   Cancelled Treatment:    Reason Eval/Treat Not Completed: Other (comment) Pt not ready for OT today.  Will check back tomorrow.    Carmin Dibartolo 01/09/2013, 12:45 PM Marica Otter, OTR/L [phone] 01/09/2013

## 2013-01-09 NOTE — Progress Notes (Addendum)
Clinical Social Work Department CLINICAL SOCIAL WORK PLACEMENT NOTE 01/09/2013  Patient:  CATRENA, VARI  Account Number:  [id number] Admit date:  01/07/2013  Clinical Social Worker:  Cori Razor, LCSW  Date/time:  01/09/2013 04:32 PM  Clinical Social Work is seeking post-discharge placement for this patient at the following level of care:   SKILLED NURSING   (*CSW will update this form in Epic as items are completed)     Patient/family provided with [facility] Department of Clinical Social Work's list of facilities offering this level of care within the geographic area requested by the patient (or if unable, by the patient's family).  01/09/2013  Patient/family informed of their freedom to choose among providers that offer the needed level of care, that participate in Medicare, Medicaid or managed care program needed by the patient, have an available bed and are willing to accept the patient.  01/09/2013  Patient/family informed of [facility]' ownership interest in [facility], as well as of the fact that they are under no obligation to receive care at this facility.  PASARR submitted to EDS on 01/09/2013 PASARR number received from EDS on 01/09/2013  FL2 transmitted to all facilities in geographic area requested by pt/family on  01/09/2013 FL2 transmitted to all facilities within larger geographic area on   Patient informed that his/her managed care company has contracts with or will negotiate with  certain facilities, including the following:     Patient/family informed of bed offers received:  01/10/13 Patient chooses bed at  Physician recommends and patient chooses bed at    Patient to be transferred to  on   Patient to be transferred to facility by   The following physician request were entered in Epic:   Additional Comments:  Cori Razor LCSW [phone]

## 2013-01-09 NOTE — Progress Notes (Signed)
PATIENT ID: [name]   1 Day Post-Op Procedure(s) (LRB): ARTHROPLASTY BIPOLAR HIP (Right)  Subjective:no complaints.  Resting comfortably  Objective:  Filed Vitals:   01/09/13 0427  BP: 93/60  Pulse: 62  Temp: 99.7 F (37.6 C)  Resp: 18     Right hip dressing with small blood.  Mild swelling.  Distally she wiggles her toes and ankle up and down to command.  Labs:   Recent Labs  01/07/13 1755 01/08/13 0405 01/09/13 0359  HGB 11.5* 10.0* 8.7*   Recent Labs  01/08/13 0405 01/09/13 0359  WBC 7.5 6.3  RBC 3.35* 2.88*  HCT 31.3* 26.8*  PLT 119* 134*   Recent Labs  01/08/13 0405 01/09/13 0359  NA 141 134*  K 4.6 4.6  CL 105 100  CO2 30 27  BUN 25* 25*  CREATININE 1.46* 1.54*  GLUCOSE 96 107*  CALCIUM 9.1 8.5    Assessment and Plan:postoperative day #1 status post right hip hemiarthroplasty for fracture ABLA: expected, observed.  Recheck hemoglobin in AM Weightbearing as tolerated with posterior hip precautions Given her elevated creatinine, we will keep her Foley and an additional day.  VTE proph: Aspirin twice daily with SCDs

## 2013-01-09 NOTE — Progress Notes (Signed)
PT Cancellation Note  Patient Details Name: [name] MRN: [id number] DOB: 1926-09-20   Cancelled Treatment:     PT deferred - pt with rapid response called and transfusion in progress.  Will follow in am   [name] 01/09/2013, 4:35 PM

## 2013-01-09 NOTE — Care Management Note (Addendum)
    Page 1 of 1   01/12/2013     4:48:25 PM   CARE MANAGEMENT NOTE 01/12/2013  Patient:  [name], [name]   Account Number:  [id number]  Date Initiated:  01/09/2013  Documentation initiated by:  Colleen Can  Subjective/Objective Assessment:   dx rt hip displaced fracture; rt hemiarthroplasty     Action/Plan:   Pt/Ot pending.  Rapid response episode today. CM will follow   Anticipated DC Date:  01/11/2013   Anticipated DC Plan:  SKILLED NURSING FACILITY  In-house referral  Clinical Social Worker      DC Planning Services  CM consult      Choice offered to / List presented to:             Status of service:  Completed, signed off Medicare Important Message given?   (If response is "NO", the following Medicare IM given date fields will be blank) Date Medicare IM given:   Date Additional Medicare IM given:    Discharge Disposition:    Per UR Regulation:    If discussed at Long Length of Stay Meetings, dates discussed:    Comments:

## 2013-01-09 NOTE — Progress Notes (Signed)
Pt BP 68/40 per NT.  Manual BP 70's.  Pt placed in trendelenburg position.Pt somewhat lethargic but easy to respond.   Rapid response RN called.  IVF bolus initiated per Rapid response.  Dr Gwenlyn Perking notified, orders received and carried out.

## 2013-01-10 LAB — BASIC METABOLIC PANEL
Calcium: 8 mg/dL — ABNORMAL LOW (ref 8.4–10.5)
Chloride: 105 mEq/L (ref 96–112)
Creatinine, Ser: 1.41 mg/dL — ABNORMAL HIGH (ref 0.50–1.10)
GFR calc Af Amer: 38 mL/min — ABNORMAL LOW (ref >90)
Sodium: 137 mEq/L (ref 135–145)

## 2013-01-10 LAB — CBC
MCV: 91.3 fL (ref 78.0–100.0)
Platelets: DECREASED 10*3/uL (ref 150–400)
RBC: 2.53 MIL/uL — ABNORMAL LOW (ref 3.87–5.11)
RDW: 14 % (ref 11.5–15.5)
WBC: 5.9 10*3/uL (ref 4.0–10.5)

## 2013-01-10 MED ORDER — TRAMADOL HCL 50 MG PO TABS: 50.0000 mg | ORAL_TABLET | Freq: Four times a day (QID) | ORAL | Status: DC | PRN

## 2013-01-10 MED ORDER — FERROUS SULFATE 325 (65 FE) MG PO TABS
325.0000 mg | ORAL_TABLET | Freq: Two times a day (BID) | ORAL | Status: DC
  Administered 2013-01-11 – 2013-01-12 (×2): 325 mg via ORAL
  Filled 2013-01-10 (×6): qty 1

## 2013-01-10 NOTE — Progress Notes (Signed)
Message from Pt's daughter stating that she received CSW's message and that she will review the offers left for her in Pt's room.  Providence Crosby, LCSWA Clinical Social Work [phone]

## 2013-01-10 NOTE — Progress Notes (Signed)
T/c from Pt's daughter.  Pt's daughter reviewed the offers and asked CSW if a search in [place] could be conducted, as none of the facilities that offered a bed are close to Pt's home.  Reviewed [facility] with Pt's daughter.  Of the [employer], Pt's daughter asking that Pt's info be sent to [facility] and [facility], as they are close to Pt's home.  CSW sent Pt's information to [facility] and [facility].  Weekday CSW to follow.  CSW thanked Pt's daughter for her time.  Providence Crosby, LCSWA Clinical Social Work [phone]

## 2013-01-10 NOTE — Progress Notes (Signed)
[facility] PROGRESS NOTE  [name] GNF:[id number] DOB: [birth year] DOA: 01/07/2013 PCP: Hollice Espy, MD Primary pulmonologist: Dr. Marcelyn Bruins Vascular Surgery: Dr. Waverly Ferrari  Assessment/Plan: 1. Right displaced femoral neck/hip fracture: Orthopedic consultation appreciated- s/p right hip hemiarthroplasty. Will follow rec's for post surgical care, activity/weight bearing and anticoagulation 2. Acute renal failure on stage II chronic kidney disease: Creatinine 0.84 in November 2013. Now 1.54. Likely secondary to intravascular volume depletion, hypotension and ACE inhibitors. Continue holding nephrotoxic agents, BMET in am 3. Normocytic anemia, chronic & thrombocytopenia: Anemia worse after surgery. And now with hypotensive episode. Will transfuse 1 unit of PRBC 4. Hypertension: now hypotensive.       -stop antihypertensive agents       -fluid resuscitation and transfusion of 1 unit PRBC 5. Chronic respiratory failure on home O2, COPD, kyphosis, large hiatal hernia: Stable. Continue oxygen. Aggressive incentive spirometry and mobilize ASAP. Continue home respiratory medications. 6. Memory impairment/possible dementia. Patient pleasantly confused. Family reports is not new. Will follow response and avoid heavy pain meds. 7. Systolic murmur: according to family is new. Will check 2-D echo  Code Status: Full Family Communication: None Disposition Plan: Probably SNF when medically stable.   Consultants:  Orthopedics  Procedures:  None  Antibiotics:  None   HPI/Subjective: Pleasantly confused. Denies complaints. No SOB. BP soft and drop of aprox 3 grams on Hgb  Objective: Filed Vitals:   01/09/13 1600 01/09/13 1800 01/09/13 2040 01/09/13 2219  BP:  110/52 103/64 102/62  Pulse:  56 67   Temp:  98.6 F (37 C) 98.5 F (36.9 C)   TempSrc:  Axillary Oral   Resp: 16 14 16    Height:      Weight:      SpO2: 94% 94% 100%     Intake/Output Summary (Last 24  hours) at 01/10/13 0005 Last data filed at 01/09/13 1831  Gross per 24 hour  Intake 6595.83 ml  Output   1001 ml  Net 5594.83 ml   Filed Weights   01/07/13 2100  Weight: 63.3 kg (139 lb 8.8 oz)    Exam:   General exam: Comfortable. Frail elderly female lying comfortably supine in bed.  Respiratory system: Clear. No increased work of breathing.  Cardiovascular system: S1 & S2 heard, RRR. No JVD, gallops, clicks or pedal edema. Grade 2/6 systolic murmur  Gastrointestinal system: Abdomen is nondistended, soft and nontender. Normal bowel sounds heard.  Central nervous system: Alert and oriented to self and partly to place. No focal neurological deficits.  Extremities: no edema, reporting pain with movement only; dressing clear and intact   Data Reviewed: Basic Metabolic Panel:  Recent Labs Lab 01/07/13 1755 01/08/13 0405 01/09/13 0359  NA 143 141 134*  K 4.3 4.6 4.6  CL 104 105 100  CO2 28 30 27   GLUCOSE 95 96 107*  BUN 27* 25* 25*  CREATININE 1.42* 1.46* 1.54*  CALCIUM 9.6 9.1 8.5   CBC:  Recent Labs Lab 01/07/13 1755 01/08/13 0405 01/09/13 0359  WBC 10.6* 7.5 6.3  HGB 11.5* 10.0* 8.7*  HCT 36.0 31.3* 26.8*  MCV 93.8 93.4 93.1  PLT 132* 119* 134*   CBG: No results found for this basename: GLUCAP,  in the last 168 hours  Recent Results (from the past 240 hour(s))  SURGICAL PCR SCREEN     Status: None   Collection Time    01/08/13  9:57 AM      Result Value Range Status   MRSA, PCR  NEGATIVE  NEGATIVE Final   Staphylococcus aureus NEGATIVE  NEGATIVE Final   Comment:            The Xpert SA Assay (FDA     approved for NASAL specimens     in patients over 34 years of age),     is one component of     a comprehensive surveillance     program.  Test performance has     been validated by [employer] for patients greater     than or equal to 32 year old.     It is not intended     to diagnose infection nor to     guide or monitor treatment.      Studies: Dg Pelvis Portable  01/08/2013   *RADIOLOGY REPORT*  Clinical Data: Postop right hip replacement surgery.  PORTABLE PELVIS  Comparison: 01/07/2013.  Findings: There is a cemented bipolar type right hip prosthesis in good position.  No complicating features.  No pelvic fractures.  IMPRESSION: Well seated right hip prosthesis without complicating features.   Original Report Authenticated By: Rudie Meyer, M.D.   EKG 01/07/13: Tremor artifacts. Sinus rhythm with PVCs and no acute changes. Followup EKG 7/31: Sinus bradycardia at 55 beats per minute with first degree AV block without acute changes.  Additional labs:   Scheduled Meds: . aspirin EC  325 mg Oral BID  . budesonide-formoterol  2 puff Inhalation BID  . donepezil  5 mg Oral QHS  . enoxaparin (LOVENOX) injection  30 mg Subcutaneous Q24H  . escitalopram  5 mg Oral Daily  . feeding supplement  237 mL Oral BID BM  . ferrous sulfate  325 mg Oral Q breakfast  . folic acid  1 mg Oral Daily  . multivitamin with minerals  1 tablet Oral Daily  . QUEtiapine  25 mg Oral BID   Continuous Infusions:   Principal Problem:   Hip fracture, right Active Problems:   Fall   Benign hypertension   COPD (chronic obstructive pulmonary disease) with emphysema   Leukocytosis, unspecified   ARF (acute renal failure)   Anemia of chronic disease    Time spent: 30 minutes    Daven Montz  [facility] Pager [phone]  If 8PM-8AM, please contact night-coverage at [email or web address], password [facility] 01/10/2013, 12:05 AM  LOS: 3 days

## 2013-01-10 NOTE — Evaluation (Signed)
Occupational Therapy Evaluation Patient Details Name: [name] MRN: [id number] DOB: [birth year] Today's Date: 01/10/2013 Time: 4098-1191 OT Time Calculation (min): 29 min  OT Assessment / Plan / Recommendation History of present illness s/p fall resulting in right hip fx; s/p  right hip posterior hemiarthroplasty   Clinical Impression   Pt is s/p THA resulting in the deficits listed below (see OT Problem List). Pt very distractable with difficulty maintaining attention on task.  Pt is currently, mod A - total A for all aspects of BADLs.  Pt will benefit from skilled OT to increase their safety and independence with ADL and functional mobility for ADL to facilitate discharge to venue listed below.        OT Assessment  Patient needs continued OT Services    Follow Up Recommendations  SNF;Supervision/Assistance - 24 hour    Barriers to Discharge Decreased caregiver support Family unable to provide necessary level of care  Equipment Recommendations  None recommended by OT    Recommendations for Other Services    Frequency  Min 2X/week    Precautions / Restrictions Precautions Precautions: Fall;Posterior Hip Required Braces or Orthoses: Knee Immobilizer - Right Restrictions RLE Weight Bearing: Weight bearing as tolerated   Pertinent Vitals/Pain     ADL  Eating/Feeding: Moderate assistance Where Assessed - Eating/Feeding: Chair Grooming: Wash/dry hands;Wash/dry face;Teeth care;Denture care;Moderate assistance Where Assessed - Grooming: Supported sitting Upper Body Bathing: Maximal assistance Where Assessed - Upper Body Bathing: Supported sitting Lower Body Bathing: +1 Total assistance Where Assessed - Lower Body Bathing: Supported sit to stand Upper Body Dressing: Maximal assistance Where Assessed - Upper Body Dressing: Unsupported sitting Lower Body Dressing: +1 Total assistance Where Assessed - Lower Body Dressing: Supported sit to Pharmacist, hospital: +2 Total  assistance Toilet Transfer: Patient Percentage: 30% Statistician Method: Sit to stand;Stand pivot Acupuncturist: Bedside commode Toileting - Clothing Manipulation and Hygiene: +1 Total assistance Where Assessed - Engineer, mining and Hygiene: Standing Equipment Used: Rolling walker Transfers/Ambulation Related to ADLs: max A for sit to stand ADL Comments: Pt able to recognize a toothbrush and demonstrate use;  Pt very distractable and unable to complete tasks (Pt was instructed in THA precautions)    OT Diagnosis: Generalized weakness;Cognitive deficits  OT Problem List: Decreased strength;Decreased activity tolerance;Impaired balance (sitting and/or standing);Decreased cognition;Decreased safety awareness;Decreased knowledge of use of DME or AE;Decreased knowledge of precautions OT Treatment Interventions: Self-care/ADL training;DME and/or AE instruction;Therapeutic activities;Patient/family education   OT Goals(Current goals can be found in the care plan section) Acute Rehab OT Goals Patient Stated Goal: pt unable  OT Goal Formulation: With family Time For Goal Achievement: 01/17/13 Potential to Achieve Goals: Fair ADL Goals Pt Will Perform Grooming: with min assist;sitting Pt Will Transfer to Toilet: with mod assist;bedside commode;stand pivot transfer  Visit Information  Last OT Received On: 01/10/13 Assistance Needed: +2 History of Present Illness: s/p fall resulting in right hip fx; s/p  right hip posterior hemiarthroplasty       Prior Functioning     Home Living Family/patient expects to be discharged to:: Skilled nursing facility Additional Comments: unable to get info due to pt cogniotive status and no family present Prior Function Level of Independence: Needs assistance Gait / Transfers Assistance Needed: dtr reports pt was unsteady and required min guard assist to min A ADL's / Homemaking Assistance Needed: Pt was able to assist with  bathing and dressing (required min - mod A);  set up for self feeding; min A  for toilet transfers;   min A for grooming  Communication Communication:  (speech tangential and makes very little sense)         Vision/Perception     Cognition  Cognition Arousal/Alertness: Lethargic Behavior During Therapy: WFL for tasks assessed/performed Overall Cognitive Status: Impaired/Different from baseline Area of Impairment: Orientation;Attention;Memory;Following commands;Safety/judgement;Problem solving;Awareness Orientation Level: Disoriented to;Place;Time;Situation Current Attention Level: Sustained Memory: Decreased short-term memory;Decreased recall of precautions Following Commands: Follows one step commands consistently Safety/Judgement: Decreased awareness of safety;Decreased awareness of deficits Problem Solving: Slow processing;Decreased initiation;Difficulty sequencing;Requires verbal cues;Requires tactile cues General Comments: Pt very confused.  Dtr reports signficant change from baseline    Extremity/Trunk Assessment Upper Extremity Assessment Upper Extremity Assessment: Overall WFL for tasks assessed Lower Extremity Assessment Lower Extremity Assessment: Generalized weakness;RLE deficits/detail RLE Deficits / Details: pt assists with AAROM knee and hip flexion  to 60 degrees RLE: Unable to fully assess due to pain     Mobility Bed Mobility Bed Mobility: Not assessed Supine to Sit: 1: +2 Total assist Supine to Sit: Patient Percentage: 10% Details for Bed Mobility Assistance: +2 for trunk and LEs and constant cues to self assist at all Transfers Transfers: Sit to Stand;Stand to Sit Sit to Stand: 2: Max assist Sit to Stand: Patient Percentage: 30% Stand to Sit: 2: Max assist Stand to Sit: Patient Percentage: 20% Details for Transfer Assistance: Pt requires verbal and tactile cues for technique, problem solving and attention.  Pt with posterior bias     Exercise      Balance Static Sitting Balance Static Sitting - Balance Support: Bilateral upper extremity supported Static Sitting - Level of Assistance: 4: Min assist;3: Mod assist;2: Max Oncologist Standing - Balance Support: Bilateral upper extremity supported Static Standing - Level of Assistance: 1: +2 Total assist   End of Session OT - End of Session Equipment Utilized During Treatment: Rolling walker Activity Tolerance: Patient limited by fatigue Patient left: in chair;with call bell/phone within reach;with family/visitor present Nurse Communication: Mobility status (Pt fall risk.  Dtr is present with pt currently )  GO     [name], [name] 01/10/2013, 3:22 PM

## 2013-01-10 NOTE — Progress Notes (Signed)
PATIENT ID: [name]   2 Days Post-Op Procedure(s) (LRB): ARTHROPLASTY BIPOLAR HIP (Right)  Subjective: transfused one unit yesterday for hypotension.   feels great today.  wants to go home.  No pain at all in her hip.  Objective:  Filed Vitals:   01/10/13 0500  BP: 112/72  Pulse: 68  Temp: 98.5 F (36.9 C)  Resp: 16     Right hip dressing changed.  Small serosanguineous drainage on the dressing.  The incision benign.  Distally grossly neurovascularly intact.  No calf tenderness or swelling.  Labs:   Recent Labs  01/07/13 1755 01/08/13 0405 01/09/13 0359 01/10/13 0453  HGB 11.5* 10.0* 8.7* 7.8*   Recent Labs  01/09/13 0359 01/10/13 0453  WBC 6.3 5.9  RBC 2.88* 2.53*  HCT 26.8* 23.1*  PLT 134* PLATELET CLUMPS NOTED ON SMEAR, COUNT APPEARS DECREASED   Recent Labs  01/09/13 0359 01/10/13 0453  NA 134* 137  K 4.6 4.0  CL 100 105  CO2 27 27  BUN 25* 25*  CREATININE 1.54* 1.41*  GLUCOSE 107* 93  CALCIUM 8.5 8.0*    Assessment and Plan:postoperative day #2 status post right hip hemiarthroplasty for fracture Weightbearing as tolerated with posterior hip precautions. Transition to a skilled nursing facility when medically stable. Acute blood loss anemia: Transfused 1 unit yesterday.  Asymptomatic today. Observe, unless primary medical team feels she needs further transfusion.  VTE proph: enteric-coated aspirin twice daily and SCDs.

## 2013-01-10 NOTE — Progress Notes (Signed)
[facility] PROGRESS NOTE  [name] WUJ:[id number] DOB: Nov 07, 1926 DOA: 01/07/2013 PCP: Jillian Espy, MD Primary pulmonologist: [name] Vascular Surgery: [name]  Assessment/Plan: 1. Right displaced femoral neck/hip fracture: Orthopedic consultation appreciated- s/p right hip hemiarthroplasty. Will follow rec's for post surgical care, activity/weight bearing and anticoagulation. Per PT will need SNf for rehab. 2. Acute renal failure on stage II chronic kidney disease: Creatinine 0.84 in November 2013. Trending down; today 1.4. Likely secondary to intravascular volume depletion, hypotension and ACE inhibitors. Continue holding nephrotoxic agents, BMET in am 3. Normocytic anemia, chronic & thrombocytopenia: Anemia worse after surgery. S/p 1 unit PRBc on 8/1; Hgb 7.8, VSS and patient feeling betetr. Will continue ferrous sulfate; follow Hgb trend in and if still low will transfuse 1 more unit. Concern of some hemodilution after blood given 4. Hypertension: now hypotensive.       -continue holding antihypertensive agents       -s/p in unit of PRBC transfused and also fluid resuscitation (1.5 liters). Will              follow VS. Patient feeling better today and is more interactive 5. Chronic respiratory failure on home O2, COPD, kyphosis, large hiatal hernia: Stable. Continue oxygen. Aggressive incentive spirometry/flutter valve and mobilize ASAP. Continue home respiratory medications. 6. Memory impairment/possible dementia. Patient pleasantly confused. Family reports is not new. Will follow response and avoid heavy pain meds. Tylenol and tramadol for pain 7. Systolic murmur: according to family is new. Will check 2-D echo  Code Status: Full Family Communication: None Disposition Plan: Probably SNF when medically stable.   Consultants:  Orthopedics  Procedures:  None  Antibiotics:  None   HPI/Subjective: Pleasantly confused. Denies complaints. No  SOB. BP soft but stable after PRBC transfused yesterday. No fever  Objective: Filed Vitals:   01/10/13 0800 01/10/13 1015 01/10/13 1200 01/10/13 1323  BP:  106/67  95/59  Pulse:  101  105  Temp:  96.1 F (35.6 C)  98.1 F (36.7 C)  TempSrc:  Oral  Oral  Resp: 18 18 18 18   Height:      Weight:      SpO2: 95% 97% 96% 97%    Intake/Output Summary (Last 24 hours) at 01/10/13 1612 Last data filed at 01/10/13 1018  Gross per 24 hour  Intake   1290 ml  Output   2025 ml  Net   -735 ml   Filed Weights   01/07/13 2100  Weight: 63.3 kg (139 lb 8.8 oz)    Exam:   General exam: Comfortable. Frail elderly female sitting on chair eating lunch  Respiratory system: Clear. No increased work of breathing.  Cardiovascular system: S1 & S2 heard, RRR. No JVD, gallops, clicks or pedal edema. Grade 2/6 systolic murmur  Gastrointestinal system: Abdomen is nondistended, soft and nontender. Normal bowel sounds heard.  Central nervous system: Alert and oriented to self and partly to place. No focal neurological deficits.  Extremities: no edema, reporting pain with movement only; dressing clear and intact   Data Reviewed: Basic Metabolic Panel:  Recent Labs Lab 01/07/13 1755 01/08/13 0405 01/09/13 0359 01/10/13 0453  NA 143 141 134* 137  K 4.3 4.6 4.6 4.0  CL 104 105 100 105  CO2 28 30 27 27   GLUCOSE 95 96 107* 93  BUN 27* 25* 25* 25*  CREATININE 1.42* 1.46* 1.54* 1.41*  CALCIUM 9.6 9.1 8.5 8.0*   CBC:  Recent Labs Lab 01/07/13 1755 01/08/13 0405 01/09/13  0359 01/10/13 0453  WBC 10.6* 7.5 6.3 5.9  HGB 11.5* 10.0* 8.7* 7.8*  HCT 36.0 31.3* 26.8* 23.1*  MCV 93.8 93.4 93.1 91.3  PLT 132* 119* 134* PLATELET CLUMPS NOTED ON SMEAR, COUNT APPEARS DECREASED   CBG:  Recent Results (from the past 240 hour(s))  SURGICAL PCR SCREEN     Status: None   Collection Time    01/08/13  9:57 AM      Result Value Range Status   MRSA, PCR NEGATIVE  NEGATIVE Final   Staphylococcus  aureus NEGATIVE  NEGATIVE Final   Comment:            The Xpert SA Assay (FDA     approved for NASAL specimens     in patients over 81 years of age),     is one component of     a comprehensive surveillance     program.  Test performance has     been validated by [employer] for patients greater     than or equal to 50 year old.     It is not intended     to diagnose infection nor to     guide or monitor treatment.     Studies: No results found. EKG 01/07/13: Tremor artifacts. Sinus rhythm with PVCs and no acute changes. Followup EKG 7/31: Sinus bradycardia at 55 beats per minute with first degree AV block without acute changes.  Additional labs:   Scheduled Meds: . aspirin EC  325 mg Oral BID  . budesonide-formoterol  2 puff Inhalation BID  . donepezil  5 mg Oral QHS  . escitalopram  5 mg Oral Daily  . feeding supplement  237 mL Oral BID BM  . ferrous sulfate  325 mg Oral BID WC  . folic acid  1 mg Oral Daily  . multivitamin with minerals  1 tablet Oral Daily  . QUEtiapine  25 mg Oral BID   Continuous Infusions:   Principal Problem:   Hip fracture, right Active Problems:   Fall   Benign hypertension   COPD (chronic obstructive pulmonary disease) with emphysema   Leukocytosis, unspecified   ARF (acute renal failure)   Anemia of chronic disease    Time spent: 30 minutes    [name]  [facility] Pager [phone]  If 8PM-8AM, please contact night-coverage at [email or web address], password [facility] 01/10/2013, 4:12 PM  LOS: 3 days

## 2013-01-10 NOTE — Progress Notes (Signed)
Provided Pt's daughter with the documentation stating that Pt's information had been sent to [facility] and [facility].  Pt's daughter stated that she feels that these 2 facilities will probably be their first choice and that the family will keep their fingers crossed for either one of them.  Weekday CSW to follow.  Providence Crosby, LCSWA Clinical Social Work [phone]

## 2013-01-10 NOTE — Progress Notes (Addendum)
Bed offers received.  LM for Pt's daughter, Mrs. Erroll Luna, stating that bed offers have been received and that CSW will leave this list in Pt's room.  Left my phone number for Mrs. Bettini to call back with any questions.  Providence Crosby, LCSWA Clinical Social Work [phone]

## 2013-01-10 NOTE — Evaluation (Signed)
Physical Therapy Evaluation Patient Details Name: [name] MRN: [id number] DOB: [birth year] Today's Date: 01/10/2013 Time: 1221-1237 PT Time Calculation (min): 16 min  PT Assessment / Plan / Recommendation History of Present Illness  s/p fall resulting in right hip fx; s/p  right hip posterior hemiarthroplasty  Clinical Impression  Pt follows most functional commands, alert,  Disoriented (dementia-baseline) and keeps calling for her daughter, Eunice Blase.  She will likely need STSNF, however no family present a time of eval to determine prior living arrangements/baseline mobility.    PT Assessment  Patient needs continued PT services    Follow Up Recommendations  SNF;Supervision/Assistance - 24 hour    Does the patient have the potential to tolerate intense rehabilitation      Barriers to Discharge        Equipment Recommendations       Recommendations for Other Services     Frequency Min 3X/week    Precautions / Restrictions Precautions Precautions: Fall;Posterior Hip Required Braces or Orthoses: Knee Immobilizer - Right Restrictions Weight Bearing Restrictions: No RLE Weight Bearing: Weight bearing as tolerated   Pertinent Vitals/Pain No c/o pain      Mobility  Bed Mobility Bed Mobility: Supine to Sit Supine to Sit: 1: +2 Total assist Supine to Sit: Patient Percentage: 10% Details for Bed Mobility Assistance: +2 for trunk and LEs and constant cues to self assist at all Transfers Transfers: Sit to Stand;Stand to Sit;Stand Pivot Transfers Sit to Stand: 1: +2 Total assist Sit to Stand: Patient Percentage: 30% Stand to Sit: 1: +2 Total assist Stand to Sit: Patient Percentage: 20% Stand Pivot Transfers: 1: +2 Total assist Stand Pivot Transfers: Patient Percentage: 30% Details for Transfer Assistance: repeated X 2 due pt sitting immediately after first standing attempt; +2 for wt shift, safety, balance and lines    Exercises     PT Diagnosis: Generalized  weakness;Difficulty walking  PT Problem List: Decreased strength;Decreased activity tolerance;Decreased balance;Decreased mobility;Decreased safety awareness;Decreased knowledge of use of DME;Decreased knowledge of precautions;Decreased cognition PT Treatment Interventions: DME instruction;Gait training;Functional mobility training;Therapeutic activities;Patient/family education;Balance training;Therapeutic exercise     PT Goals(Current goals can be found in the care plan section) Acute Rehab PT Goals PT Goal Formulation: Patient unable to participate in goal setting Time For Goal Achievement: 01/24/13 Potential to Achieve Goals: Fair  Visit Information  Last PT Received On: 01/10/13 Assistance Needed: +2 History of Present Illness: s/p fall resulting in right hip fx; s/p  right hip posterior hemiarthroplasty       Prior Functioning  Home Living Family/patient expects to be discharged to:: Unsure (??) Additional Comments: unable to get info due to pt cogniotive status and no family present Communication Communication: No difficulties    Cognition  Cognition Arousal/Alertness: Awake/alert Behavior During Therapy: Restless Overall Cognitive Status: History of cognitive impairments - at baseline    Extremity/Trunk Assessment Upper Extremity Assessment Upper Extremity Assessment: Generalized weakness Lower Extremity Assessment Lower Extremity Assessment: Generalized weakness;RLE deficits/detail RLE Deficits / Details: pt assists with AAROM knee and hip flexion  to 60 degrees RLE: Unable to fully assess due to pain   Balance Static Sitting Balance Static Sitting - Balance Support: Bilateral upper extremity supported Static Sitting - Level of Assistance: 4: Min assist;3: Mod assist;2: Max Oncologist Standing - Balance Support: Bilateral upper extremity supported Static Standing - Level of Assistance: 1: +2 Total assist  End of Session PT - End of  Session Equipment Utilized During Treatment: Gait belt Activity Tolerance:  Patient limited by fatigue;Patient limited by pain;Other (comment) (cognitive status) Patient left: in chair;with call bell/phone within reach Nurse Communication: Mobility status  GP     [facility] 01/10/2013, 1:19 PM

## 2013-01-10 NOTE — Progress Notes (Signed)
Patient is having difficulity taking her MDI due to her status. Just wanted MD to be aware.

## 2013-01-11 DIAGNOSIS — D62 Acute posthemorrhagic anemia: Secondary | ICD-10-CM

## 2013-01-11 DIAGNOSIS — I517 Cardiomegaly: Secondary | ICD-10-CM

## 2013-01-11 LAB — CBC
HCT: 21 % — ABNORMAL LOW (ref 36.0–46.0)
Platelets: 121 10*3/uL — ABNORMAL LOW (ref 150–400)
RBC: 2.31 MIL/uL — ABNORMAL LOW (ref 3.87–5.11)
RDW: 13.9 % (ref 11.5–15.5)
WBC: 6.5 10*3/uL (ref 4.0–10.5)

## 2013-01-11 LAB — BASIC METABOLIC PANEL
CO2: 26 mEq/L (ref 19–32)
Chloride: 104 mEq/L (ref 96–112)
GFR calc Af Amer: 46 mL/min — ABNORMAL LOW (ref >90)
Potassium: 4.1 mEq/L (ref 3.5–5.1)

## 2013-01-11 LAB — PREPARE RBC (CROSSMATCH)

## 2013-01-11 MED ORDER — TRAMADOL HCL 50 MG PO TABS: 50.0000 mg | ORAL_TABLET | Freq: Four times a day (QID) | ORAL | Status: DC | PRN

## 2013-01-11 MED ORDER — FUROSEMIDE 10 MG/ML IJ SOLN
20.0000 mg | Freq: Once | INTRAMUSCULAR | Status: AC
  Administered 2013-01-11: 20 mg via INTRAVENOUS
  Filled 2013-01-11: qty 2

## 2013-01-11 MED ORDER — BUDESONIDE 0.25 MG/2ML IN SUSP
0.2500 mg | Freq: Two times a day (BID) | RESPIRATORY_TRACT | Status: DC
  Administered 2013-01-11 – 2013-01-12 (×2): 0.25 mg via RESPIRATORY_TRACT
  Filled 2013-01-11 (×5): qty 2

## 2013-01-11 MED ORDER — ASPIRIN 325 MG PO TBEC: 325.0000 mg | DELAYED_RELEASE_TABLET | Freq: Two times a day (BID) | ORAL | Status: DC

## 2013-01-11 NOTE — Progress Notes (Signed)
PATIENT ID: [name]   3 Days Post-Op Procedure(s) (LRB): ARTHROPLASTY BIPOLAR HIP (Right)  Subjective: No new complaints, resting comfortably.  Objective:  Filed Vitals:   01/11/13 0800  BP:   Pulse:   Temp:   Resp: 18     R hip dressing C/D/I, no calf TTP or swelling  Labs:   Recent Labs  01/09/13 0359 01/10/13 0453 01/11/13 0630  HGB 8.7* 7.8* 7.0*   Recent Labs  01/10/13 0453 01/11/13 0630  WBC 5.9 6.5  RBC 2.53* 2.31*  HCT 23.1* 21.0*  PLT PLATELET CLUMPS NOTED ON SMEAR, COUNT APPEARS DECREASED 121*   Recent Labs  01/10/13 0453 01/11/13 0630  NA 137 136  K 4.0 4.1  CL 105 104  CO2 27 26  BUN 25* 23  CREATININE 1.41* 1.21*  GLUCOSE 93 110*  CALCIUM 8.0* 8.2*    Assessment and Plan: POD#3 R hip hemi WBAT with post hip precautions. ABLA: drifted down further to 7.0, will transfuse one additional unit. Recheck in am.  VTE proph: ECASA PO BID and SCDs.

## 2013-01-11 NOTE — Progress Notes (Signed)
*  PRELIMINARY RESULTS* Echocardiogram 2D Echocardiogram has been performed.  [name] M 01/11/2013, 10:28 AM

## 2013-01-11 NOTE — Progress Notes (Signed)
01/11/13 1700  PT Visit Information  Last PT Received On 01/11/13  Reason Eval/Treat Not Completed Medical issues which prohibited therapy;Fatigue/lethargy limiting ability to participate; Hgb 7.0, for transfusion today and lethargic

## 2013-01-11 NOTE — Progress Notes (Signed)
[facility] PROGRESS NOTE  [name] AVW:[id number] DOB: 23-May-1927 DOA: 01/07/2013 PCP: Hollice Espy, MD Primary pulmonologist: Dr. Marcelyn Bruins Vascular Surgery: Dr. Waverly Ferrari  Assessment/Plan: 1. Right displaced femoral neck/hip fracture: Orthopedic consultation appreciated- s/p right hip hemiarthroplasty. Will follow rec's for post surgical care, activity/weight bearing and anticoagulation. Per PT will need SNF for rehab. 2. Acute renal failure on stage II chronic kidney disease: Creatinine 0.84 in November 2013. Trending down; today 1.21. Likely secondary to intravascular volume depletion, hypotension and ACE inhibitors. Continue holding nephrotoxic agents, BMET in am 3. Normocytic anemia, chronic & thrombocytopenia: Anemia worse after surgery. S/p 1 unit PRBc on 8/1; Hgb 7.0 today; BP remains stable; but patient is pale and lethargic. Will transfuse 2 units of PRBC's 4. Hypertension: now hypotensive.       -continue holding antihypertensive agents       -s/p one unit of PRBC transfused and also fluid         resuscitation.       -BP is better and now stable. 5. Chronic respiratory failure on home O2, COPD, kyphosis, large hiatal hernia: Stable. Continue oxygen. Aggressive incentive spirometry/flutter valve and mobilize ASAP. Continue budenoside but change to nebulizer tx, patient having difficulty with MDI 6. Memory impairment/possible dementia. Patient pleasantly confused. Family reports is not new. Will follow response and avoid heavy pain meds. Tylenol and tramadol for pain 7. Systolic murmur: according to family is new. Secondary to tricuspid regurgitation per 2-D echo reports. No other significant structural abnormalities   Code Status: Full Family Communication: None Disposition Plan: Probably SNF when medically stable.   Consultants:  Orthopedics  Procedures:  None  Antibiotics:  None   HPI/Subjective: Lethargic; decreased interaction. Hgb down  to 7.0. Denies  SOB. BP improved and stable. No fever  Objective: Filed Vitals:   01/11/13 2000 01/11/13 2026 01/11/13 2106 01/11/13 2125  BP:  107/67 125/79 120/67  Pulse:  63 65 69  Temp:  99.5 F (37.5 C) 100 F (37.8 C) 99.6 F (37.6 C)  TempSrc:  Axillary Axillary Axillary  Resp: 20 20 20 18   Height:      Weight:      SpO2: 93% 96% 100% 99%    Intake/Output Summary (Last 24 hours) at 01/11/13 2234 Last data filed at 01/11/13 2026  Gross per 24 hour  Intake 1851.67 ml  Output      0 ml  Net 1851.67 ml   Filed Weights   01/07/13 2100  Weight: 63.3 kg (139 lb 8.8 oz)    Exam:   General exam: Comfortable. Lethargic today and with decrease interaction  Respiratory system: Clear. No increased work of breathing.  Cardiovascular system: S1 & S2 heard, RRR. No JVD, gallops, clicks or pedal edema. Grade 2/6 systolic murmur  Gastrointestinal system: Abdomen is nondistended, soft and nontender. Normal bowel sounds heard.  Central nervous system: Alert and oriented to self and partly to place. No focal neurological deficits.  Extremities: no edema, reporting pain with movement only; dressing clear and intact   Data Reviewed: Basic Metabolic Panel:  Recent Labs Lab 01/07/13 1755 01/08/13 0405 01/09/13 0359 01/10/13 0453 01/11/13 0630  NA 143 141 134* 137 136  K 4.3 4.6 4.6 4.0 4.1  CL 104 105 100 105 104  CO2 28 30 27 27 26   GLUCOSE 95 96 107* 93 110*  BUN 27* 25* 25* 25* 23  CREATININE 1.42* 1.46* 1.54* 1.41* 1.21*  CALCIUM 9.6 9.1 8.5 8.0* 8.2*   CBC:  Recent Labs Lab 01/07/13 1755 01/08/13 0405 01/09/13 0359 01/10/13 0453 01/11/13 0630  WBC 10.6* 7.5 6.3 5.9 6.5  HGB 11.5* 10.0* 8.7* 7.8* 7.0*  HCT 36.0 31.3* 26.8* 23.1* 21.0*  MCV 93.8 93.4 93.1 91.3 90.9  PLT 132* 119* 134* PLATELET CLUMPS NOTED ON SMEAR, COUNT APPEARS DECREASED 121*   CBG:  Recent Results (from the past 240 hour(s))  SURGICAL PCR SCREEN     Status: None   Collection  Time    01/08/13  9:57 AM      Result Value Range Status   MRSA, PCR NEGATIVE  NEGATIVE Final   Staphylococcus aureus NEGATIVE  NEGATIVE Final   Comment:            The Xpert SA Assay (FDA     approved for NASAL specimens     in patients over 77 years of age),     is one component of     a comprehensive surveillance     program.  Test performance has     been validated by [employer] for patients greater     than or equal to 65 year old.     It is not intended     to diagnose infection nor to     guide or monitor treatment.     Studies: No results found. EKG 01/07/13: Tremor artifacts. Sinus rhythm with PVCs and no acute changes. Followup EKG 7/31: Sinus bradycardia at 55 beats per minute with first degree AV block without acute changes.  Additional labs:   Scheduled Meds: . aspirin EC  325 mg Oral BID  . budesonide (PULMICORT) nebulizer solution  0.25 mg Nebulization BID  . donepezil  5 mg Oral QHS  . escitalopram  5 mg Oral Daily  . feeding supplement  237 mL Oral BID BM  . ferrous sulfate  325 mg Oral BID WC  . folic acid  1 mg Oral Daily  . multivitamin with minerals  1 tablet Oral Daily  . QUEtiapine  25 mg Oral BID   Continuous Infusions:   Principal Problem:   Hip fracture, right Active Problems:   Fall   Benign hypertension   COPD (chronic obstructive pulmonary disease) with emphysema   Leukocytosis, unspecified   ARF (acute renal failure)   Anemia of chronic disease    Time spent: 30 minutes    [name]  [facility] Pager [phone]  If 8PM-8AM, please contact night-coverage at [email or web address], password [facility] 01/11/2013, 10:34 PM  LOS: 4 days

## 2013-01-12 ENCOUNTER — Inpatient Hospital Stay
Admission: RE | Admit: 2013-01-12 | Discharge: 2013-07-07 | Disposition: A | Discharge status: Other Acute Inpatient Hospital | Payer: Medicare Other | Source: Ambulatory Visit | Attending: Internal Medicine | Admitting: Internal Medicine

## 2013-01-12 DIAGNOSIS — J189 Pneumonia, unspecified organism: Principal | ICD-10-CM

## 2013-01-12 DIAGNOSIS — M25551 Pain in right hip: Secondary | ICD-10-CM

## 2013-01-12 DIAGNOSIS — R05 Cough: Secondary | ICD-10-CM

## 2013-01-12 DIAGNOSIS — I10 Essential (primary) hypertension: Secondary | ICD-10-CM

## 2013-01-12 LAB — TYPE AND SCREEN
ABO/RH(D): O POS
Antibody Screen: NEGATIVE
Unit division: 0

## 2013-01-12 LAB — CBC
Hemoglobin: 9.5 g/dL — ABNORMAL LOW (ref 12.0–15.0)
RBC: 3.17 MIL/uL — ABNORMAL LOW (ref 3.87–5.11)

## 2013-01-12 LAB — BASIC METABOLIC PANEL
GFR calc Af Amer: 44 mL/min — ABNORMAL LOW (ref >90)
GFR calc non Af Amer: 38 mL/min — ABNORMAL LOW (ref >90)
Potassium: 4.3 mEq/L (ref 3.5–5.1)
Sodium: 138 mEq/L (ref 135–145)

## 2013-01-12 MED ORDER — BUDESONIDE 0.25 MG/2ML IN SUSP: 0.2500 mg | Freq: Two times a day (BID) | RESPIRATORY_TRACT | Status: AC

## 2013-01-12 MED ORDER — DIAZEPAM 5 MG PO TABS: 2.5000 mg | ORAL_TABLET | Freq: Every evening | ORAL | Status: DC | PRN

## 2013-01-12 MED ORDER — ENSURE COMPLETE PO LIQD: 237.0000 mL | Freq: Two times a day (BID) | ORAL | Status: AC

## 2013-01-12 NOTE — Progress Notes (Signed)
CSW assisting with d/c planning. [employer] medicare has been contacted and SNF auth requested. Decision is pending. [facility] contacted at family's requested. SNF will call CSW with decision this am. CSW will continue to follow to assist with d/c plannung.  Cori Razor LCSW [phone]

## 2013-01-12 NOTE — Progress Notes (Signed)
Physical Therapy Treatment Patient Details Name: [name] MRN: [id number] DOB: 10-12-1926 Today's Date: 01/12/2013 Time: 1350-1402 PT Time Calculation (min): 12 min  PT Assessment / Plan / Recommendation  History of Present Illness s/p fall resulting in right hip fx; s/p  right hip posterior hemiarthroplasty   PT Comments   Assisted back to bed total assist and caution to THP.  Increased time to position to comfort and pressure relief.  Pt plans to D/C today for ST Rehab at SNF.   Follow Up Recommendations  SNF;Supervision/Assistance - 24 hour     Does the patient have the potential to tolerate intense rehabilitation     Barriers to Discharge        Equipment Recommendations       Recommendations for Other Services    Frequency Min 3X/week   Progress towards PT Goals Progress towards PT goals: Progressing toward goals  Plan      Precautions / Restrictions Precautions Precautions: Fall;Posterior Hip Restrictions Weight Bearing Restrictions: No RLE Weight Bearing: Weight bearing as tolerated    Pertinent Vitals/Pain C/o pain with act but unable to rate.    Mobility  Bed Mobility Bed Mobility: Sit to Supine Supine to Sit: 1: +2 Total assist Supine to Sit: Patient Percentage: 10% Sitting - Scoot to Edge of Bed: 1: +2 Total assist Sitting - Scoot to Edge of Bed: Patient Percentage: 0% Sit to Supine: 1: +2 Total assist Sit to Supine: Patient Percentage: 10% Details for Bed Mobility Assistance: MAX VC's for direction, proper tech, increase self assist and to decrease fear/anxiety.  Transfers Transfers: Stand Pivot Transfers Sit to Stand: 1: +2 Total assist;From bed;From toilet Sit to Stand: Patient Percentage: 20% Stand to Sit: 1: +2 Total assist;To chair/3-in-1;To toilet Stand to Sit: Patient Percentage: 20% Stand Pivot Transfers: 1: +2 Total assist Stand Pivot Transfers: Patient Percentage: 10% Details for Transfer Assistance: "Bear Hug" stand pivot from recliner  to bed with increased time and caution for THP. Ambulation/Gait Ambulation/Gait Assistance: 1: +2 Total assist Ambulation Distance (Feet): 1 Feet Assistive device: Rolling walker Ambulation/Gait Assistance Details: 100% VC's on upright posture, increase step length and proper use RW.  Pt very nervous with difficulty standing erect and weight shifting to advance LE's.  Gait Pattern: Step-to pattern;Decreased stance time - right;Trunk flexed    PT Goals (current goals can now be found in the care plan section)    Visit Information  Last PT Received On: 01/12/13 Assistance Needed: +2 History of Present Illness: s/p fall resulting in right hip fx; s/p  right hip posterior hemiarthroplasty    Subjective Data      Cognition       Balance     End of Session PT - End of Session Equipment Utilized During Treatment: Gait belt Activity Tolerance: Patient limited by fatigue;Patient limited by pain;Other (comment) Patient left: in bed;with call bell/phone within reach   Felecia Shelling  PTA [facility]  Acute  Rehab Pager      [phone]

## 2013-01-12 NOTE — Discharge Summary (Signed)
Physician Discharge Summary  [name]:[id number] DOB: Jan 26, 1927 DOA: 01/07/2013  PCP: Hollice Espy, MD  Admit date: 01/07/2013 Discharge date: 01/12/2013  Time spent: >30 minutes  Recommendations for Outpatient Follow-up:  1. Reassess BP and start antihypertensive regimen if needed 2. BMET to follow electrolytes and kidney function 3. CBC to follow Hgb trend  Discharge Diagnoses:  Hip fracture, right Fall Benign hypertension COPD (chronic obstructive pulmonary disease) with emphysema Leukocytosis, unspecified ARF (acute renal failure) Anemia of chronic disease with ABLa component first degree AV block  Discharge Condition: stable and improved. Will discharge to SNF for physical rehab  Diet recommendation: heart healthy diet  Filed Weights   01/07/13 2100  Weight: 63.3 kg (139 lb 8.8 oz)    History of present illness:  79 year old female with HTN, tobacco use, COPD/emhysema, presented to [facility] ED after a fall at home. She explains she was trying to pick up an apple in her garden and lost her balance and fell on her right side. She reports right side of the hip is no painful, intermittent and sharp pain is 7/10 in severity when present, worse with movement and with no specific alleviating factors, no similar events in the past, no specific radiating symptoms. Pt denies fevers, chills, no specific neurological symptoms of numbness or tingling, weakness, nochest pain, no shortness of breath, no specific abdominal concerns.  In ED, right hip area xray demonstrated right hip fracture.    Hospital Course:  1. Right displaced femoral neck/hip fracture: Orthopedic consultation appreciated- s/p right hip hemiarthroplasty. Weight bearing as tolerated. Will follow with Dr. Ave Filter in 2 weeks. Per PT will need SNF for rehab. 2. Acute renal failure on stage II chronic kidney disease: Creatinine 0.84 in November 2013, but GFR demonstrated stage 2 insufficiency at baseline. Cr at  discharge 1.2. Likely secondary to intravascular volume depletion, hypotension and ACE inhibitors use. Continue holding nephrotoxic agents, BMET to be follow by PCP. Maintain good hydration. Good urine output. 3. Normocytic anemia, chronic & thrombocytopenia: Anemia worse after surgery. S/p 3 unit PRBc on 8/1-8/3; Hgb 9.5 today. Follow up as an outpatient. Discharged on ferrous sulfate 4. Hypertension: now hypotensive.  -continue holding antihypertensive agents (until follow up with PCP)       -s/p 3 units of PRBC transfused        -BP is better and now stable.  5. Chronic respiratory failure on home O2, COPD, kyphosis, large hiatal hernia: Stable. No wheezing.Continue oxygen. Aggressive incentive spirometry/flutter valve and mobilize ASAP. Continue budenoside but change to nebulizer tx, patient having difficulty with MDI use. 6. Memory impairment/possible dementia. Patient pleasantly confused. Family reports is not new. Continue aricept and follow up with PCP 7. Systolic murmur: Secondary to tricuspid regurgitation per 2-D echo reports. No other significant structural abnormalities  8. First degree AV/block: asymptomatic. And most likely secondary to use of aricept.  Procedures: Right hip bipolar arthroplasty (01/08/13)  Consultations:  Dr. Ave Filter (orthopedic service)  PT/OT  Discharge Exam: Filed Vitals:   01/12/13 0415  BP: 123/64  Pulse: 62  Temp: 98.7 F (37.1 C)  Resp: 18   General exam: Comfortable. AAOX2; NAD and afebrile. (per family members mentation at baseline) Respiratory system: Clear. No increased work of breathing.  Cardiovascular system: S1 & S2 heard, RRR. No JVD, gallops, clicks or pedal edema. Grade 2/6 systolic murmur  Gastrointestinal system: Abdomen is nondistended, soft and nontender. Normal bowel sounds heard.  Central nervous system: Alert and oriented to self and partly to place.  No focal neurological deficits.  Extremities: no edema, reporting pain with  movement only; dressing clear and intact  Discharge Instructions  Discharge Orders   Future Appointments Provider Department Dept Phone   04/30/2013 11:00 AM Barbaraann Share, MD [facility] [phone]   06/17/2013 9:30 AM [facility] [phone]   Eat a light meal the night before the exam but please avoid gaseous foods.   Nothing to eat or drink for at least 8 hours prior to the exam. No gum chewing or smoking the morning of the exam. Please take your morning medications with small sips of water, especially blood pressure medication. If you have several vascular lab exams and will see physician, please bring a snack with you.   06/17/2013 10:00 AM Chuck Hint, MD [facility] [phone]   Future Orders Complete By Expires     Diet - low sodium heart healthy  As directed     Discharge instructions  As directed     Comments:      Take medications as prescribed Follow up with PCP in 10 days Follow up with Dr. Ave Filter (orthopedic service) in 2 weeks PT/OT as per SNF program  Weight bearing as tolerated Keep patient well hydrated    Weight bearing as tolerated  As directed         Medication List    STOP taking these medications       budesonide-formoterol 160-4.5 MCG/ACT inhaler  Commonly known as:  SYMBICORT     lisinopril 5 MG tablet  Commonly known as:  PRINIVIL,ZESTRIL      TAKE these medications       acetaminophen 500 MG tablet  Commonly known as:  TYLENOL  Take 500 mg by mouth every 6 (six) hours as needed for pain.     aspirin 325 MG EC tablet  Take 1 tablet (325 mg total) by mouth 2 (two) times daily.     budesonide 0.25 MG/2ML nebulizer solution  Commonly known as:  PULMICORT  Take 2 mLs (0.25 mg total) by nebulization 2 (two) times daily.     cyanocobalamin 1000 MCG/ML injection  Commonly known as:  (VITAMIN B-12)  Inject 1,000 mcg into the muscle every 30 (thirty)  days.     diazepam 5 MG tablet  Commonly known as:  VALIUM  Take 0.5 tablets (2.5 mg total) by mouth at bedtime as needed for sleep. Sleep     donepezil 5 MG tablet  Commonly known as:  ARICEPT  Take 5 mg by mouth at bedtime.     escitalopram 5 MG tablet  Commonly known as:  LEXAPRO  Take 5 mg by mouth daily.     feeding supplement Liqd  Take 237 mLs by mouth 2 (two) times daily between meals.     ferrous sulfate 325 (65 FE) MG tablet  Take 325 mg by mouth daily with breakfast.     folic acid 1 MG tablet  Commonly known as:  FOLVITE  Take 1 mg by mouth daily.     multivitamin with minerals Tabs tablet  Take 1 tablet by mouth daily.     QUEtiapine 25 MG tablet  Commonly known as:  SEROQUEL  Take 25 mg by mouth 2 (two) times daily.     traMADol 50 MG tablet  Commonly known as:  ULTRAM  Take 1 tablet (50 mg total) by mouth every 6 (six) hours as needed.  Vitamin D (Ergocalciferol) 50000 UNITS Caps capsule  Commonly known as:  DRISDOL  Take 50,000 Units by mouth every 7 (seven) days. Takes on Mondays       Allergies  Allergen Reactions  . Biaxin (Clarithromycin)   . Erythromycin   . Keflex (Cephalexin)   . Macrodantin (Nitrofurantoin Macrocrystal)   . Penicillins   . Sulfa Antibiotics        Follow-up Information   Follow up with Mable Paris, MD. Schedule an appointment as soon as possible for a visit in 2 weeks.   Contact information:   [address] [place] [address] [phone]       Follow up with Hollice Espy, MD. Schedule an appointment as soon as possible for a visit in 10 days.   Contact information:   [address] [place] [address] [phone]        The results of significant diagnostics from this hospitalization (including imaging, microbiology, ancillary and laboratory) are listed below for reference.    Significant Diagnostic Studies: Dg Chest 1 View  01/07/2013   *RADIOLOGY REPORT*   Clinical Data: Preoperative respiratory exam for right hip fracture.  CHEST - 1 VIEW  Comparison: 05/12/2012  Findings: There is a large hiatal hernia behind the heart which is similar in appearance to the prior chest x-ray.  There is no evidence of pulmonary infiltrate, edema or pleural fluid.  The heart is mildly enlarged.  No fractures are identified in the chest.  IMPRESSION: Similar appearance of large hiatal hernia and mild cardiomegaly. No active disease in the chest.   Original Report Authenticated By: Irish Lack, M.D.   Dg Hip Complete Right  01/07/2013   *RADIOLOGY REPORT*  Clinical Data: Fall with right hip deformity.  RIGHT HIP - COMPLETE 2+ VIEW  Comparison: None.  Findings: Acute subcapital hip fracture is noted which traverses the entire femoral neck.  Associated angulation and impaction present.  No evidence of dislocation.  The rest of the bony pelvis appears intact.  IMPRESSION: Acute right subcapital hip fracture.   Original Report Authenticated By: Irish Lack, M.D.   Dg Pelvis Portable  01/08/2013   *RADIOLOGY REPORT*  Clinical Data: Postop right hip replacement surgery.  PORTABLE PELVIS  Comparison: 01/07/2013.  Findings: There is a cemented bipolar type right hip prosthesis in good position.  No complicating features.  No pelvic fractures.  IMPRESSION: Well seated right hip prosthesis without complicating features.   Original Report Authenticated By: Rudie Meyer, M.D.    Microbiology: Recent Results (from the past 240 hour(s))  SURGICAL PCR SCREEN     Status: None   Collection Time    01/08/13  9:57 AM      Result Value Range Status   MRSA, PCR NEGATIVE  NEGATIVE Final   Staphylococcus aureus NEGATIVE  NEGATIVE Final   Comment:            The Xpert SA Assay (FDA     approved for NASAL specimens     in patients over 76 years of age),     is one component of     a comprehensive surveillance     program.  Test performance has     been validated by [employer]  for patients greater     than or equal to 14 year old.     It is not intended     to diagnose infection nor to     guide or monitor treatment.  Labs: Basic Metabolic Panel:  Recent Labs Lab 01/08/13 0405 01/09/13 0359 01/10/13 0453 01/11/13 0630 01/12/13 0432  NA 141 134* 137 136 138  K 4.6 4.6 4.0 4.1 4.3  CL 105 100 105 104 106  CO2 30 27 27 26 27   GLUCOSE 96 107* 93 110* 75  BUN 25* 25* 25* 23 24*  CREATININE 1.46* 1.54* 1.41* 1.21* 1.26*  CALCIUM 9.1 8.5 8.0* 8.2* 8.3*   CBC:  Recent Labs Lab 01/08/13 0405 01/09/13 0359 01/10/13 0453 01/11/13 0630 01/12/13 0432  WBC 7.5 6.3 5.9 6.5 7.4  HGB 10.0* 8.7* 7.8* 7.0* 9.5*  HCT 31.3* 26.8* 23.1* 21.0* 28.5*  MCV 93.4 93.1 91.3 90.9 89.9  PLT 119* 134* PLATELET CLUMPS NOTED ON SMEAR, COUNT APPEARS DECREASED 121* 149*    Signed:  Estelita Iten  [facility] 01/12/2013, 2:25 PM

## 2013-01-12 NOTE — Clinical Documentation Improvement (Signed)
THIS DOCUMENT IS NOT A PERMANENT PART OF THE MEDICAL RECORD  Please update your documentation within the medical record to reflect your response to this query. If you need help knowing how to do this please call [phone].  01/12/13  Dear Dr. Gwenlyn Perking, C[facility]Associates  In a better effort to capture your patient's severity of illness, reflect appropriate length of stay and utilization of resources, a review of the medical record has revealed the following indicators.    Based on your clinical judgment, please clarify and document in a progress note and/or discharge summary the clinical condition associated with the following supporting information:  In responding to this query please exercise your independent judgment.  The fact that a query is asked, does not imply that any particular answer is desired or expected.  Abnormal findings (laboratory, x-ray, pathologic, and other diagnostic results) are not coded and reported unless the physician indicates their clinical significance.   The medical record reflects the following clinical findings, please clarify the diagnostic and/or clinical significance:       Pt with 1st degree AV block and bigeminy per EKG 01/07/2013  Also concerns for systolic murmur with is new per pn 01/09/13  Clarification Needed   Please clarify the following:   The underlying diagnosis for the new systolic murmur and document in pn or d/c summary   Please clarify if you agree pt with 1st degree AV block/bigeminy     Possible Clinical Conditions?                                                                      Other Condition___________________                 Cannot Clinically Determine_________    Supporting Information: Femur fx ARF Chronic resp failure Dementia ABLA  Diagnostics: 2D Echo 01/11/13 LV EF: 65% - 70%    Treatment:  Monitoring   Reviewed: first degree AV block   Thank You,  Enis Slipper  RN, BSN, MSN/Inf, CCDS Clinical  Documentation Specialist [facility] Dept Pager: [phone] / E-mail: [email or web address]  [phone] Health Information Management [place]

## 2013-01-12 NOTE — Progress Notes (Signed)
Pt to be d/c to [facility] today. [employer] medicare has provided authorization for SNF placement and ambulance transport. Pt / family are in agreement with d/c plan.  Cori Razor LCSW [phone]

## 2013-01-12 NOTE — Progress Notes (Signed)
Physical Therapy Treatment Patient Details Name: [name] MRN: [id number] DOB: 17-Sep-1926 Today's Date: 01/12/2013 Time: 1026-1059 PT Time Calculation (min): 33 min  PT Assessment / Plan / Recommendation  History of Present Illness s/p fall resulting in right hip fx; s/p  right hip posterior hemiarthroplasty   PT Comments   POD # 4 R Bipolar Hip 2nd fx.  Assisted pt OOB to [facility] then attempted amb however unable to functionally take steps.     Follow Up Recommendations  SNF;Supervision/Assistance - 24 hour     Does the patient have the potential to tolerate intense rehabilitation     Barriers to Discharge        Equipment Recommendations       Recommendations for Other Services    Frequency Min 3X/week   Progress towards PT Goals Progress towards PT goals: Progressing toward goals  Plan      Precautions / Restrictions Precautions Precautions: Fall;Posterior Hip Restrictions Weight Bearing Restrictions: No RLE Weight Bearing: Weight bearing as tolerated    Pertinent Vitals/Pain C/o pain with activity but inable to rate    Mobility  Bed Mobility Bed Mobility: Supine to Sit;Sitting - Scoot to Edge of Bed Supine to Sit: 1: +2 Total assist Supine to Sit: Patient Percentage: 10% Sitting - Scoot to Edge of Bed: 1: +2 Total assist Sitting - Scoot to Edge of Bed: Patient Percentage: 0% Details for Bed Mobility Assistance: MAX VC's for direction, proper tech, increase self assist and to decrease fear/anxiety.  Transfers Transfers: Sit to Stand;Stand to Sit Sit to Stand: 1: +2 Total assist;From bed;From toilet Sit to Stand: Patient Percentage: 20% Stand to Sit: 1: +2 Total assist;To chair/3-in-1;To toilet Stand to Sit: Patient Percentage: 20% Details for Transfer Assistance: Pt requires verbal and tactile cues for technique, problem solving and attention.  Pt with posterior bias Ambulation/Gait Ambulation/Gait Assistance: 1: +2 Total assist Ambulation Distance (Feet): 1  Feet Assistive device: Rolling walker Ambulation/Gait Assistance Details: 100% VC's on upright posture, increase step length and proper use RW.  Pt very nervous with difficulty standing erect and weight shifting to advance LE's.  Gait Pattern: Step-to pattern;Decreased stance time - right;Trunk flexed     PT Goals (current goals can now be found in the care plan section)    Visit Information  Last PT Received On: 01/12/13 Assistance Needed: +2 History of Present Illness: s/p fall resulting in right hip fx; s/p  right hip posterior hemiarthroplasty    Subjective Data      Cognition       Balance     End of Session PT - End of Session Equipment Utilized During Treatment: Gait belt Activity Tolerance: Patient limited by fatigue;Patient limited by pain;Other (comment) Patient left: in chair;with call bell/phone within reach   Felecia Shelling  PTA [facility]  Acute  Rehab Pager      [phone]

## 2013-01-12 NOTE — Progress Notes (Signed)
PATIENT ID: [name]   4 Days Post-Op Procedure(s) (LRB): ARTHROPLASTY BIPOLAR HIP (Right)  Subjective: No complaints.  "I feel like an old woman".  Objective:  Filed Vitals:   01/12/13 0415  BP: 123/64  Pulse: 62  Temp: 98.7 F (37.1 C)  Resp: 18     R hip dressing c/d/i No calf TTP  Labs:   Recent Labs  01/10/13 0453 01/11/13 0630 01/12/13 0432  HGB 7.8* 7.0* 9.5*   Recent Labs  01/11/13 0630 01/12/13 0432  WBC 6.5 7.4  RBC 2.31* 3.17*  HCT 21.0* 28.5*  PLT 121* 149*   Recent Labs  01/11/13 0630 01/12/13 0432  NA 136 138  K 4.1 4.3  CL 104 106  CO2 26 27  BUN 23 24*  CREATININE 1.21* 1.26*  GLUCOSE 110* 75  CALCIUM 8.2* 8.3*    Assessment and Plan: POD3 ABLA: hgb back to 9.5, asymptomatic Cont wbat Ok for d/c from ortho stdpt F/u 2 wks  VTE proph: [facility]AS[facility] bid, SCDS

## 2013-01-13 ENCOUNTER — Non-Acute Institutional Stay (SKILLED_NURSING_FACILITY): Payer: Medicare Other | Admitting: Internal Medicine

## 2013-01-13 ENCOUNTER — Other Ambulatory Visit: Payer: Self-pay | Admitting: Geriatric Medicine

## 2013-01-13 DIAGNOSIS — S72001E Fracture of unspecified part of neck of right femur, subsequent encounter for open fracture type I or II with routine healing: Secondary | ICD-10-CM

## 2013-01-13 DIAGNOSIS — D638 Anemia in other chronic diseases classified elsewhere: Secondary | ICD-10-CM

## 2013-01-13 DIAGNOSIS — J439 Emphysema, unspecified: Secondary | ICD-10-CM

## 2013-01-13 DIAGNOSIS — F0391 Unspecified dementia with behavioral disturbance: Secondary | ICD-10-CM

## 2013-01-13 DIAGNOSIS — I714 Abdominal aortic aneurysm, without rupture: Secondary | ICD-10-CM

## 2013-01-13 DIAGNOSIS — S72009D Fracture of unspecified part of neck of unspecified femur, subsequent encounter for closed fracture with routine healing: Secondary | ICD-10-CM

## 2013-01-13 DIAGNOSIS — I10 Essential (primary) hypertension: Secondary | ICD-10-CM

## 2013-01-13 DIAGNOSIS — J438 Other emphysema: Secondary | ICD-10-CM

## 2013-01-13 MED ORDER — DIAZEPAM 5 MG PO TABS: 2.5000 mg | ORAL_TABLET | Freq: Every evening | ORAL | Status: DC | PRN

## 2013-01-13 NOTE — Progress Notes (Signed)
Discharge summary sent to payer through [facility]  

## 2013-01-13 NOTE — Progress Notes (Signed)
Clinical Social Work Department CLINICAL SOCIAL WORK PLACEMENT NOTE 01/13/2013  Patient:  [name], [name]  Account Number:  [id number] Admit date:  01/07/2013  Clinical Social Worker:  Cori Razor, LCSW  Date/time:  01/09/2013 04:32 PM  Clinical Social Work is seeking post-discharge placement for this patient at the following level of care:   SKILLED NURSING   (*CSW will update this form in Epic as items are completed)     Patient/family provided with [facility] Department of Clinical Social Work's list of facilities offering this level of care within the geographic area requested by the patient (or if unable, by the patient's family).  01/09/2013  Patient/family informed of their freedom to choose among providers that offer the needed level of care, that participate in Medicare, Medicaid or managed care program needed by the patient, have an available bed and are willing to accept the patient.  01/09/2013  Patient/family informed of [facility]' ownership interest in [facility], as well as of the fact that they are under no obligation to receive care at this facility.  PASARR submitted to EDS on 01/09/2013 PASARR number received from [employer] on 01/09/2013  FL2 transmitted to all facilities in geographic area requested by pt/family on  01/09/2013 FL2 transmitted to all facilities within larger geographic area on   Patient informed that his/her managed care company has contracts with or will negotiate with  certain facilities, including the following:     Patient/family informed of bed offers received:  01/12/2013 Patient chooses bed at  Physician recommends and patient chooses bed at    Patient to be transferred to [facility] on  01/12/2013 Patient to be transferred to facility by [facility]  The following physician request were entered in Epic:   Additional Comments: [employer] medicare provided authorization for SNF / AMB transport.  Cori Razor LCSW [phone]

## 2013-01-14 ENCOUNTER — Non-Acute Institutional Stay (SKILLED_NURSING_FACILITY): Payer: Medicare Other | Admitting: Internal Medicine

## 2013-01-14 DIAGNOSIS — R5383 Other fatigue: Secondary | ICD-10-CM

## 2013-01-14 DIAGNOSIS — R5381 Other malaise: Secondary | ICD-10-CM

## 2013-01-14 LAB — GLUCOSE, CAPILLARY

## 2013-01-14 NOTE — Progress Notes (Signed)
Patient ID: [name], female   DOB: 07-05-1926, 79 y.o.   MRN: [id number]  This is an acute visit.  Facility [facility].  Level of care skilled.  Date is 01/14/2013.  Chief complaint-acute visit secondary to lethargy.  History of present illness.  Patient is an elderly resident who came here earlier this week for rehabilitation after sustaining a right hip fracture that was repaired.  She does have some history of memory deficits questionable dementia.  However since her arrival here she has been quite agitated and at times lethargic according to family this is not really like her.  She is eating and drinking poorly.  Her vital signs are stable.  It appears she has been seen by psychiatric services and her Seroquel has been increased secondary to the agitation I suspect.  Family medical social history as been reviewed per progress note on 01/13/2013.  Medications have been reviewed per MAR.  Review of systems.  Essentially unobtainable secondary to patient's status-she has not really complained of any shortness of breath chest pain but continues to be agitated at times and at times lethargic.  Physical exam.  Temperature is 98.8 pulse 61 respirations 20 blood pressure 146/87 O2 saturation is 97%.  In general this is a frail elderly female currently lying in bed somewhat agitated with exam.  Her skin is warm and dry.  Oropharynx is clear mucous membranes appear a bit dry.  Chest is clear to auscultation with poor respiratory effort.  Heart is regular rate and rhythm with a 2/6 systolic murmur she does not really have significant lower extremity edema.  Her abdomen is soft does not appear to be tender although she's agitated with the exam her bowel sounds are positive.  Russell skeletal right hip surgical site appears fairly benign there is a small amount of drainage on the dressings.  Psych again she is agitated.  Neurologic appears to be grossly intact although limited  exam secondary to her agitation.   labs.  01/12/2013  WBC 7.4 hemoglobin 9.5 platelets 149.  Sodium 138 potassium 4.3 BUN 24 creatinine 1.26.  01/14/2013.  Hemoglobin 10.2 WBC 8.2.  Sodium 135 potassium 4.7 BUN 32 creatinine 1.25 liver function tests within normal limits except albumin of 2.3.  Assessment and plan.  #1-lethargy-challenging situation one would wonder possibly some reaction to the anesthesia during surgery-will check lab work including CBC CMP TSH and a urine culture.  Also she appears to be getting a bit dry - will start an IV normal saline at 60 cc an hour for 2 L and check a basic metabolic panel after IV therapy-hopefully her agitation would not preclude the use of the IV.  Also will monitor vital signs every shift.  Also would have a low threshold for decreasing her Seroquel since  this could lead to lethargy as well  [id number]

## 2013-01-16 ENCOUNTER — Encounter: Payer: Self-pay | Admitting: Internal Medicine

## 2013-01-16 ENCOUNTER — Non-Acute Institutional Stay (SKILLED_NURSING_FACILITY): Payer: Medicare Other | Admitting: Internal Medicine

## 2013-01-16 DIAGNOSIS — I959 Hypotension, unspecified: Secondary | ICD-10-CM

## 2013-01-16 DIAGNOSIS — R4182 Altered mental status, unspecified: Secondary | ICD-10-CM

## 2013-01-16 DIAGNOSIS — R5381 Other malaise: Secondary | ICD-10-CM

## 2013-01-16 DIAGNOSIS — I10 Essential (primary) hypertension: Secondary | ICD-10-CM

## 2013-01-16 DIAGNOSIS — R5383 Other fatigue: Secondary | ICD-10-CM

## 2013-01-16 DIAGNOSIS — S72009D Fracture of unspecified part of neck of unspecified femur, subsequent encounter for closed fracture with routine healing: Secondary | ICD-10-CM

## 2013-01-16 DIAGNOSIS — S72001D Fracture of unspecified part of neck of right femur, subsequent encounter for closed fracture with routine healing: Secondary | ICD-10-CM

## 2013-01-17 DIAGNOSIS — R4182 Altered mental status, unspecified: Secondary | ICD-10-CM

## 2013-01-17 DIAGNOSIS — R5383 Other fatigue: Secondary | ICD-10-CM

## 2013-01-26 ENCOUNTER — Non-Acute Institutional Stay (SKILLED_NURSING_FACILITY): Payer: Medicare Other | Admitting: Internal Medicine

## 2013-01-26 DIAGNOSIS — J449 Chronic obstructive pulmonary disease, unspecified: Secondary | ICD-10-CM

## 2013-01-26 DIAGNOSIS — E43 Unspecified severe protein-calorie malnutrition: Secondary | ICD-10-CM

## 2013-01-26 NOTE — Progress Notes (Signed)
Patient ID: [name], female   DOB: [birth year], 79 y.o.   MRN: [id number] Facility; [facility] Chief complaint; review of medical issues including COPD, renal insufficiency  History; this is an 78 year old woman who came to [place] after suffering a right hip fracture she underwent a right hip hemiarthroplasty. She has a history of COPD on chronic oxygen at 2 L for the last year. Other issues include dementia, tricuspid regurgitation, and chronic renal failure stage II she was not discharged on anticoagulation. Her discharge creatinine was 1.26 and this is repeated on August 8 while in this facility at 1.16.  Review of systems; questionable benefit due to dementia. Respiratory no cough or excess short of breath Cardiac no chest pain  Physical exam; O2 sat 92% on 2 L respirations 18 pulse rate 72 Respiratory very shallow air entry bilaterally but no crackles or wheezes her work of breathing is normal Cardiac I did not appreciate to tricuspid regurgitation murmur there is no evidence of CHF Extremities; significant venous stasis but no evidence of a DVT  Impression/plan Right hip fracture; she appears to be stable. I suspect her dementia and hearing loss will make rehabilitation difficult Chronic renal insufficiency this appears to be stable most recent creatinine at 1.16 Severe hypoalbuminemia with an albumin level of 2.3 on August 6 Anemia hemoglobin was last checked on August 6 at 10.2 Suspect moderate to severe dementia; patient's daughter tells me the patient was in her on home and [place] however the daughter was there at the morning with a paid caregiver there later in the day. Patient was alone at night and also for a brief period in the midafternoon.  The patient's medical issues appear to be stable. Whether she will rehabilitated enough to be independent any extent at home is questionable. Lab work will be repeated next week

## 2013-02-05 ENCOUNTER — Non-Acute Institutional Stay (SKILLED_NURSING_FACILITY): Payer: Medicare Other | Admitting: Internal Medicine

## 2013-02-05 DIAGNOSIS — J4489 Other specified chronic obstructive pulmonary disease: Secondary | ICD-10-CM

## 2013-02-05 DIAGNOSIS — N189 Chronic kidney disease, unspecified: Secondary | ICD-10-CM

## 2013-02-05 DIAGNOSIS — J449 Chronic obstructive pulmonary disease, unspecified: Secondary | ICD-10-CM

## 2013-02-09 DIAGNOSIS — A0472 Enterocolitis due to Clostridium difficile, not specified as recurrent: Secondary | ICD-10-CM

## 2013-02-09 HISTORY — DX: Enterocolitis due to Clostridium difficile, not specified as recurrent: A04.72

## 2013-02-10 NOTE — Progress Notes (Signed)
Patient ID: [name], female   DOB: [birth year], 79 y.o.   MRN: [id number]           PROGRESS NOTE  DATE:  02/04/2013  FACILITY: [facility]   LEVEL OF CARE:   SNF   Acute Visit   HISTORY OF PRESENT ILLNESS:  This is an 79 year-old woman who came to [place] after suffering a right hip fracture, status post right hip hemiarthroplasty.    She has a history of COPD, on chronic oxygen, tricuspid regurgitation, at least moderate dementia, more like moderate to severe.    She has  apparently been rehabilitating satisfactorily.  She has advanced to a regular diet as of yesterday and is eating well.    She had some degree of renal insufficiency in the hospital.    REVIEW OF SYSTEMS:   Other than what I get from the staff, I do not really think this is meaningful in this patient due to severe dementia.    PHYSICAL EXAMINATION:   GENERAL APPEARANCE:  She looks fairly stable.   CHEST/RESPIRATORY:  Shallow, but otherwise clear air entry bilaterally.  No crackles or wheezes.  Her work of breathing is normal.   CARDIOVASCULAR:  CARDIAC:   Heart sounds are very remote.  I did not appreciate tricuspid regurgitation.  There is certainly no evidence of CHF.  In fact, I wonder whether this is volume contraction.   CIRCULATION:   EDEMA/VARICOSITIES:  Extremities:  Venous stasis is evident but continued no evidence of DVT (was not put on postoperative DVT prophylaxis, I suspect due to dementia).  She does have significant PAD, however.    ASSESSMENT/PLAN:  Right hip fracture.  This appears to be at its baseline.  She has significant dementia and hearing loss.  However, in spite of this, her rehabilitation appears to be progressing and she is ambulatory.    Chronic renal insufficiency.  Last lab work was from 01/19/2013.  I will repeat this.  If anything, she appears to be somewhat volume-contracted.    CPT CODE: 11914

## 2013-02-11 ENCOUNTER — Ambulatory Visit (HOSPITAL_COMMUNITY)
Admit: 2013-02-11 | Discharge: 2013-02-11 | Disposition: A | Discharge status: Home / Self Care | Payer: Medicare Other | Attending: Internal Medicine | Admitting: Internal Medicine

## 2013-02-11 DIAGNOSIS — J449 Chronic obstructive pulmonary disease, unspecified: Secondary | ICD-10-CM | POA: Insufficient documentation

## 2013-02-11 DIAGNOSIS — J4489 Other specified chronic obstructive pulmonary disease: Secondary | ICD-10-CM | POA: Insufficient documentation

## 2013-02-11 DIAGNOSIS — J189 Pneumonia, unspecified organism: Secondary | ICD-10-CM | POA: Insufficient documentation

## 2013-02-11 DIAGNOSIS — K449 Diaphragmatic hernia without obstruction or gangrene: Secondary | ICD-10-CM | POA: Insufficient documentation

## 2013-02-12 ENCOUNTER — Non-Acute Institutional Stay (SKILLED_NURSING_FACILITY): Payer: Medicare Other | Admitting: Internal Medicine

## 2013-02-12 DIAGNOSIS — J441 Chronic obstructive pulmonary disease with (acute) exacerbation: Secondary | ICD-10-CM

## 2013-02-16 LAB — GLUCOSE, CAPILLARY
Glucose-Capillary: 124 mg/dL — ABNORMAL HIGH (ref 70–99)
Glucose-Capillary: 87 mg/dL (ref 70–99)
Glucose-Capillary: 89 mg/dL (ref 70–99)

## 2013-02-16 NOTE — Progress Notes (Signed)
Patient ID: [name], female   DOB: July 21, 1926, 79 y.o.   MRN: [id number]           PROGRESS NOTE  DATE:  02/11/2013  FACILITY: [facility]    LEVEL OF CARE:   SNF   Acute Visit   CHIEF COMPLAINT:  Loose cough.    HISTORY OF PRESENT ILLNESS:  This is an 79 year-old woman who initially came to [place] after suffering a right hip fracture.  She underwent a right hip hemiarthroplasty.    She has a history of chronic COPD, on oxygen for the last year.    She also has fairly advanced dementia, tricuspid regurgitation, and chronic renal insufficiency.     She was noted by the staff to have increasing cough and some dyspnea.    Her O2 sats, however, have been stable.    REVIEW OF SYSTEMS:  Really does not seem possible in this patient secondary to cognitive impairment.    PHYSICAL EXAMINATION:   GENERAL APPEARANCE:  She does indeed have a loose cough, although she is not in overt distress and does not have accessory muscle use.    CHEST/RESPIRATORY:  Decreased air entry bilaterally at the bases.   Apparently some wheezing was heard earlier, although I did not appreciate this today.   CARDIOVASCULAR:  CARDIAC:  Soft pansystolic murmur.  JVP is not elevated.   EDEMA/VARICOSITIES:  She has 2+ coccyx edema and 1+ pedal edema.    ASSESSMENT/PLAN:  Question early exacerbation of her COPD.  She will need augmentation of her nebulizer treatments.  Facility had ordered an x-ray.  I agree with this.  Close monitoring is definitely in order for this woman who has significant and chronic respiratory failure.  She is on Pulmicort nebulizers, but she is not on a beta agonist.  I am going to start her on Ventolin nebulizers.     CPT CODE: 40981

## 2013-02-17 LAB — GLUCOSE, CAPILLARY: Glucose-Capillary: 77 mg/dL (ref 70–99)

## 2013-02-20 ENCOUNTER — Non-Acute Institutional Stay (SKILLED_NURSING_FACILITY): Payer: Medicare Other | Admitting: Internal Medicine

## 2013-02-20 DIAGNOSIS — S72001F Fracture of unspecified part of neck of right femur, subsequent encounter for open fracture type IIIA, IIIB, or IIIC with routine healing: Secondary | ICD-10-CM

## 2013-02-20 DIAGNOSIS — D638 Anemia in other chronic diseases classified elsewhere: Secondary | ICD-10-CM

## 2013-02-20 DIAGNOSIS — R109 Unspecified abdominal pain: Secondary | ICD-10-CM

## 2013-02-20 DIAGNOSIS — S72009D Fracture of unspecified part of neck of unspecified femur, subsequent encounter for closed fracture with routine healing: Secondary | ICD-10-CM

## 2013-02-20 DIAGNOSIS — N289 Disorder of kidney and ureter, unspecified: Secondary | ICD-10-CM

## 2013-02-20 DIAGNOSIS — F0391 Unspecified dementia with behavioral disturbance: Secondary | ICD-10-CM

## 2013-02-20 DIAGNOSIS — E8809 Other disorders of plasma-protein metabolism, not elsewhere classified: Secondary | ICD-10-CM

## 2013-02-20 NOTE — Progress Notes (Signed)
Patient ID: [name], female   DOB: 18-Oct-1926, 79 y.o.   MRN: [id number] Facility; [facility] Chief complaint;--for management of right hip fracture status post repair-COPD-renal insufficiency-dementia-   History; this is an 79 year old woman who came to [place] after suffering a right hip fracture she underwent a right hip hemiarthroplasty. She has a history of COPD on chronic oxygen at 2 L for the last year. Other issues include dementia, tricuspid regurgitation, and chronic renal failure stage II she was not discharged on anticoagulation. Her discharge creatinine was 1.26 and this is repeated on August 28 and was 1.13.  Clinically nursing staff says she's doing well-at first there was some increased confusion and very poor appetite but apparently this has turned around fairly significantly.  Apparently according to family she has been complaining of a stomach ache at times--staff also noted some diarrhea earlier today.  Tonight the patient says that she is having no abdominal pains thinks it was something she ate denies any nausea or vomiting-says this is just something she felt earlier in the day that appears to have resolved.  Her weight continues to be relatively stable adrenal that time some elevated systolics these do not appear to be consistent but we will have to keep an eye on it     Family medical social history reviewed per history and physical on 01/12/2013.  Medications have been reviewed per MAR.  Marland Kitchen   Review of systems; somewhat limited secondary to dementia but appears to be fairly accurate historian.  General denies any fever or chills says she feels well.   Respiratory no cough or excess short of breath Cardiac no chest pain GI-as stated above does not complaining of any abdominal pain nausea or vomiting tonight apparently she ate fairly well at supper. She ate about 50-75% of her meal.  GU-does not complaining of dysuria.  Muscle skeletal status post right hip  fracture with repair-she is not complaining of pain.  Neurologic-no complaints of headache or dizziness.  Psych-does have a history of dementia however her mental status is considerably improved from what it was when she was first admitted to this facility   Physical exam; temperature 97.8 pulse 61 respirations 20 blood pressure 157/77-132/74-frequently in this range her weight is stable at 129.  In general is a pleasant elderly female in no distress lying comfortably in bed.  Her skin is warm and dry she does have venous stasis changes of her shins bilaterally and also some old bruising of her lower arms bilaterally.  Eyes pupils are equal round reactive to light sclera and conjunctiva are clear extraocular movements intact visual acuity appears intact she does have prescription lenses.  Oropharynx clear mucous membranes moist.  Chest is clear to auscultation bilaterally with somewhat reduced air entry no labored breathing.     Cardiac I did not appreciate to tricuspid regurgitation murmur there is no evidence of CHF--regular rate and rhythm with occasional irregular beat  Abdominal exam is quite benign it is soft nontender with active bowel sounds  Extremities; significant venous stasis but no evidence of a DVT--moves all extremities x4 again somewhat limited on the fractured hip side--do not see anything acutely concerning  Neurologic-grossly intact her speech is clear.  Psych she is oriented to self somewhat confused but very pleasant and appropriate and follows verbal commands well   Labs.  02/16/2013.  Fasting glucose 77.  02/05/2013.  WBC 7.0 hemoglobin 10.6 platelets 207.  Sodium 139 potassium 4 BUN 28 creatinine 1.13.  02/12/2013.  BNP-450.9.  01/14/2013.  Liver function tests within normal limits albumin of 2.3   Impression/plan Right hip fracture; she appears to be stable.-pain is well controlled...  Chronic renal insufficiency this appears to be  stable most recent creatinine at 1.13 Severe hypoalbuminemia with an albumin level of 2.3 on August 6--we'll update this Anemia hemoglobin appears to be stable at 10.6--she is on iron Suspect moderate to severe dementia; patient's daughter said the patient was in her on home and [facility] however the daughter was there at the morning with a paid caregiver there later in the day. Patient was alone at night and also for a brief period in the midafternoon.--Her mental status has improved since her admission here-getting her to eat at that time was a challenge apparently this has turned around which is encouraging. She is on Seroquel with a history of behavioral disturbances which appeared to be much more stable than a earlier in her stay--she is also on Aricept.  History COPD-she appears to be stable in this regards continues on Pulmicort as well as albuterol nebulizers  Questionable hypertension- is not on any agent to notice some elevated systolics in the 150s although I also see systolics in the 130s and 120s-will order blood pressure checks daily with a log in provider box for review.    Abdominal pain-this appears resolved-continue to monitor for any recurrence or diarrhea notify provider if reoccurs   [phone]

## 2013-02-23 ENCOUNTER — Non-Acute Institutional Stay (SKILLED_NURSING_FACILITY): Payer: Medicare Other | Admitting: Internal Medicine

## 2013-02-23 DIAGNOSIS — R152 Fecal urgency: Secondary | ICD-10-CM

## 2013-02-23 DIAGNOSIS — R197 Diarrhea, unspecified: Secondary | ICD-10-CM

## 2013-02-24 ENCOUNTER — Non-Acute Institutional Stay (SKILLED_NURSING_FACILITY): Payer: Medicare Other | Admitting: Internal Medicine

## 2013-02-24 DIAGNOSIS — A0472 Enterocolitis due to Clostridium difficile, not specified as recurrent: Secondary | ICD-10-CM | POA: Insufficient documentation

## 2013-02-24 NOTE — Progress Notes (Signed)
Patient ID: [name], female   DOB: 05-27-1927, 79 y.o.   MRN: [id number]   This is an acute visit.  Level of care skilled.  Facility [facility].  Chief complaint-acute visit followup C. difficile.  History of present illness.  Patient is a pleasant elderly resident who I saw last week for routine visit-apparently earlier on that day she did complain of some abdominal discomfort when I saw her said it had resolved.  Also there was some history of diarrhea but apparently that had resolved as well.  Nonetheless we continued to monitor for diarrhea and apparently over the weekend diarrhea developed as-a C. difficile culture was ordered which did come back positive.   She has been afebrile she appears to be at her baseline and is not complaining of any fever abdominal pain or discomfort According to nursing staff she is eating and drinking fairly well-apparently there has been no noted diarrhea on second shift--apparently this appears to be resolving.  Her vital signs continued to be stable she is afebrile.  Family medical social history has been reviewed per history and physical on 01/12/2013.  Medications have been reviewed per [facility]   Review of systems-somewhat limited secondary to dementia.  In general no complaints of fever or chills.  Respiratory does not complain of cough or shortness of breath.  Cardiac-does not complaining of chest pain.  GI-does not complaining of abdominal pain.--Had extensive diarrhea this weekend but apparently this is improving--no nausea or vomiting noted  Physical exam.  Temperature is 97.9 pulse 72 respirations 20 blood pressure 136/75.  .  Labs.  02/23/2013.  Sodium 139 potassium 3.5 BUN 14 creatinine 0.88.  Liver function tests within normal limits except albumin of 2.4  02/12/2013.  WBC 7.5 hemoglobin 11.3 platelets 170  02/02/2013.  WBC 6.7 hemoglobin 10.4 platelets 258.  Assessment and plan.  #1-C-diff  -she has been started on  Flagyl 500 mg 3 times a day --currently on day 2 of 10 She   is also on a probiotic.  Her metabolic panel appears fairly unremarkable will be updated tomorrow  Clinically she appears to be doing well.

## 2013-03-01 NOTE — Progress Notes (Signed)
Patient ID: [name], female   DOB: 08/25/1926, 79 y.o.   MRN: [id number]           PROGRESS NOTE  DATE:  02/23/2013  FACILITY: [facility]    LEVEL OF CARE:   SNF   Acute Visit   CHIEF COMPLAINT:  Fecal urgency.    HISTORY OF PRESENT ILLNESS:  This is an 79 year-old woman who came to [place] after a right hip fracture.  She is status post right hip hemiarthroplasty.  On top of this, she has severe chronic COPD, on oxygen chronically.   She also has fairly advanced dementia.    A family member is present and stating that she is having fecal urgency.  The family member wonders whether this is impaction with overflow.  If she is having diarrhea, I do not know that this has been described by the nursing staff.    LABORATORY DATA:  Last lab work from 02/23/2013 showed an albumin of 2.4.  Otherwise, her BUN and creatinine were within normal limits at 14 and 0.88, respectively.    REVIEW OF SYSTEMS:  Really not possible from the patient due to the cognitive issues.   GI:  I really do not have much information on the GI complaints at this point, although she had just struggled out a bowel movement and did not really have one this morning .    CURRENT MEDICATIONS:  Medication list is reviewed.   She is on none of the usual offenders in terms of alteration of bowel function.  She is not on laxatives routinely.    PHYSICAL EXAMINATION:   VITAL SIGNS:   O2 SATURATIONS:  97% on room air.   RESPIRATIONS:  20.   GENERAL APPEARANCE:  The patient looks to be stable to me.   CHEST/RESPIRATORY:   Shallow air entry bilaterally.  However, work of breathing is normal.  There is no accessory muscle use.  This is certainly better than when I saw her two weeks ago.   CARDIOVASCULAR:  CARDIAC:   Heart sounds are distant.  JVP is not elevated.   GASTROINTESTINAL:  ABDOMEN:   Not distended.  No masses.   LIVER/SPLEEN/KIDNEYS:  No liver, no spleen.    ASSESSMENT/PLAN:  Fecal urgency.  At this  point, I am really not certain about this.  Usually, people who have impaction with overflow are also distended and she is not.  She may need a rectal exam.  I will have them check her for fecal impaction and monitor her for her diarrheal type presentation.     CPT CODE: 40981

## 2013-03-05 NOTE — Progress Notes (Signed)
Patient ID: [name], female   DOB: 01-Aug-1926, 79 y.o.   MRN: [id number]  This is an acute visit.  Facility [facility].  Level of care skilled.  The date is 01/13/2013.  Chief complaint-acute visit status post hospitalization for right hip fracture with repair.  Mr. Carolin Sicks is.  Patient is an 79 year old female with history of hypertension tobacco use COPD who fell at home.  Apparently she lost her balance and fell on her right side and sustained a right hip fracture.  She subsequently with underwent a right hip replacement mass postop she did have anemia and did require a transfusion  She also did have some renal insufficiency discharge creatinine was 1.2 this apparently is a bit above her baseline will need to update this.  She does have a history of significant COPD on chronic O2 apparently at home.  This will have to be monitored here as well as with her relative immobility.  She is quite agitated today apparently this is fairly new for her although she does have a listed history of memory impairment with possible dementia she is on Aricept we will have to monitor this as well as her by mouth intake.  Currently she does not appear to be in pain however she does have significant agitation which apparently is not really like her.  Previous medical history.  Status post right hip fracture with repair.  Renal insufficiency.  Anemia postop.  Also an element of chronic anemia.  Hypertension.  COPD.  Question dementia.  Systolic murmur secondary to tricuspid regurgitation per 2-D echo.  First-degree AV block asymptomatic question secondary to Aricept.  Depression.  History of abdominal aortic aneurysm.  History of basal cell carcinoma on the left wrist.  History of back injury x2.  History of right arm fracture.  Previous surgical history.  Previous history of hemorrhoid surgery.  Social history.  Apparently she quit smoking about 8 months ago this included  cigarettes she has a 63-pack-year smoking history.  No history of smokeless tobacco alcohol or illicit drug use.  Family history unavailable patient cannot really provide this information.  Hospital studies.  Plan 30th 2014.  Chest x-ray-no active disease large hiatal hernia and mild cardiomegaly.  01/08/2013.  X-ray right-  hipwell seated right hip prosthesis without complicating features    Medications.  Tylenol 500 mg every 6 hours when necessary.  Aspirin 325 mg twice a day.  Pulmicort twice a day.  Vitamin B 12,000 mcg q. monthly.  Valium 2.5 mg each bedtime when necessary insomnia.  Aricept 5 mg daily.  Desyrel 5 mg daily.  Ferrous sulfate 325 mg daily.  Folic acid 1 mg daily.  Multivitamin daily.  Seroquel 25 mg twice a day.  Ultram 50 mg every 6 hours when necessary pain.  Vitamin D 50,000 units q. weekly.  Review of systems-essentially unattainable-patient is quite agitated however she is not really specifically complaining of pain shortness of breath there's been no nausea or vomiting-agitation and confusion appears to be the main issue.  Physical exam.  Temperature is 98.8 pulse 61 respirations 20 blood pressure 146/87 O2 saturation is 92% on oxygen.  In general this is a frail elderly female quite agitated lying in bed she does not appear to be in distress however.   eyes cannot really assess secondary to patient closing when I attempt to examine them.  Oropharynx she does have numerous extractions mucous membranes appear fairly moist.  Her skin she does have some old bruising on her  arms bilaterally this does not appear to be new.  On surgical site right hip Steri-Strips are in place there is a small amount of serous drainage I do not really see anything concerning here there is no surrounding erythema of significance as well.  Chest is clear to auscultation with poor respiratory effort no labored breathing.  Heart is regular rate and rhythm  with a 2/6 systolic murmur.  Abdomen soft does not appear to be tender there are positive sounds  Muscle skeletal she does move all extremities x4 with limited right lower extremity because of the surgery-not really following commands secondary to agitation but I do not see any gross deformities.  Neurologic again difficult exam secondary to patient agitation but I do not see focal deficits.  Psych she is oriented to self but didn't agitated so complete exam somewhat challenging.  Labs.  01/12/2013.  WBC 7.5 hemoglobin 9.5 which is an improvement platelets 149 also improved.  Sodium 138 potassium 4.3 BUN 24 creatinine 1.26 which appears to be trending down some.  Assessment and plan.  #1-right hip fracture with repair-she will need therapy again with her agitation this may be an issue afraid this will be short-term and when she becomes acclimated up it facility this will improve.  She is receiving Ultram as needed for pain.  Will continue to monitor her surgical site looks fairly benign.  She does continue on aspirin for anticoagulation.  #2-history of renal insufficiency-we'll update a metabolic panel apparently this was starting to improve with hydration in the hospital.  #3-COPD-at this point appears relatively stable follow complete exam was difficult continue current treatment.  #4-hypertension-blood pressure somewhat elevated she's not on any blood pressure medicine I suspect the elevated systolic could very well be due to agitation will monitor this for now.  #5-anemia-thought to element of chronic disease as well as acute blood loss from surgery-Will update his CBC hemoglobin appears to be trending up.--She is on iron  #6-history of memory impairment dementia-she is quite agitated this may be partly due because of the new surroundings and recent trauma and surgery-Will continue to monitor this for now certainly if it affects her rehabilitation course will be more  aggressive since she is just arrived in facility would like to give this some time.  Certainly encourage her to eat and drink.  She does continue on Aricept and also for Lexapro for I. suspect some associated concerns about depression-she also receives Valium for insomnia at night-.  I do note she is on Seroquel apparently for some history of psychosis in the past with behaviors  #r 7 history of abdominal aortic aneurysm-she did have a CT scan back in June 2014 which showed diameter of 5.1 x 5.cm1-apparently this is being monitored   [id number]-of note greater than 45 minutes spent assessing patient-reviewing her hospital records-and coordinating and formulating plan of care for numerous diagnoses-of note greater than 50% of time spent formulating and coordinating plan of care

## 2013-03-06 ENCOUNTER — Encounter: Payer: Self-pay | Admitting: Internal Medicine

## 2013-03-06 DIAGNOSIS — R5383 Other fatigue: Secondary | ICD-10-CM | POA: Insufficient documentation

## 2013-03-06 NOTE — Progress Notes (Signed)
Patient ID: [name], female   DOB: Jul 01, 1926, 79 y.o.   MRN: [id number]  This is an acute visit.  Level care skilled.  Facility [facility].  The date is 01/16/2013.  Chief complaint-acute visit secondary to lethargy followup hypotension.  History of present illness.  Patient is an elderly female who has had complicated course since her admission here for rehabilitation.  She sustained a fall with a right hip fracture that was surgically repaired and she was brought here for rehabilitation.  However her stay here has been marked by agitation as well as lethargy and has been somewhat of a challenging situation.  Nursing staff has also noted some hypotension I am following up on this.  She does have a listed history of hypertension but I do not see that she is on any medications in fact per discharge review these were held secondary to low blood pressures in the hospital.  However when I did take a blood pressure on her today it was actually somewhat high at 164/70 she had been somewhat agitated however which could contribute to that  Previous blood pressure is he was 133/69.  She does not appear symptomatic hypotension whatsoever Will have to monitor this.  The other issue was some lethargy we experienced earlier this week she did receive some IV fluids and tolerated this well she does have some agitation however.  However she has frequent lethargy according to nursing staff and they remain concerned I do note she has been started on Seroquel in the morning as well as an evening and this is secondary to the agitation however with her increased lethargy which is fairly persistent at times according to nursing staff especially during the day we will hold the Seroquel in the morning.  Patient continues to be a poor historian but apparently is not complaining of any pain or shortness of breath.  Family medical social history has been reviewed per previous progress note on 01/14/2013 in  discharge summary on 01/12/2013.  Medications have been reviewed per [facility] she does continue on Aricept as well as Lexapro.  She is on iron for anticoagulation status post hip or  .  Review of systems-very limited please see history of present illness.  Physical exam.  Temperature is 97.9 pulse 64 respirations 20 blood pressure listed as 133/69 manually I checked it it was 164/70.  In general this is a frail elderly female in no distress lying in bed she is somewhat agitated when attempts are made to examine her otherwise sleeping.  Heart is regular rate and rhythm without murmur gallop she continues with some mild edema to the right leg status post op.  Chest is clear to auscultation with poor respiratory effort no labored breathing.  Abdomen is soft questionable tenderness to palpation suspect this is more due to the invasive maneuver bowel sounds are positive.  Muscle skeletal surgical site right hip covered with dry dressing per nursing staff this appears to be healing unremarkably.  Muscle skeletal does move all extremities with limited movement of her right leg secondary to the surgery.  Neurologic somewhat limited exam secondary to patient agitation but I do not see any focal deficits she does move all her extremities cranial nerves appear intact tubals appear to be equal round reactive to light she does speak a little confused.  In psych-she is oriented to self somewhat agitated but at other times lethargic.  Labs.  01/14/2013.  VWBC 8.2 hemoglobin 10.2 platelets 187.  Sodium 135 potassium 4.7  BUN 32 creatinine 1.25.  01/16/2013.  Sodium 138 potassium 4.8 BUN 29 creatinine 1.16.  Of note urine culture obtained did not show any growth.  Assessment and plan.  #1-lethargy-her labs actually look okay she will need to have fluids pushed aggressively however in obtaining updated BMP on Sunday, August 10.  This could be medication related as well will DC the a.m.  Seroquel since she appears to have this lethargy more during the day according to nursing staff.  Also monitor vital signs pulse ox every shift.  She is certainly easily arousable and still gets agitated at times despite the lethargy so this certainly could have a psychological component but we'll see how she does clinically here  She did tolerate her diabetes well which is encouraging-hopefully she will become more responsive and this would certainly benefit her with her rehabilitation.  #2-hypotension-I do not really see evidence of this per blood pressure readings today-Will continue to monitor I do not really think this may be contributing to her lethargy although we'll have to monitor  [id number]-of note greater than 25 minutes spent assessing patient discussing her status extensively with staff-and coordinating plan of care

## 2013-03-06 NOTE — Progress Notes (Signed)
Patient ID: [name], female   DOB: 13-Jun-1926, 79 y.o.   MRN: [id number]   This is an acute visit.  Care skilled.  Facility [facility].  A date is 01/17/2013.  Chief complaint-acute visit followup lethargy poor by mouth intake agitation.  History of present illness.  Patient is an elderly resident who we have been following pretty closely last few days secondary to mental status changes not eating well lethargy which apparently is not like her.  We have ordered labs IVs these appear to be fairly unremarkable the lab done yesterday showed creatinine 1.16 BUN of 29 although apparently she's not eating and drinking well well.  Her a.m. Seroquel also has been held secondary to concerns about lethargy today she appears to be somewhat agitated at times certainly does not appear to be lethargic-actually spit out her meds today.  She does not appear to be in pain has tramadol l when necessary has not taken it the past 2 days  Clinically her vital signs continued to be stable.  Family medical social history has been reviewed  discharge summary on 01/12/2013.  Medications have been reviewed per MAR.  Review of systems essentially unobtainable the patient's confusion agitation per nursing staff does not appear to be in pain but is noncompliant spitting out meds appears to be less lethargic today it appears.  Physical exam.  She is afebrile pulse 72 respirations 17 blood pressure 150/70 this was taken manually Essie list was 133/69.  In general this is a frail elderly female who is alert and agitated at times.  Her skin is warm and dry skin turgor does not appear grossly impaired.  Heart is regular rate and rhythm.  Chest is clear to auscultation with poor respiratory effort.  Abdomen is soft does not appear to be tender positive bowel sounds.  Extremities she does have some edema of her right leg this appears to be relatively baseline pedal pulse is intact but somewhat  reduced.  Labs.  01/16/2013.  Sodium 138 potassium 4.8 BUN 29 creatinine 1.16.  01/14/2013 hemoglobin was 10.2 white count was 8.2 platelets 187.  Liver function tests within normal limits except albumin of 2.3.  Assessment and plan.  Altered mental status-this continues to be a challenging situation her vital signs are stable her Seroquel was held this morning this will be a balancing act with now it appears some behaviors.  At this point we'll continue to hold the Seroquel but may have to readdress this in short order.  does not appear to be grossly dehydrated we will get a metabolic panel again  she certainly is at risk of this apparently with her body by mouth intake.  Also continue to monitor her vital signs are pulse ox every shift.  She does not appear to be in any distressbut  altered mental status is a concerning certainly for her long-term prognosis and potential rehabilitation-for now will monitor the labs would not do further IVs and hopefully with acclamation  to facility this may get a little better.  [id number]

## 2013-03-12 ENCOUNTER — Ambulatory Visit (INDEPENDENT_AMBULATORY_CARE_PROVIDER_SITE_OTHER): Payer: Medicare Other | Admitting: Adult Health

## 2013-03-12 ENCOUNTER — Encounter: Payer: Self-pay | Admitting: Adult Health

## 2013-03-12 VITALS — BP 130/80 | Ht 61.0 in | Wt 127.4 lb

## 2013-03-12 DIAGNOSIS — Z1212 Encounter for screening for malignant neoplasm of rectum: Secondary | ICD-10-CM

## 2013-03-12 DIAGNOSIS — N950 Postmenopausal bleeding: Secondary | ICD-10-CM

## 2013-03-12 HISTORY — DX: Postmenopausal bleeding: N95.0

## 2013-03-12 LAB — HEMOCCULT GUIAC POC 1CARD (OFFICE)

## 2013-03-12 NOTE — Progress Notes (Signed)
Subjective:     Patient ID: [name], female   DOB: December 09, 1926, 79 y.o.   MRN: [id number]  HPI [name] is a 79 year old white female who [facility] for ?vaginal bleeding,she has a UTI and is taking meds.She denies any pain.She was wheeled over by daughter in law and is using oxygen.  Review of Systems See HPI Reviewed past medical,surgical, social and family history. Reviewed medications and allergies. Past Medical History  Diagnosis Date  . Hypertension   . Memory loss   . Anemia   . Chronic back pain   . Chronic kidney disease   . Anxiety   . Panic attack   . Chronic respiratory failure 04/30/2012  . COPD (chronic obstructive pulmonary disease)   . AAA (abdominal aortic aneurysm)   . Cancer     skin Left wrist BCC  . DVT (deep venous thrombosis)   . Aortic aneurysm, abdominal   . Back injury     has had broken back x 2  . Knee problem     Right knee pain  . Arm fracture, right   . Clostridium difficile enteritis 02/2013    treated   . PMB (postmenopausal bleeding) 03/12/2013  Current outpatient prescriptions:diazepam (VALIUM) 5 MG tablet, Take 0.5 tablets (2.5 mg total) by mouth at bedtime as needed for sleep. Sleep, Disp: 15 tablet, Rfl: 5    Objective:   Physical Exam BP 130/80  Ht 5\' 1"  (1.549 m)  Wt 127 lb 6.4 oz (57.788 kg)  BMI 24.08 kg/m2   Skin warm and dry.Pelvic: external genitalia is normal in appearance for age, vagina: artophic, cervix:smooth and atrophic,no lesions, uterus: normal size, shape and contour, non tender, no masses felt, adnexa: no masses or tenderness noted.On rectal exam she has a rectocele, no masses felt and hemoccult was negative.  Assessment:     PMB    Plan:     Return in about 5 days for pelvic US to assess uterus

## 2013-03-12 NOTE — Patient Instructions (Addendum)
Return in 5 days for [place]

## 2013-03-12 NOTE — Assessment & Plan Note (Signed)
Will get US 

## 2013-03-13 ENCOUNTER — Non-Acute Institutional Stay (SKILLED_NURSING_FACILITY): Payer: Medicare Other | Admitting: Internal Medicine

## 2013-03-13 DIAGNOSIS — I10 Essential (primary) hypertension: Secondary | ICD-10-CM

## 2013-03-13 DIAGNOSIS — K625 Hemorrhage of anus and rectum: Secondary | ICD-10-CM

## 2013-03-13 DIAGNOSIS — R197 Diarrhea, unspecified: Secondary | ICD-10-CM

## 2013-03-13 DIAGNOSIS — N950 Postmenopausal bleeding: Secondary | ICD-10-CM

## 2013-03-13 NOTE — Progress Notes (Signed)
Patient ID: [name], female   DOB: 09/28/1926, 79 y.o.   MRN: [id number]  This is an acute visit.  Level of care skilled.  Facility [facility].  She complaint-acute visit secondary to question rectal bleeding.  History of present illness.  Patient is a elderly resident here for rehabilitation after sustaining a right hip fracture she has numerous other issues including COPD.  Also has had some vaginal bleeding she actually saw gynecology yesterday and they have ordered a pelvic ultrasound for next week.  According to nursing staff there've been no further episodes of vaginal bleeding.  Guaiac stools also were ordered i2 of them apparently   have come back positive  I do not see that she has a history of rectal bleeding or GI issues I do not see any colonoscopy in Epic.  Clinically she is  baseline actually continues to have a pretty good appetite all of this was an issue initially in her stay.  She is on Cipro apparently for some history of diarrhea although according to nursing staff this appears to be resolving  Appears labwork has been fairly unremarkable although we will need to recheck her CBC.  Family medical social history as been reviewed per progress note on 01/13/2013.  Medications have been reviewed per MAR.  I do note she is on aspirin 325 mg twice a day.  Review of systems. Lid secondary to dementia- General denies any fever or chills.  Skin-does have tendency for skin tears and bruising.  Respiratory no complaint shortness of breath or cough.  Cardiac denies any chest pain or palpitations.  GI-some history of diarrhea does not complaining of that today however denies any abdominal pain nausea or vomiting.  GU-as stated in history of present illness she denies any dysuria.  Musculoskeletal status post right hip fracture this appears to be healing fairly unremarkable he does not complaining of joint pain.  Neurologic no complaints of dizziness or  headache.  Psych-does have some history of confusion does not appear depressed she is alert responsive pleasant  .  Physical exam.  She is afebrile pulse 63 respirations 20 blood pressure 176/77-143/86-in this range.  In general--this is a pleasant alert elderly female in no distress lying comfortable in bed.  Her skin is warm and dry she does have some bruising noted and a small skin tear on the lower right arm covered with clear dressing.  Heart is regular rate and rhythm she has some mild ankle edema.  Chest is clear to auscultation without any labored breathing.  Abdomen is soft nontender with positive bowel sounds.  GU is within normal limits I do not see any drainage bleeding or rashes  Rectal-I did not note any hemorrhoids digital exam appear to be guaiac negative although the stool sample here was quite minimal.  Muscle skeletal does move all extremities x4 usually ambulates in a wheelchair she is status post right hip fracture in she has a well-healed scar on the right hip.  Neurologic is grossly intact her speech is clear.  Psych she is oriented to self pleasant conversant can give me some of her family history and explain the pictures on her wall.  .    Labs.  02/23/2013-.  Sodium 139 potassium 3.5 BUN 14 creatinine 0.88.  Liver function tests within normal limits except albumin of 2.4.  02/12/2013.  WBC 7.5 hemoglobin 11.3 platelets 170.  Assessment and plan.  #1-rectal bleeding?-Clinically she appears to be stable.--Stool sample from the  guaiac exam was  minimal but appeared to be negative  Will update CBC metabolic panel tomorrow.  Also we'll DC aspirin 325 twice a day if there is bleeding certainly this is probably aggravating it I do note she was not on this prior to her hospitalization and hip fracture I suspect this was essentially for DVT prophylaxis  Patient with multiple other medical issues-this point will be fairly conservative-will add  Prilosec daily--she is already on iron.  This was discussed with Dr. Leanord Hawking via phone  #2-diarrhea-this appears to be resolving again we'll update metabolic panel tomorrow to make sure electrolytes and renal function are stable  #3-history of vaginal bleeding-this is followed by OB/GYN-apparently Pelvic ultrasound is pending  . #4-hypertension-will start low dose Norvasc 2.5 mg a day-monitor blood pressures Q shift with log for provider review  [id number]-of note greater than 30 minutes spent assessing patient-reviewing her records especially in regards to history of anemia or GI bleed-and formulating and coordinating plan of care  .

## 2013-03-17 ENCOUNTER — Encounter: Payer: Self-pay | Admitting: Adult Health

## 2013-03-17 ENCOUNTER — Inpatient Hospital Stay (INDEPENDENT_AMBULATORY_CARE_PROVIDER_SITE_OTHER): Payer: Medicare Other

## 2013-03-17 ENCOUNTER — Ambulatory Visit (INDEPENDENT_AMBULATORY_CARE_PROVIDER_SITE_OTHER): Payer: Medicare Other | Admitting: Adult Health

## 2013-03-17 VITALS — BP 140/88

## 2013-03-17 DIAGNOSIS — N950 Postmenopausal bleeding: Secondary | ICD-10-CM

## 2013-03-17 NOTE — Patient Instructions (Addendum)
Follow up prn

## 2013-03-17 NOTE — Progress Notes (Signed)
Subjective:     Patient ID: [name], female   DOB: [birth year], 79 y.o.   MRN: [id number]  HPI [name] is back for [place] for PMB.no complaints today.  Review of Systems See HPI Reviewed past medical,surgical, social and family history. Reviewed medications and allergies.     Objective:   Physical Exam BP 140/88 discussed [place] results with pt and daughter in law, the uterus measured 4.9 x 4.7 x 2.6 cm with multiple calcifications, the endometrium was 1.6 mm and symmetric, and the ovaries are small with masses.Discussed no need to do anything else that this time    Assessment:     PMB- resolved    Plan:    Follow up prn

## 2013-03-25 ENCOUNTER — Non-Acute Institutional Stay (SKILLED_NURSING_FACILITY): Payer: Medicare Other | Admitting: Internal Medicine

## 2013-03-25 DIAGNOSIS — A0472 Enterocolitis due to Clostridium difficile, not specified as recurrent: Secondary | ICD-10-CM

## 2013-03-25 NOTE — Progress Notes (Signed)
Patient ID: [name], female   DOB: 15-Jun-1926, 79 y.o.   MRN: [id number] [facility] Chief complaint diarrhea History; as the patient to noted to have 3 liquid bowel movements starting 2 days ago. She has already had 3 stools a. Stools come back positive for a clusters in the facetal toxin. She is also fecally occult blood positive. Was treated with ciprofloxacin at the end of September for a UTI.  Review of systems GI no nausea or vomiting. She is apparently eating and drinking satisfactorily GU no clear dysuria although the severity of her dementia limits proper questioning.  Physical exam; Gen. the patient does not look to be unwell  Cardiac heart sounds are normal she does not appear to be overtly dehydrated Abdomen no liver no spleen no tenderness. Extremities no edema  Impression/plan #1 pseudomembranous colitis I am going to start her on Flagyl at 250 mg 3 times a day for 14 days. I've spoken to the staff and the family about ensuring adequate fluid intake. She is not on a diuretic. #2 occult blood positive stool; it is possible that this is because of the pseudomembranous colitis itself. I will continue to monitor. Electrolytes CBC ordered for early next week

## 2013-03-30 ENCOUNTER — Non-Acute Institutional Stay (SKILLED_NURSING_FACILITY): Payer: Medicare Other | Admitting: Internal Medicine

## 2013-03-30 DIAGNOSIS — E8809 Other disorders of plasma-protein metabolism, not elsewhere classified: Secondary | ICD-10-CM

## 2013-03-30 DIAGNOSIS — K625 Hemorrhage of anus and rectum: Secondary | ICD-10-CM

## 2013-03-30 DIAGNOSIS — A0472 Enterocolitis due to Clostridium difficile, not specified as recurrent: Secondary | ICD-10-CM

## 2013-03-30 NOTE — Progress Notes (Signed)
Patient ID: EVE REY, female   DOB: 1926/09/19, 79 y.o.   MRN: [id number]  Facility; [facility] Chief complaint; followup pseudomembranous colitis. History; this is a patient who I saw last week care. She had had a 2 day history of her frequent bowel movements which were liquid. She was not systemically ill however assay of this is stool showed positive for Clostridium difficile toxin. I placed her on Flagyl which she appears to be tolerating well. I am not completely certain of the frequency of the bowel movements however it appears that she is very stable and looks somewhat better.  Lab work from this morning shows a white count of 6.9 a hemoglobin of 11.5 her sodium is 138 BUN 17 creatinine is 1.01. Her total CO2 was 34 however I suspect this may be related to compensation for her her severe COPD although it might reflect early volume contraction as well  Review of systems GI no nausea or vomiting. She is apparently eating and drinking satisfactorily GU no clear dysuria although the severity of her dementia limits proper questioning.  Physical exam; Gen. the patient does not look to be unwell  Cardiac heart sounds are normal she does not appear to be overtly dehydrated Abdomen no liver no spleen no tenderness. Extremities no edema  Impression/plan #1 pseudomembranous colitis; she appears to be tolerating the Flagyl well, appears systemically well, we will monitor her diarrheal frequency. It would appear that the Flagyl was been effective #2 occult blood positive stool; it is possible that this is because of the pseudomembranous colitis itself. Her hemoglobin is 11.5 this appears to be stable vs. previous value #3 COPD this is severe but stable on chronic oxygen #4 moderate to severe protein calorie malnutrition she had an albumin level on September 15 of 2.4. At some point we'll need to recheck this

## 2013-04-15 ENCOUNTER — Other Ambulatory Visit: Payer: Self-pay | Admitting: *Deleted

## 2013-04-15 MED ORDER — TRAMADOL HCL 50 MG PO TABS: ORAL_TABLET | ORAL | Status: DC

## 2013-04-16 ENCOUNTER — Non-Acute Institutional Stay (SKILLED_NURSING_FACILITY): Payer: Medicare Other | Admitting: Internal Medicine

## 2013-04-16 DIAGNOSIS — D649 Anemia, unspecified: Secondary | ICD-10-CM

## 2013-04-16 DIAGNOSIS — S72009D Fracture of unspecified part of neck of unspecified femur, subsequent encounter for closed fracture with routine healing: Secondary | ICD-10-CM

## 2013-04-16 DIAGNOSIS — J438 Other emphysema: Secondary | ICD-10-CM

## 2013-04-16 DIAGNOSIS — N950 Postmenopausal bleeding: Secondary | ICD-10-CM

## 2013-04-16 DIAGNOSIS — S72001D Fracture of unspecified part of neck of right femur, subsequent encounter for closed fracture with routine healing: Secondary | ICD-10-CM

## 2013-04-16 DIAGNOSIS — I10 Essential (primary) hypertension: Secondary | ICD-10-CM

## 2013-04-16 DIAGNOSIS — R197 Diarrhea, unspecified: Secondary | ICD-10-CM

## 2013-04-16 DIAGNOSIS — J439 Emphysema, unspecified: Secondary | ICD-10-CM

## 2013-04-16 DIAGNOSIS — E8809 Other disorders of plasma-protein metabolism, not elsewhere classified: Secondary | ICD-10-CM

## 2013-04-16 DIAGNOSIS — N289 Disorder of kidney and ureter, unspecified: Secondary | ICD-10-CM

## 2013-04-16 NOTE — Progress Notes (Signed)
Patient ID: [name], female   DOB: March 29, 1927, 79 y.o.   MRN: [id number] Facility; [facility]  Chief complaint;--for management of right hip fracture status post repair-COPD-renal insufficiency-dementia-   History; this is an 79 year old woman who came to [place] after suffering a right hip fracture she underwent a right hip hemiarthroplasty. She has a history of COPD on chronic oxygen at 2 L for the last year. Other issues include dementia, tricuspid regurgitation, and chronic renal failure stage II .  Clinically nursing staff says she's doing well-  at first there was some increased confusion and very poor appetite but apparently this has turned around fairly significantly However she developed some diarrhea once again-she does have some history of C. difficile in the past and has had it appears a couple courses of Flagyl with resolution.  Nursing staff today says she is just not quite herself today not eating quite as well and they have noticed the diarrhea this morning  She also has had a history of vaginal bleeding this has been evaluated by GYN and they have determined at this point no further followup certainly follow up as needed.  She has no complaints today although she says she's not feeling completely 100%-does not complaining of any abdominal pain nausea or vomiting chest pain or shortness of breath      Family medical social history reviewed per history and physical on 01/12/2013.   Medications have been reviewed per MAR.  Marland Kitchen  Review of systems; somewhat limited secondary to dementia but appears to be fairly accurate historian.  General denies any fever or chills .  Respiratory no cough or excess short of breath  Cardiac no chest pain  GI-as stated above does not complaining of any abdominal pain nausea or vomiting--has diarrhea t  .  Muscle skeletal status post right hip fracture with repair-she is not complaining of pain.  Neurologic-no complaints of headache or dizziness.   Psych-does have a history of dementia however her mental status is considerably improved from what it was when she was first admitted to this facility    Physical exam;  Temperature is 98.2 pulse 65 respirations 18 blood pressure 134/72 weight is 124.5 this appears to be relatively stable.  .  In general is a pleasant elderly female in no distress sitting in her wheelchair.  Her skin is warm and dry she does have venous stasis changes of her shins bilaterally and also some old bruising of her lower arms bilaterally.  Eyes pupils are equal round reactive to light sclera and conjunctiva are clear extraocular movements intact visual acuity appears intact she does have prescription lenses.  Oropharynx clear mucous membranes moist.  Chest is clear to auscultation bilaterally with somewhat reduced air entry no labored breathing.  Cardiac Ir there is no evidence of CHF--regular rate and rhythm with occasional irregular beat  Abdominal exam is quite benign it is soft nontender with active bowel sounds  Extremities; significant venous stasis but no evidence of a DVT--moves all extremities x4 agai  Neurologic-grossly intact her speech is clear.  Psych she is oriented to self somewhat confused but very pleasant and appropriate and follows verbal commands well   Labs  03/30/2013.  WBC 6.9 hemoglobin 11.5 platelets 212.  Sodium 138 potassium 3.8 BUN 17 creatinine 1.01.      .  02/16/2013.  Fasting glucose 77.  02/05/2013.  WBC 7.0 hemoglobin 10.6 platelets 207.  Sodium 139 potassium 4 BUN 28 creatinine 1.13.  02/12/2013.  BNP-450.9.  01/14/2013.  Liver function tests within normal limits albumin of 2.3   Impression/plan #1-diarrhea-with patient's history will empirically start her on Flagyl 250 mg 3 times a day for 14 days and await culture results also will order charting of her stools and contact precautions.  #2-  Right hip fracture; she appears to be stable.-pain is well  controlled. #3..  Chronic renal insufficiency this appears to be stable most recent creatinine at 0.88--Will update especially in light of recent diarrhea  #4 Severe hypoalbuminemia with an albumin level of 2.3 on August 6--we'll update this    5 Anemia hemoglobi appears stable back in August at 10.6 we will need to update this--she is on Iron-- also would like to see where her white count is --  also is on B12 supplementation Will update this level as wel l Number6l Suspect moderate to severe dementia--this has been relatively stable she is on Aricept-, Pharmacy  has left a recommendation for a trial reduction of her Seroquel I would be in agreement with this -- her behaviors have been stable recently although this was an issue earlier in her stay--Will check hemoglobin A1c since she is on Seroquel--will reduce this to 12.5 mg each bedtime  Number 7.  History COPD-she appears to be stable in this regards continues on Pulmicort as well as albuterol nebulizers  #8   Hypertension-as somewhat variable systolics per chart review ranging from a high of 158/71-lowest 122/60-I do not see consistent elevations-she recently was started on low dose Norvasc will continue to monitor for now  #9  History of vaginal bleeding-I have reviewed records from GYN-a pelvic ultrasound was done which apparently did not show any acute issues it did show symmetrical  endometrium did show multiple calcifications and small ovaries-per notes from GYN on October 7 --only followup when necessary  #10  Some history of occult positive stool-this may have been due to the C. difficile-we will update hemoglobin it has been stable-she is on a proton pump inhibitor-as well as iron   .    [id number] note greater than 35 minutes spent assessing patient-discussing her status with nursing staff-reviewing her medical records-including review in EPIC--and coordinating and formulating a plan of care for numerous diagnoses

## 2013-04-30 ENCOUNTER — Ambulatory Visit: Payer: Medicare Other | Admitting: Pulmonary Disease

## 2013-05-12 ENCOUNTER — Non-Acute Institutional Stay (SKILLED_NURSING_FACILITY): Payer: Medicare Other | Admitting: Internal Medicine

## 2013-05-12 DIAGNOSIS — N950 Postmenopausal bleeding: Secondary | ICD-10-CM

## 2013-05-12 DIAGNOSIS — I10 Essential (primary) hypertension: Secondary | ICD-10-CM

## 2013-05-12 DIAGNOSIS — S72001D Fracture of unspecified part of neck of right femur, subsequent encounter for closed fracture with routine healing: Secondary | ICD-10-CM

## 2013-05-12 DIAGNOSIS — K625 Hemorrhage of anus and rectum: Secondary | ICD-10-CM

## 2013-05-12 DIAGNOSIS — A0472 Enterocolitis due to Clostridium difficile, not specified as recurrent: Secondary | ICD-10-CM

## 2013-05-12 DIAGNOSIS — F03918 Unspecified dementia, unspecified severity, with other behavioral disturbance: Secondary | ICD-10-CM

## 2013-05-12 DIAGNOSIS — S72009D Fracture of unspecified part of neck of unspecified femur, subsequent encounter for closed fracture with routine healing: Secondary | ICD-10-CM

## 2013-05-12 DIAGNOSIS — J438 Other emphysema: Secondary | ICD-10-CM

## 2013-05-12 DIAGNOSIS — F0391 Unspecified dementia with behavioral disturbance: Secondary | ICD-10-CM

## 2013-05-12 DIAGNOSIS — D638 Anemia in other chronic diseases classified elsewhere: Secondary | ICD-10-CM

## 2013-05-12 DIAGNOSIS — N289 Disorder of kidney and ureter, unspecified: Secondary | ICD-10-CM

## 2013-05-12 DIAGNOSIS — J439 Emphysema, unspecified: Secondary | ICD-10-CM

## 2013-05-12 NOTE — Progress Notes (Signed)
Patient ID: [name], female   DOB: 1926-09-08, 79 y.o.   MRN: [id number] Facility; [facility] This is a routine visit.    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 She has a history of COPD on chronic oxygen at 2 L for the last year. Other issues include dementia, tricuspid regurgitation, and chronic renal failure stage II .  Clinically nursing staff says she continues to do  well- at first there was some increased confusion and very poor appetite but apparently this has turned around fairly significantly--her most recent weight at 123.7 appears relatively baseline with last month's weight  However she  has developed some diarrhea once again-she does have some history of C. difficile in the past and has had it appearsseveral courses of Flagyl with resolution.--And a C. difficile result that obtained from the lab today shows once again she has  tested positive  Nursing staff today says she continues to be herself out ambulating about in the hallways somewhat confused very pleasant does not complaining of any pain shortness of breath-apparently she is having significant diarrhea  She also has had a history of vaginal bleeding this has been evaluated by GYN and they have determined at this point no further followup certainly follow up as needed  I recently started her on low-dose Norvasc secondary somewhat elevated systolic blood pressure readings recent blood pressures 128/85-145/81-150/70-.     Family medical social history reviewed per history and physical on 01/12/2013.  Medications have been reviewed per MAR.  Marland Kitchen  Review of systems; somewhat limited secondary to dementia but appears to be fairly accurate historian.  General denies any fever or  chills .  Respiratory no cough or excess short of breath  Cardiac no chest pain  GI-as stated above does not complaining of any abdominal pain nausea or vomiting--has diarrhea t  .  Muscle skeletal status post right hip fracture with repair-she is not complaining of pain.  Neurologic-no complaints of headache or dizziness.  Psych-does have a history of dementia however her mental status is considerably improved from what it was when she was first admitted to this facility--   Physical exam;   . Temperature 97.5 pulse 78 respirations 22 blood pressure 128/85-145/81--150/70-most recently weight is relatively stable at 123.7  .  In general is a pleasant elderly female in no distress sitting in her wheelchair.  Her skin is warm and dry she does have venous stasis changes of her shins bilaterally and also some old bruising of her lower arms bilaterally.  Eyes pupils are equal round reactive to light sclera and conjunctiva are clear extraocular movements intact visual acuity appears intact she does have prescription lenses.  Oropharynx clear mucous membranes moist.  Chest is clear to auscultation bilaterally with somewhat reduced air entry no labored breathing.  Cardiac Ir there is no evidence of CHF--regular rate and rhythm with occasional irregular beat  Abdominal exam is quite benign it is soft nontender with active bowel sounds  Extremities; significant venous stasis but no evidence of a DVT--moves all extremities x4 agai  Neurologic-grossly intact her speech is clear.  Psych she is oriented to self somewhat confused but very pleasant and appropriate and follows verbal commands well   Labs   03/30/2013.  WBC 6.9 hemoglobin 11.5 platelets 212.  Sodium  138 potassium 3.8 BUN 17 creatinine 1.01.  .  02/16/2013.  Fasting glucose 77.  02/05/2013.  WBC 7.0 hemoglobin 10.6 platelets 207.  Sodium 139 potassium 4 BUN 28 creatinine 1.13.  02/12/2013.  BNP-450.9.  01/14/2013.  Liver function  tests within normal limits albumin of 2.3   Impression/plan  #1-diarrhea- --This has tested positive once again for C. difficile-secondary to recurrent C. difficile infections Will start vancomycin by mouth this time 250 mg every 6 hours for 14 days-she also continues on probiotic.  #2-  Right hip fracture; she appears to be stable.-pain is well controlled--he does have orders for Ultram as well as Tylenol when necessary.  #3..  Chronic renal insufficiency this appears to be stable -Will update lab I saw lab ordered for last month but I don't see the results in the chart nonetheless will warrant an update especially with her diarrhea Number 4 Severe hypoalbuminemia with an albumin level of 2.3 on August 6--we'll update this  5  Anemia hemoglobi appears stable back in October at 11.5   we will need to update this--she is on Iron-- -  also is on B12 supplementation Will update this level as wel  l  Number6l  Suspect moderate to severe dementia--this has been relatively stable she is on Aricept- As well as Seroquel dose was recently reduced per pharmacy recommendation and appears to have tolerated the lower dose of Seroquel well ---no additional behaviors have been noted --update hemoglobin A1c secondary to high-risk med-this was ordered last month but I do not see the results Number 7.  History COPD-she appears to be stable in this regards continues on Pulmicort as well as albuterol nebulizers  #8  Hypertension- variable systolics ranging from the 120s to 140s occasionally 150s- she has been started on low dose Norvasc--we'll increase this to 5 mg a day also monitor blood pressures daily with a log in provider book for review  #9  History of vaginal bleeding-I previously reviewed records from GYN-a pelvic ultrasound was done which apparently did not show any acute issues it did show symmetrical endometrium did show multiple calcifications and small ovaries-per notes from GYN on October 7  --only followup when necessary  #10  Some history of occult positive stool-this may have secondary to her C. difficile infection in the past-her hemoglobin remained stable she is on iron as well as a proton pump inhibitor.  [id number]

## 2013-06-02 ENCOUNTER — Non-Acute Institutional Stay (SKILLED_NURSING_FACILITY): Payer: Medicare Other | Admitting: Internal Medicine

## 2013-06-02 DIAGNOSIS — W19XXXA Unspecified fall, initial encounter: Secondary | ICD-10-CM

## 2013-06-02 DIAGNOSIS — J439 Emphysema, unspecified: Secondary | ICD-10-CM

## 2013-06-02 DIAGNOSIS — J438 Other emphysema: Secondary | ICD-10-CM

## 2013-06-02 DIAGNOSIS — I10 Essential (primary) hypertension: Secondary | ICD-10-CM

## 2013-06-02 DIAGNOSIS — W19XXXD Unspecified fall, subsequent encounter: Secondary | ICD-10-CM

## 2013-06-02 DIAGNOSIS — R05 Cough: Secondary | ICD-10-CM

## 2013-06-02 DIAGNOSIS — R197 Diarrhea, unspecified: Secondary | ICD-10-CM

## 2013-06-02 NOTE — Progress Notes (Signed)
Patient ID: [name], female   DOB: 21-Jun-1926, 79 y.o.   MRN: [id number]   This is an acute visit.  Level care skilled.  Facility [facility]         Chief complaint;--acute visit followup CDF-also productive cough     History; this is an 79 year old woman who came to [place] after suffering a right hip fracture she underwent a right hip hemiarthroplasty. She has a history of COPD on chronic oxygen at 2 L for the last year. Other issues include dementia, tricuspid regurgitation, and chronic renal failure stage II   Actually the most acute issue recently has been what appears to be recurrent CDF.  She has received several courses of Flagyl with resolution and then it restarts.  She has just completed a course of vancomycin by mouth last week for another episode.  Nursing staff said she had some diarrhea today-however discussing this further it appears that is not truly watery stool that it originally was when she was being treated for C. Difficile-- but now there is a mixture of somewhat formed stool with occasional watery stool but this is not consistent.  she's not really complaining of any abdominal pain her vital signs are stable.  She has developed a cough productive of what appears to be some   grayish phlegm.  She does not really complain of shortness of breath actually came in here on oxygen but has been off this now for some time O2 sats continue to be in the 90s.     I recently started her on low-dose Norvasc secondary somewhat elevated systolic blood pressure readings recent blood pressures 136/75-157/83-118/67-139/84-.       Family medical social history reviewed per history and physical on 01/12/2013.   Medications have been reviewed per MAR.   Marland Kitchen   Review of systems; somewhat limited secondary to dementia .   General denies any fever or chills .   Respiratory --has a cough...but not really short of breath--hx COPD  Cardiac no chest pain   GI-as stated above does not  complaining of any abdominal pain nausea or vomiting--has ??loose stoolst   .   Muscle skeletal status post right hip fracture with repair-she is not complaining of pain.   Neurologic-no complaints of headache or dizziness.   Psych-does have a history of dementia however her mental status is considerably improved from what it was when she was first admitted to this facility--    Physical exam;     Temperature is 97.4 pulse 62 respirations 18 blood pressure 136/75 O2 saturations are in the 90s on room air   .   In general is a pleasant elderly female in no distress sitting in her wheelchair.   Her skin is warm and dry she does have venous stasis changes of her shins bilaterally and also some old bruising of her lower arms bilaterally.   Eyes pupils are equal round reactive to light sclera and conjunctiva are clear extraocular movements intact visual acuity appears intact she does have prescription lenses.   Oropharynx clear mucous membranes moist--she did cough up some phlegm that appear to be grayish in color.   Chest--she did have some coarse breath sounds posterior fields that  Mostly cleared  with cough -- somewhat reduced air entry-- no labored breathing.   Cardiac Ir there is no evidence of CHF--regular rate and rhythm with occasional irregular beat   Abdominal exam is quite benign it is soft nontender with active bowel sounds  Neurologic-grossly intact her speech is clear.   Psych she is oriented to self somewhat confused but very pleasant and appropriate and follows verbal commands well    Labs  05/13/2013.  WBC 5.7 hemoglobin 11.4 platelets 208.  Sodium 140 potassium 3.6 BUN 13 creatinine 0.87.  Liver function tests within normal limits except albumin of 2.7   .      03/30/2013.   WBC 6.9 hemoglobin 11.5 platelets 212.   Sodium 138 potassium 3.8 BUN 17 creatinine 1.01.   .   02/16/2013.   Fasting glucose 77.   02/05/2013.   WBC 7.0 hemoglobin 10.6 platelets 207.    Sodium 139 potassium 4 BUN 28 creatinine 1.13.   02/12/2013.   BNP-450.9.   01/14/2013.   Liver function tests within normal limits albumin of 2.3    Impression/plan   #1-diarrhea- This actually appears to be improving upon further discussion with nursing staff there is now some apparently somewhat formed stool she is not complaining of abdominal pain and continue to monitor this-- certainly if there is more of a watery presentation consistently we will need to recheck C. difficile culture  She does continue on a probiotic  Also update CBC with differential as well as metabolic panel tomorrow morning -- .   #2-cough-vital signs appear to be stable Will monitor vital signs O2 sats-also will order Robitussin routinely every 4 hours x48 hours  And then prn  apparently she is receiving this.--Also with some congestion noted on exam will check a chest x-ray in the morning tomorrow.--Will have to be judicious with antibiotic use especially with her history of C. Difficile    also has albuterol nebulizers available when necessary--and is on Pulmicort twice a day routinely  .    3 Hypertension-this appears to be improving I do not note consistent systolic elevations mostly pressures I noted were in the 1- teens to 130s with occasional spike above that but not consistent continue to monitor she is on Norvasc 5 mg a day  Addendum-later this evening patient did apparently have a fall--she apparently slid out of her wheelchair-initial assessment did not show any injuries and I did reevaluate her later in the evening and did not note any changes from her baseline-neurologically she is grossly intact I do not see any deformities of her extremities-she at times does complain of some right hip discomfort she does have a previous history of right hip repair-exam is quite benign but she at times does complain of pain will obtain a hip x-ray tomorrow as well   Nursing staff will follow neuro and vital  signs  [id number]     .

## 2013-06-03 ENCOUNTER — Ambulatory Visit (HOSPITAL_COMMUNITY)
Admit: 2013-06-03 | Discharge: 2013-06-03 | Disposition: A | Discharge status: Home / Self Care | Payer: Medicare Other | Source: Skilled Nursing Facility | Attending: Internal Medicine | Admitting: Internal Medicine

## 2013-06-03 ENCOUNTER — Non-Acute Institutional Stay (SKILLED_NURSING_FACILITY): Payer: Medicare Other | Admitting: Internal Medicine

## 2013-06-03 DIAGNOSIS — R05 Cough: Secondary | ICD-10-CM | POA: Insufficient documentation

## 2013-06-03 DIAGNOSIS — R059 Cough, unspecified: Secondary | ICD-10-CM | POA: Insufficient documentation

## 2013-06-03 DIAGNOSIS — M8448XA Pathological fracture, other site, initial encounter for fracture: Secondary | ICD-10-CM | POA: Insufficient documentation

## 2013-06-03 DIAGNOSIS — J438 Other emphysema: Secondary | ICD-10-CM

## 2013-06-03 DIAGNOSIS — J439 Emphysema, unspecified: Secondary | ICD-10-CM

## 2013-06-03 DIAGNOSIS — R0989 Other specified symptoms and signs involving the circulatory and respiratory systems: Secondary | ICD-10-CM | POA: Insufficient documentation

## 2013-06-03 DIAGNOSIS — M25559 Pain in unspecified hip: Secondary | ICD-10-CM | POA: Insufficient documentation

## 2013-06-07 ENCOUNTER — Non-Acute Institutional Stay (SKILLED_NURSING_FACILITY): Payer: Medicare Other | Admitting: Internal Medicine

## 2013-06-07 DIAGNOSIS — R197 Diarrhea, unspecified: Secondary | ICD-10-CM

## 2013-06-07 DIAGNOSIS — R7309 Other abnormal glucose: Secondary | ICD-10-CM

## 2013-06-07 DIAGNOSIS — E876 Hypokalemia: Secondary | ICD-10-CM

## 2013-06-07 DIAGNOSIS — A0472 Enterocolitis due to Clostridium difficile, not specified as recurrent: Secondary | ICD-10-CM

## 2013-06-07 DIAGNOSIS — R739 Hyperglycemia, unspecified: Secondary | ICD-10-CM

## 2013-06-07 DIAGNOSIS — D72829 Elevated white blood cell count, unspecified: Secondary | ICD-10-CM

## 2013-06-07 NOTE — Progress Notes (Signed)
Patient ID: [name], female   DOB: December 31, 1926, 79 y.o.   MRN: [id number] Family medical social history reviewed per previous progress notes including 04/16/2013 This is an acute visit.  Level of care skilled.  Facility [facility].  Chief complaint-acute visit secondary to followup question diarrhea-CDF-decreased appetite.  History of present illness.  Patient is a pleasant elderly resident who has had a history of some recurrent C. difficile.  She just completed a course of vancomycin had previously been on courses of Flagyl.  Apparently her stool has solidified some however according to her nurse knows her quite well last few days appears to still have some diarrhea and is eating more poorly which is often presentation when she is dealing with the C. difficile infection.  She also was noted on lab done on December 24 to have a low potassium of 2.9 this is being supplemented.  Currently she has no acute complaints but according to family and staff she is somewhat more weaker with a poor appetite is not herself.  Her vital signs continued to be stable she is afebrile.  Family medical social history reviewed per previous progress notes including 03/30/2013--December 2 as well as 06/02/2013  Medications have been reviewed per [facility].  Review of systems is somewhat limited secondary to dementia.  In general denies any fever or chills.  Respiratory distress complaining of shortness of breath or cough.  Cardiac no chest pain.  GI he does not complaining of abdominal pain according to nursing she does have some continued diarrhea -sometimes semi-formed at times; at other times watery but this is persisting  GU-denies any dysuria or burning with urination.  Neurologic c-no complaints of headache or dizziness.  Muscle skeletal does not complaining of hip pain she did complain of that last week x-ray was negative for any acute process.  Physical exam.  Temperature is 97.8 pulse 82  respirations 18 blood pressure 117/74.  In general this is a somewhat frail elderly female in no distress sitting comfortably in bed.  Her skin is warm and dry.  Oropharynx clear mucous membranes moist.  Chest has reduced breath sounds but no rhonchi rales or wheezes.  Heart is regular rate and rhythm with occasional irregular beats with a 2/6 systolic murmur.  Abdomen is soft nondistended nontender with positive bowel sounds.  GU I do not appreciate any rash or drainage or suprapubic tenderness  Muscle skeletal-moves all extremities at baseline limited exam since she was in bed.  Neurologic is grossly intact her speech is clear cranial nerves intact I do not see any focal deficits.  Psych she is oriented to self pleasant and appropriate appears to have her baseline outgoing personality.  Labs.  06/03/2013.  WBC 6.5 hemoglobin 11.8 platelets 249.  e Sodium 137 potassium 2.9 BUN 10 creatinine 0.79.  Assessment plan.  #1-poor appetite has continued diarrhea-secondary to recurrent C. Difficile challenging diagnosis-- will obtain an updated culture apparently she still has watery stool at times-also will restart vancomycin for followup later in the week by Dr. [name] start vancomycin 250 mg 1 tab every 6 hours for 14 additional days again this will bear followup later in the week-consider tapering this down would like Dr. [name] input however.  Also would like to update lab work including a CBC with differential and basic metabolic panel today-especially in light of her hypokalemia which is being supplemented with 40 mEq of potassium  In regards to poor appetite she is on Ensure and Prostat apparently takes this  fairly well although her by mouth intake has diminished-apparently she does continue with decent fluid intake.   Addendum.  We have obtained updated labs which is significant for an elevated white count of 13.5 with granulocytes percent elevated at  80-.  Metabolic panel is fairly unremarkable except for elevated glucose of 193.  Her sodium is 137 potassium 4.7 BUN 19 creatinine 0.97.  With elevated white count one would be suspicious again of the CDiF-again will start the vancomycin and monitor.  In regards to hypokalemia this appears resolved will update a basic metabolic panel and CBC later in the week.  Also will obtain CBGsBID for now secondary to the elevated glucose on metabolic panel--notify provider--if greater than 200  [id number]-of note greater than 25 minutes spent assessing patient-discussing her status with family at bedside as well as with nursing staff-and coordinating her plan of care    .

## 2013-06-08 LAB — GLUCOSE, CAPILLARY
Glucose-Capillary: 76 mg/dL (ref 70–99)
Glucose-Capillary: 141 mg/dL — ABNORMAL HIGH (ref 70–99)

## 2013-06-09 LAB — GLUCOSE, CAPILLARY: Glucose-Capillary: 122 mg/dL — ABNORMAL HIGH (ref 70–99)

## 2013-06-10 ENCOUNTER — Ambulatory Visit: Payer: Medicare Other | Admitting: Vascular Surgery

## 2013-06-10 LAB — GLUCOSE, CAPILLARY
Glucose-Capillary: 82 mg/dL (ref 70–99)
Glucose-Capillary: 143 mg/dL — ABNORMAL HIGH (ref 70–99)

## 2013-06-11 LAB — GLUCOSE, CAPILLARY
GLUCOSE-CAPILLARY: 76 mg/dL (ref 70–99)
GLUCOSE-CAPILLARY: 97 mg/dL (ref 70–99)

## 2013-06-12 LAB — GLUCOSE, CAPILLARY
GLUCOSE-CAPILLARY: 220 mg/dL — AB (ref 70–99)
Glucose-Capillary: 76 mg/dL (ref 70–99)
Glucose-Capillary: 212 mg/dL — ABNORMAL HIGH (ref 70–99)
Glucose-Capillary: 113 mg/dL — ABNORMAL HIGH (ref 70–99)

## 2013-06-13 LAB — GLUCOSE, CAPILLARY
GLUCOSE-CAPILLARY: 167 mg/dL — AB (ref 70–99)
Glucose-Capillary: 71 mg/dL (ref 70–99)

## 2013-06-14 LAB — GLUCOSE, CAPILLARY
Glucose-Capillary: 81 mg/dL (ref 70–99)
Glucose-Capillary: 114 mg/dL — ABNORMAL HIGH (ref 70–99)

## 2013-06-15 LAB — GLUCOSE, CAPILLARY
GLUCOSE-CAPILLARY: 98 mg/dL (ref 70–99)
GLUCOSE-CAPILLARY: 99 mg/dL (ref 70–99)

## 2013-06-16 LAB — GLUCOSE, CAPILLARY
Glucose-Capillary: 73 mg/dL (ref 70–99)
Glucose-Capillary: 146 mg/dL — ABNORMAL HIGH (ref 70–99)

## 2013-06-17 ENCOUNTER — Encounter: Payer: Self-pay | Admitting: Vascular Surgery

## 2013-06-17 ENCOUNTER — Ambulatory Visit (INDEPENDENT_AMBULATORY_CARE_PROVIDER_SITE_OTHER): Payer: Medicare Other | Admitting: Vascular Surgery

## 2013-06-17 ENCOUNTER — Ambulatory Visit (HOSPITAL_COMMUNITY)
Admission: RE | Admit: 2013-06-17 | Discharge: 2013-06-17 | Disposition: A | Discharge status: Home / Self Care | Payer: Medicare Other | Source: Ambulatory Visit | Attending: Vascular Surgery | Admitting: Vascular Surgery

## 2013-06-17 VITALS — BP 158/81 | HR 69 | Ht 61.0 in | Wt 118.0 lb

## 2013-06-17 DIAGNOSIS — I714 Abdominal aortic aneurysm, without rupture, unspecified: Secondary | ICD-10-CM

## 2013-06-17 LAB — GLUCOSE, CAPILLARY
Glucose-Capillary: 77 mg/dL (ref 70–99)
Glucose-Capillary: 111 mg/dL — ABNORMAL HIGH (ref 70–99)

## 2013-06-17 NOTE — Addendum Note (Signed)
Addended by: MCCHESNEY, MARILYN K on: 06/17/2013 05:52 PM   Modules accepted: Orders  

## 2013-06-17 NOTE — Progress Notes (Signed)
Patient ID: [name], female   DOB: 1927-04-22, 80 y.o.   MRN: [id number]           PROGRESS NOTE  DATE:  06/03/2013    FACILITY: [facility]    LEVEL OF CARE:   SNF   Acute Visit   CHIEF COMPLAINT:  Cough.    HISTORY OF PRESENT ILLNESS:  This is a patient with known COPD, on chronic oxygen, who has been in the facility since July.    She has developed a harsh cough and was seen yesterday by our service.  She has had a chest x-ray which has only been done today, also lab work.  Her lab work has come back showing a white count of 6.5, essentially normal differential.  Hemoglobin is 11.5.  Her potassium is 2.9.     It appears that she did have a bout of pseudomembranous colitis in early December/late November.    REVIEW OF SYSTEMS:   CHEST/RESPIRATORY:  The patient really has no complaints.  She does not seem to be short of breath.  No cough.   CARDIAC:   She has no complaints of chest pain.   GI:  She is not complaining of diarrhea.    PHYSICAL EXAMINATION:   VITAL SIGNS:   O2 SATURATIONS:  97% on 2 L.   TEMPERATURE:  98.2.   PULSE:  75.   RESPIRATIONS:  18.   BLOOD PRESSURE:  92/61.   GENERAL APPEARANCE:  The patient does not look unwell.  She is in absolutely no distress.   CHEST/RESPIRATORY:   Shallow air entry, but no crackles or wheezes.   CARDIOVASCULAR:  CARDIAC:   Heart sounds are normal.  She does not appear to be dehydrated.     ASSESSMENT/PLAN:  COPD.  This actually seems quite stable.    Influenza A outbreak in the facility.  However, this patient does not really meet any of the criteria at this point.  Although she has a dry cough, she does not look to be in any distress.  She is not febrile.  I am going to go ahead and do a nasopharyngeal swab.  However, I will start her on full-dose Tamiflu because of the circumstances in the long-term care building where she resides.  A chest x-ray has been done.  We will follow up on that.    Hypokalemia.  She is  not on diuretics.  I am assuming that this may be a holdover of her recurrent pseudomembranous colitis which seems to have happened twice, once in early November and once in late November.  Fortunately, this appears to be stable.  I will simply replace this.    CPT CODE: 31540

## 2013-06-17 NOTE — Assessment & Plan Note (Signed)
This patient has a 5.7 cm juxtarenal abdominal aortic aneurysm. In addition the descending thoracic aorta is ectatic. She is not a candidate for EVAR. She would be extremely high risk for open repair of her aneurysm. Given her age and declining cognitive function, the family did not wish to consider surgery for her aneurysm and I think this is perfectly reasonable. She is not a smoker. I've arrange a follow up ultrasound in one year and I'll see her back at that time. The family is comfortable with that time interval.

## 2013-06-17 NOTE — Progress Notes (Signed)
[facility]  Patient name: [name] MRN: [id number] DOB: Mar 02, 1927 Sex: female  REASON FOR VISIT: Follow up of abdominal aortic aneurysm.  HPI: VENNESA Key is a 80 y.o. female who I last saw on 11/26/2012. At that time, her CT scan suggested a juxtarenal abdominal aortic aneurysm with significant tortuosity at the level of the renal arteries. Based on her CT scan she was not a candidate for EVAR. The maximum diameter at that time was 5.1 cm.   Since I saw her last she did fall in the right hip and has undergone a right hip replacement. She's had some cognitive decline since that time. She is here today with her daughter. She denies any significant abdominal pain. She does have some chronic back pain and has significant scoliosis.   Past Medical History  Diagnosis Date  . Hypertension   . Memory loss   . Anemia   . Chronic back pain   . Chronic kidney disease   . Anxiety   . Panic attack   . Chronic respiratory failure 04/30/2012  . COPD (chronic obstructive pulmonary disease)   . AAA (abdominal aortic aneurysm)   . Cancer     skin Left wrist BCC  . DVT (deep venous thrombosis)   . Aortic aneurysm, abdominal   . Back injury     has had broken back x 2  . Knee problem     Right knee pain  . Arm fracture, right   . Clostridium difficile enteritis 02/2013    treated   . PMB (postmenopausal bleeding) 03/12/2013   Family History  Problem Relation Age of Onset  . Other Mother   . AAA (abdominal aortic aneurysm) Mother   . Heart attack Mother   . Bladder Cancer Sister   . Breast cancer Sister   . Lung cancer Sister   . Colon cancer Brother   . Colon cancer Brother   . Hyperlipidemia Father   . Hypertension Father   . Heart disease Father     Heart Disease before age 23   SOCIAL HISTORY: History  Substance Use Topics  . Smoking status: Former Smoker -- 1.00 packs/day for 63 years    Types: Cigarettes    Quit date: 04/25/2012  .  Smokeless tobacco: Never Used     Comment: PATIENT QUIT AFTER FALL ON 04/2012  . Alcohol Use: No   Allergies  Allergen Reactions  . Biaxin [Clarithromycin]   . Erythromycin   . Keflex [Cephalexin]   . Macrodantin [Nitrofurantoin Macrocrystal]   . Penicillins   . Sulfa Antibiotics    Current Outpatient Prescriptions  Medication Sig Dispense Refill  . diazepam (VALIUM) 5 MG tablet Take 0.5 tablets (2.5 mg total) by mouth at bedtime as needed for sleep. Sleep  15 tablet  5  . traMADol (ULTRAM) 50 MG tablet Take one tablet by mouth every 6 hours as needed for pain  120 tablet  5   No current facility-administered medications for this visit.   REVIEW OF SYSTEMS: Valu.Nieves ] denotes positive finding; [  ] denotes negative finding  CARDIOVASCULAR:  [ ]  chest pain   [ ]  chest pressure   [ ]  palpitations   [ ]  orthopnea   [ ]  dyspnea on exertion   [ ]  claudication   [ ]  rest pain   [ ]  DVT   [ ]  phlebitis PULMONARY:   [ ]  productive cough   [ ]  asthma   [ ]   wheezing NEUROLOGIC:   [ ]  weakness  [ ]  paresthesias  [ ]  aphasia  [ ]  amaurosis  [ ]  dizziness HEMATOLOGIC:   [ ]  bleeding problems   [ ]  clotting disorders MUSCULOSKELETAL:  Valu.Nieves ] joint pain   [ ]  joint swelling [ ]  leg swelling GASTROINTESTINAL: [ ]   blood in stool  [ ]   hematemesis GENITOURINARY:  [ ]   dysuria  [ ]   hematuria PSYCHIATRIC:  [ ]  history of major depression INTEGUMENTARY:  [ ]  rashes  [ ]  ulcers CONSTITUTIONAL:  [ ]  fever   [ ]  chills  PHYSICAL EXAM: Filed Vitals:   06/17/13 1024  BP: 158/81  Pulse: 69  Height: 5\' 1"  (1.549 m)  Weight: 118 lb (53.524 kg)  SpO2: 100%   Body mass index is 22.31 kg/(m^2). GENERAL: The patient is an elderly  female, in no acute distress. The vital signs are documented above. CARDIOVASCULAR: There is a regular rate and rhythm. I do not detect carotid bruits. She has palpable femoral pulses. Both feet are warm and well perfused. She has no significant lower extremity swelling. PULMONARY:  There is good air exchange bilaterally without wheezing or rales. ABDOMEN: Soft and non-tender with normal pitched bowel sounds.  MUSCULOSKELETAL: There are no major deformities or cyanosis. NEUROLOGIC: No focal weakness or paresthesias are detected. SKIN: There are no ulcers or rashes noted. PSYCHIATRIC: The patient has a normal affect.  DATA:  I have independently interpreted her ultrasound of her aorta which shows that the maximum diameter is 5.75 cm.  MEDICAL ISSUES:  Abdominal aneurysm without mention of rupture This patient has a 5.7 cm juxtarenal abdominal aortic aneurysm. In addition the descending thoracic aorta is ectatic. She is not a candidate for EVAR. She would be extremely high risk for open repair of her aneurysm. Given her age and declining cognitive function, the family did not wish to consider surgery for her aneurysm and I think this is perfectly reasonable. She is not a smoker. I've arrange a follow up ultrasound in one year and I'll see her back at that time. The family is comfortable with that time interval.   Saloma Cadena S [facility]: [phone]

## 2013-06-18 LAB — GLUCOSE, CAPILLARY: Glucose-Capillary: 79 mg/dL (ref 70–99)

## 2013-06-19 LAB — GLUCOSE, CAPILLARY
Glucose-Capillary: 114 mg/dL — ABNORMAL HIGH (ref 70–99)
Glucose-Capillary: 72 mg/dL (ref 70–99)
Glucose-Capillary: 102 mg/dL — ABNORMAL HIGH (ref 70–99)

## 2013-06-20 LAB — GLUCOSE, CAPILLARY
Glucose-Capillary: 81 mg/dL (ref 70–99)
Glucose-Capillary: 83 mg/dL (ref 70–99)
Glucose-Capillary: 117 mg/dL — ABNORMAL HIGH (ref 70–99)

## 2013-06-21 LAB — GLUCOSE, CAPILLARY
Glucose-Capillary: 79 mg/dL (ref 70–99)
Glucose-Capillary: 129 mg/dL — ABNORMAL HIGH (ref 70–99)

## 2013-06-22 LAB — GLUCOSE, CAPILLARY
GLUCOSE-CAPILLARY: 118 mg/dL — AB (ref 70–99)
Glucose-Capillary: 85 mg/dL (ref 70–99)

## 2013-06-23 LAB — GLUCOSE, CAPILLARY
Glucose-Capillary: 82 mg/dL (ref 70–99)
Glucose-Capillary: 151 mg/dL — ABNORMAL HIGH (ref 70–99)

## 2013-06-24 LAB — GLUCOSE, CAPILLARY: Glucose-Capillary: 83 mg/dL (ref 70–99)

## 2013-06-25 LAB — GLUCOSE, CAPILLARY
GLUCOSE-CAPILLARY: 116 mg/dL — AB (ref 70–99)
GLUCOSE-CAPILLARY: 91 mg/dL (ref 70–99)

## 2013-06-26 LAB — GLUCOSE, CAPILLARY: GLUCOSE-CAPILLARY: 76 mg/dL (ref 70–99)

## 2013-06-27 LAB — GLUCOSE, CAPILLARY
Glucose-Capillary: 145 mg/dL — ABNORMAL HIGH (ref 70–99)
Glucose-Capillary: 67 mg/dL — ABNORMAL LOW (ref 70–99)
Glucose-Capillary: 102 mg/dL — ABNORMAL HIGH (ref 70–99)

## 2013-06-28 LAB — GLUCOSE, CAPILLARY: GLUCOSE-CAPILLARY: 76 mg/dL (ref 70–99)

## 2013-06-29 LAB — GLUCOSE, CAPILLARY
GLUCOSE-CAPILLARY: 111 mg/dL — AB (ref 70–99)
GLUCOSE-CAPILLARY: 94 mg/dL (ref 70–99)
Glucose-Capillary: 145 mg/dL — ABNORMAL HIGH (ref 70–99)

## 2013-06-30 LAB — GLUCOSE, CAPILLARY
Glucose-Capillary: 77 mg/dL (ref 70–99)
Glucose-Capillary: 79 mg/dL (ref 70–99)

## 2013-07-01 LAB — GLUCOSE, CAPILLARY
Glucose-Capillary: 66 mg/dL — ABNORMAL LOW (ref 70–99)
Glucose-Capillary: 87 mg/dL (ref 70–99)

## 2013-07-03 LAB — GLUCOSE, CAPILLARY
Glucose-Capillary: 97 mg/dL (ref 70–99)
Glucose-Capillary: 86 mg/dL (ref 70–99)

## 2013-07-04 LAB — GLUCOSE, CAPILLARY
GLUCOSE-CAPILLARY: 103 mg/dL — AB (ref 70–99)
Glucose-Capillary: 65 mg/dL — ABNORMAL LOW (ref 70–99)
Glucose-Capillary: 137 mg/dL — ABNORMAL HIGH (ref 70–99)
Glucose-Capillary: 139 mg/dL — ABNORMAL HIGH (ref 70–99)

## 2013-07-05 LAB — GLUCOSE, CAPILLARY
Glucose-Capillary: 77 mg/dL (ref 70–99)
Glucose-Capillary: 102 mg/dL — ABNORMAL HIGH (ref 70–99)

## 2013-07-06 LAB — GLUCOSE, CAPILLARY
Glucose-Capillary: 79 mg/dL (ref 70–99)
Glucose-Capillary: 120 mg/dL — ABNORMAL HIGH (ref 70–99)

## 2013-07-07 ENCOUNTER — Non-Acute Institutional Stay (SKILLED_NURSING_FACILITY): Payer: Medicare Other | Admitting: Internal Medicine

## 2013-07-07 ENCOUNTER — Encounter (HOSPITAL_COMMUNITY): Payer: Self-pay | Admitting: Emergency Medicine

## 2013-07-07 ENCOUNTER — Emergency Department (HOSPITAL_COMMUNITY)
Admission: EM | Admit: 2013-07-07 | Discharge: 2013-07-07 | Disposition: A | Discharge status: Home / Self Care | Payer: Medicare Other | Attending: Emergency Medicine | Admitting: Emergency Medicine

## 2013-07-07 ENCOUNTER — Inpatient Hospital Stay
Admission: RE | Admit: 2013-07-07 | Discharge: 2014-01-20 | Disposition: A | Discharge status: Nursing Home (Includes ICF) | Payer: Medicare Other | Source: Ambulatory Visit | Attending: Internal Medicine | Admitting: Internal Medicine

## 2013-07-07 ENCOUNTER — Emergency Department (HOSPITAL_COMMUNITY): Payer: Medicare Other

## 2013-07-07 DIAGNOSIS — J4489 Other specified chronic obstructive pulmonary disease: Secondary | ICD-10-CM | POA: Insufficient documentation

## 2013-07-07 DIAGNOSIS — F3289 Other specified depressive episodes: Secondary | ICD-10-CM | POA: Insufficient documentation

## 2013-07-07 DIAGNOSIS — I129 Hypertensive chronic kidney disease with stage 1 through stage 4 chronic kidney disease, or unspecified chronic kidney disease: Secondary | ICD-10-CM | POA: Insufficient documentation

## 2013-07-07 DIAGNOSIS — Z7982 Long term (current) use of aspirin: Secondary | ICD-10-CM | POA: Insufficient documentation

## 2013-07-07 DIAGNOSIS — R52 Pain, unspecified: Principal | ICD-10-CM

## 2013-07-07 DIAGNOSIS — Z85828 Personal history of other malignant neoplasm of skin: Secondary | ICD-10-CM | POA: Insufficient documentation

## 2013-07-07 DIAGNOSIS — J449 Chronic obstructive pulmonary disease, unspecified: Secondary | ICD-10-CM | POA: Insufficient documentation

## 2013-07-07 DIAGNOSIS — K449 Diaphragmatic hernia without obstruction or gangrene: Secondary | ICD-10-CM | POA: Insufficient documentation

## 2013-07-07 DIAGNOSIS — Z87828 Personal history of other (healed) physical injury and trauma: Secondary | ICD-10-CM | POA: Insufficient documentation

## 2013-07-07 DIAGNOSIS — F0390 Unspecified dementia without behavioral disturbance: Secondary | ICD-10-CM | POA: Insufficient documentation

## 2013-07-07 DIAGNOSIS — R197 Diarrhea, unspecified: Secondary | ICD-10-CM | POA: Insufficient documentation

## 2013-07-07 DIAGNOSIS — F329 Major depressive disorder, single episode, unspecified: Secondary | ICD-10-CM | POA: Insufficient documentation

## 2013-07-07 DIAGNOSIS — G8929 Other chronic pain: Secondary | ICD-10-CM | POA: Insufficient documentation

## 2013-07-07 DIAGNOSIS — R109 Unspecified abdominal pain: Secondary | ICD-10-CM

## 2013-07-07 DIAGNOSIS — F411 Generalized anxiety disorder: Secondary | ICD-10-CM | POA: Insufficient documentation

## 2013-07-07 DIAGNOSIS — Z87891 Personal history of nicotine dependence: Secondary | ICD-10-CM | POA: Insufficient documentation

## 2013-07-07 DIAGNOSIS — Z8742 Personal history of other diseases of the female genital tract: Secondary | ICD-10-CM | POA: Insufficient documentation

## 2013-07-07 DIAGNOSIS — Z880 Allergy status to penicillin: Secondary | ICD-10-CM | POA: Insufficient documentation

## 2013-07-07 DIAGNOSIS — R112 Nausea with vomiting, unspecified: Secondary | ICD-10-CM | POA: Insufficient documentation

## 2013-07-07 DIAGNOSIS — Z79899 Other long term (current) drug therapy: Secondary | ICD-10-CM | POA: Insufficient documentation

## 2013-07-07 DIAGNOSIS — Z9889 Other specified postprocedural states: Secondary | ICD-10-CM | POA: Insufficient documentation

## 2013-07-07 DIAGNOSIS — Z86718 Personal history of other venous thrombosis and embolism: Secondary | ICD-10-CM | POA: Insufficient documentation

## 2013-07-07 DIAGNOSIS — N189 Chronic kidney disease, unspecified: Secondary | ICD-10-CM | POA: Insufficient documentation

## 2013-07-07 DIAGNOSIS — IMO0002 Reserved for concepts with insufficient information to code with codable children: Secondary | ICD-10-CM | POA: Insufficient documentation

## 2013-07-07 DIAGNOSIS — D649 Anemia, unspecified: Secondary | ICD-10-CM | POA: Insufficient documentation

## 2013-07-07 DIAGNOSIS — R1110 Vomiting, unspecified: Secondary | ICD-10-CM

## 2013-07-07 DIAGNOSIS — Z8619 Personal history of other infectious and parasitic diseases: Secondary | ICD-10-CM | POA: Insufficient documentation

## 2013-07-07 LAB — COMPREHENSIVE METABOLIC PANEL
ALBUMIN: 2.7 g/dL — AB (ref 3.5–5.2)
ALT: 10 U/L (ref 0–35)
AST: 20 U/L (ref 0–37)
Alkaline Phosphatase: 139 U/L — ABNORMAL HIGH (ref 39–117)
BUN: 17 mg/dL (ref 6–23)
CALCIUM: 9.6 mg/dL (ref 8.4–10.5)
CO2: 28 mEq/L (ref 19–32)
Chloride: 95 mEq/L — ABNORMAL LOW (ref 96–112)
Creatinine, Ser: 1.18 mg/dL — ABNORMAL HIGH (ref 0.50–1.10)
GFR calc Af Amer: 48 mL/min — ABNORMAL LOW (ref >90)
GFR calc non Af Amer: 41 mL/min — ABNORMAL LOW (ref >90)
GLUCOSE: 212 mg/dL — AB (ref 70–99)
Potassium: 4.6 mEq/L (ref 3.7–5.3)
SODIUM: 135 meq/L — AB (ref 137–147)
TOTAL PROTEIN: 7.5 g/dL (ref 6.0–8.3)
Total Bilirubin: 0.6 mg/dL (ref 0.3–1.2)

## 2013-07-07 LAB — URINE MICROSCOPIC-ADD ON

## 2013-07-07 LAB — URINALYSIS, ROUTINE W REFLEX MICROSCOPIC
Bilirubin Urine: NEGATIVE
Glucose, UA: 250 mg/dL — AB
Hgb urine dipstick: NEGATIVE
Ketones, ur: NEGATIVE mg/dL
LEUKOCYTES UA: NEGATIVE
Nitrite: NEGATIVE
PH: 6 (ref 5.0–8.0)
Protein, ur: 30 mg/dL — AB
SPECIFIC GRAVITY, URINE: 1.025 (ref 1.005–1.030)
Urobilinogen, UA: 0.2 mg/dL (ref 0.0–1.0)

## 2013-07-07 LAB — GLUCOSE, CAPILLARY: Glucose-Capillary: 79 mg/dL (ref 70–99)

## 2013-07-07 LAB — CBC WITH DIFFERENTIAL/PLATELET
BASOS PCT: 0 % (ref 0–1)
Basophils Absolute: 0 10*3/uL (ref 0.0–0.1)
EOS ABS: 0 10*3/uL (ref 0.0–0.7)
EOS PCT: 0 % (ref 0–5)
HEMATOCRIT: 40.8 % (ref 36.0–46.0)
Hemoglobin: 13.5 g/dL (ref 12.0–15.0)
LYMPHS ABS: 0.9 10*3/uL (ref 0.7–4.0)
Lymphocytes Relative: 10 % — ABNORMAL LOW (ref 12–46)
MCH: 29.9 pg (ref 26.0–34.0)
MCHC: 33.1 g/dL (ref 30.0–36.0)
MCV: 90.5 fL (ref 78.0–100.0)
Monocytes Absolute: 1 10*3/uL (ref 0.1–1.0)
Monocytes Relative: 11 % (ref 3–12)
NEUTROS ABS: 7 10*3/uL (ref 1.7–7.7)
NEUTROS PCT: 78 % — AB (ref 43–77)
Platelets: 199 10*3/uL (ref 150–400)
RBC: 4.51 MIL/uL (ref 3.87–5.11)
RDW: 14.4 % (ref 11.5–15.5)
WBC: 9 10*3/uL (ref 4.0–10.5)

## 2013-07-07 MED ORDER — SUCRALFATE 1 GM/10ML PO SUSP: 1.0000 g | Freq: Three times a day (TID) | ORAL | Status: DC

## 2013-07-07 NOTE — Discharge Instructions (Signed)
Her labs are stable from prior labs. Her CT scan today shows a large hiatal hernia and a stable aortic aneuysm, it is about 5.8 cm in size tonight and on the ultrasound she had at Dr Evelena Leyden office on Jan 7th. Her family reports immediate pain when she eats of drinks. Give her the carafate suspension about 30 minutes before her meals and bedtime to see if that will coat any inflammation of her esophagus that she may have from her hiatal hernia. Hopefully this will help her pain. Please discuss her family's concerns about her not gaining weight with her medical doctor.

## 2013-07-07 NOTE — ED Provider Notes (Signed)
CSN: [id number]     Arrival date & time 07/07/13  1402 History   First MD Initiated Contact with Patient 07/07/13 1425     Chief Complaint  Patient presents with  . Abdominal Pain   Level V caveat for dementia  (Consider location/radiation/quality/duration/timing/severity/associated sxs/prior Treatment) HPI Daughter in law and son are here with patient. They state patient has a abdominal aortic aneurysm. Looking at her records she was last evaluated by vascular surgery on January 7. 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 Kellie Simmering did not feel was amenable to grafting. Family and he also did not feel patient would tolerate open repair. His next scheduled appointment with her is in one year. Family reports she has had intermittent problems with C. difficile. When they saw her yesterday she was grimacing as if painful and wouldn't eat. She was eating today but grimaced each time. They state she complains of nausea however she does not have vomiting. They state she has diarrhea she states she doesn't. They report today she was doubled over in pain and had a bowel movement so the nursing home doctor sent her to the ED.   PCP Granville Lewis  Past Medical History  Diagnosis Date  . Hypertension   . Memory loss   . Anemia   . Chronic back pain   . Chronic kidney disease   . Anxiety   . Panic attack   . Chronic respiratory failure 04/30/2012  . COPD (chronic obstructive pulmonary disease)   . AAA (abdominal aortic aneurysm)   . Cancer     skin Left wrist BCC  . DVT (deep venous thrombosis)   . Aortic aneurysm, abdominal   . Back injury     has had broken back x 2  . Knee problem     Right knee pain  . Arm fracture, right   . Clostridium difficile enteritis 02/2013    treated   . PMB (postmenopausal bleeding) 03/12/2013   Past Surgical History  Procedure Laterality Date  . Hemorrhoid surgery    . Hip arthroplasty Right 01/08/2013    Procedure: ARTHROPLASTY BIPOLAR HIP;   Surgeon: Nita Sells, MD;  Location: [facility];  Service: Orthopedics;  Laterality: Right;   Family History  Problem Relation Age of Onset  . Other Mother   . AAA (abdominal aortic aneurysm) Mother   . Heart attack Mother   . Bladder Cancer Sister   . Breast cancer Sister   . Lung cancer Sister   . Colon cancer Brother   . Colon cancer Brother   . Hyperlipidemia Father   . Hypertension Father   . Heart disease Father     Heart Disease before age 110   History  Substance Use Topics  . Smoking status: Former Smoker -- 1.00 packs/day for 63 years    Types: Cigarettes    Quit date: 04/25/2012  . Smokeless tobacco: Never Used     Comment: PATIENT QUIT AFTER FALL ON 04/2012  . Alcohol Use: No  lives in [place] since September   OB History   Grav Para Term Preterm Abortions TAB SAB Ect Mult Living   5 5        3      Review of Systems  Unable to perform ROS: Dementia    Allergies  Biaxin; Erythromycin; Keflex; Macrodantin; Penicillins; and Sulfa antibiotics  Home Medications   Current Outpatient Rx  Name  Route  Sig  Dispense  Refill  .  acetaminophen (TYLENOL) 500 MG tablet   Oral   Take 500 mg by mouth every 6 (six) hours as needed for pain.         Marland Kitchen aspirin EC 325 MG EC tablet   Oral   Take 1 tablet (325 mg total) by mouth 2 (two) times daily.   60 tablet   0   . budesonide (PULMICORT) 0.25 MG/2ML nebulizer solution   Nebulization   Take 2 mLs (0.25 mg total) by nebulization 2 (two) times daily.   60 mL   12   . cyanocobalamin (,VITAMIN B-12,) 1000 MCG/ML injection   Intramuscular   Inject 1,000 mcg into the muscle every 30 (thirty) days.         . diazepam (VALIUM) 5 MG tablet   Oral   Take 0.5 tablets (2.5 mg total) by mouth at bedtime as needed for sleep. Sleep   15 tablet   5   . donepezil (ARICEPT) 5 MG tablet   Oral   Take 5 mg by mouth at bedtime.          Marland Kitchen escitalopram (LEXAPRO) 5 MG tablet   Oral   Take 5 mg by mouth daily.          . feeding supplement (ENSURE COMPLETE) LIQD   Oral   Take 237 mLs by mouth 2 (two) times daily between meals.         . ferrous sulfate 325 (65 FE) MG tablet   Oral   Take 325 mg by mouth daily with breakfast.         . folic acid (FOLVITE) 1 MG tablet   Oral   Take 1 mg by mouth daily.         . Multiple Vitamin (MULTIVITAMIN WITH MINERALS) TABS   Oral   Take 1 tablet by mouth daily.         . QUEtiapine (SEROQUEL) 25 MG tablet   Oral   Take 25 mg by mouth 2 (two) times daily.         . traMADol (ULTRAM) 50 MG tablet      Take one tablet by mouth every 6 hours as needed for pain   120 tablet   5   . Vitamin D, Ergocalciferol, (DRISDOL) 50000 UNITS CAPS   Oral   Take 50,000 Units by mouth every 7 (seven) days. Takes on Mondays          BP 131/72  Pulse 72  Temp(Src) 97.8 F (36.6 C) (Oral)  Resp 20  SpO2 100%   Vital signs normal    Physical Exam  Nursing note and vitals reviewed. Constitutional:  Non-toxic appearance. She does not appear ill. No distress.  Frail elderly female who is awake and cooperative, does not appear to be in distress. Initial interview started while still on her transport stretcher.   HENT:  Head: Normocephalic and atraumatic.  Right Ear: External ear normal.  Left Ear: External ear normal.  Nose: Nose normal. No mucosal edema or rhinorrhea.  Mouth/Throat: Oropharynx is clear and moist and mucous membranes are normal. No dental abscesses or uvula swelling.  Tongue is moist  Eyes: Conjunctivae and EOM are normal. Pupils are equal, round, and reactive to light.  Conjunctiva pale  Neck: Normal range of motion and full passive range of motion without pain. Neck supple.  Cardiovascular: Normal rate, regular rhythm and normal heart sounds.  Exam reveals no gallop and no friction rub.   No murmur heard. Pulmonary/Chest:  Effort normal and breath sounds normal. No respiratory distress. She has no wheezes. She has no  rhonchi. She has no rales. She exhibits no tenderness and no crepitus.  Abdominal: Soft. Normal appearance and bowel sounds are normal. She exhibits no distension. There is no tenderness. There is no rebound and no guarding.  No pulsatile mass  Musculoskeletal: Normal range of motion. She exhibits no edema and no tenderness.  Moves all extremities well.   Neurological: She is alert. She has normal strength. No cranial nerve deficit.  Awake cooperative some confusion  Skin: Skin is warm, dry and intact. No rash noted. No erythema. No pallor.  Psychiatric: She has a normal mood and affect. Her speech is normal and behavior is normal. Her mood appears not anxious.    ED Course  Procedures (including critical care time)  17:00 Pt resting quietly in no distress, awakened states she feels fine, waiting for AP CT results. Sitter outside room states she has been quiet.   Pt has had no vomiting or diarrhea while in the ED, she has been quietly laying on her stretcher. Family given results of tests which are stable. They are concerned about her pain when she eats of drinks, will try carafate, she may have esophagitis b/o her hiatal hernia. This can be managed at her nursing facility.  Her labs are not consistent with dehydration.   Labs Review Results for orders placed during the hospital encounter of 07/07/13  CBC WITH DIFFERENTIAL      Result Value Range   WBC 9.0  4.0 - 10.5 K/uL   RBC 4.51  3.87 - 5.11 MIL/uL   Hemoglobin 13.5  12.0 - 15.0 g/dL   HCT 40.8  36.0 - 46.0 %   MCV 90.5  78.0 - 100.0 fL   MCH 29.9  26.0 - 34.0 pg   MCHC 33.1  30.0 - 36.0 g/dL   RDW 14.4  11.5 - 15.5 %   Platelets 199  150 - 400 K/uL   Neutrophils Relative % 78 (*) 43 - 77 %   Neutro Abs 7.0  1.7 - 7.7 K/uL   Lymphocytes Relative 10 (*) 12 - 46 %   Lymphs Abs 0.9  0.7 - 4.0 K/uL   Monocytes Relative 11  3 - 12 %   Monocytes Absolute 1.0  0.1 - 1.0 K/uL   Eosinophils Relative 0  0 - 5 %   Eosinophils Absolute  0.0  0.0 - 0.7 K/uL   Basophils Relative 0  0 - 1 %   Basophils Absolute 0.0  0.0 - 0.1 K/uL  COMPREHENSIVE METABOLIC PANEL      Result Value Range   Sodium 135 (*) 137 - 147 mEq/L   Potassium 4.6  3.7 - 5.3 mEq/L   Chloride 95 (*) 96 - 112 mEq/L   CO2 28  19 - 32 mEq/L   Glucose, Bld 212 (*) 70 - 99 mg/dL   BUN 17  6 - 23 mg/dL   Creatinine, Ser 1.18 (*) 0.50 - 1.10 mg/dL   Calcium 9.6  8.4 - 10.5 mg/dL   Total Protein 7.5  6.0 - 8.3 g/dL   Albumin 2.7 (*) 3.5 - 5.2 g/dL   AST 20  0 - 37 U/L   ALT 10  0 - 35 U/L   Alkaline Phosphatase 139 (*) 39 - 117 U/L   Total Bilirubin 0.6  0.3 - 1.2 mg/dL   GFR calc non Af Amer 41 (*) >90 mL/min  GFR calc Af Amer 48 (*) >90 mL/min  URINALYSIS, ROUTINE W REFLEX MICROSCOPIC      Result Value Range   Color, Urine YELLOW  YELLOW   APPearance CLEAR  CLEAR   Specific Gravity, Urine 1.025  1.005 - 1.030   pH 6.0  5.0 - 8.0   Glucose, UA 250 (*) NEGATIVE mg/dL   Hgb urine dipstick NEGATIVE  NEGATIVE   Bilirubin Urine NEGATIVE  NEGATIVE   Ketones, ur NEGATIVE  NEGATIVE mg/dL   Protein, ur 30 (*) NEGATIVE mg/dL   Urobilinogen, UA 0.2  0.0 - 1.0 mg/dL   Nitrite NEGATIVE  NEGATIVE   Leukocytes, UA NEGATIVE  NEGATIVE  URINE MICROSCOPIC-ADD ON      Result Value Range   Squamous Epithelial / LPF RARE  RARE   WBC, UA 0-2  <3 WBC/hpf   Bacteria, UA RARE  RARE   Casts HYALINE CASTS (*) NEGATIVE   Urine-Other AMORPHOUS URATES/PHOSPHATES     Laboratory interpretation all normal except hyperglycemia, mild renal insuffic   Imaging Review Ct Abdomen Pelvis Wo Contrast  07/07/2013   CLINICAL DATA:  Worsening abdominal pain, known aortic aneurysm  EXAM: CT ABDOMEN AND PELVIS WITHOUT CONTRAST  TECHNIQUE: Multidetector CT imaging of the abdomen and pelvis was performed following the standard protocol without intravenous contrast.  COMPARISON:  11/26/2012  FINDINGS: Sagittal images of the spine shows chronic compression deformities T12-L1 and L2 vertebral  body. Study is limited without IV contrast. Large diaphragmatic hernia with at least half of the stomach in retrocardiac position again noted. Again noted some mass effect on posterior heart wall. Again noted abdominal aortic aneurysm measures 5.8 x 6.2 cm with progression in size from prior exam when measured 5.2 x 5.1 cm. Again noted atherosclerotic calcifications of aortic wall. Unenhanced liver shows no biliary ductal dilatation. No calcified gallstones are noted within gallbladder. Unenhanced pancreas, spleen and adrenals are unremarkable. Unenhanced kidneys are symmetrical in size. Left renal cyst is stable. No nephrolithiasis. No hydronephrosis or hydroureter. No definite evidence of aneurysmal leak on this unenhanced scan.  No small bowel obstruction. No ascites or free air. No adenopathy. Limited assessment of the pelvis due to extensive metallic artifacts from right hip prosthesis. No destructive bony lesions are noted within pelvis. Degenerative changes bilateral SI joints.  IMPRESSION: 1. Again noted large diaphragmatic hernia with retrocardiac position of proximal stomach and mass effect on posterior or heart wall. 2. There is progression in size of abdominal aortic aneurysm measures 5.8 x 6.2 cm. On the prior exam measures 5.2 x 5.1 cm. No definite evidence of leak on this unenhanced scan. 3. No hydronephrosis or hydroureter. 4. Stable compression deformities lower thoracic and lumbar spine. 5. Limited examination of the pelvis due to extensive metallic artifacts from right hip prosthesis.   Electronically Signed   By: Lahoma Crocker M.D.   On: 07/07/2013 17:07    EKG Interpretation   None       MDM   1. Vomiting and diarrhea   2. Hiatal hernia     New Prescriptions   SUCRALFATE (CARAFATE) 1 GM/10ML SUSPENSION    Take 10 mLs (1 g total) by mouth 4 (four) times daily -  with meals and at bedtime.    Plan discharge   Rolland Porter, MD, Alanson Aly, MD 07/07/13 [phone]

## 2013-07-07 NOTE — ED Notes (Signed)
Resident at [facility] - sent here for worsening abd pain.  Hx of AAA, recent US shows enlargement.  C/o nausea, denies vomiting.

## 2013-07-08 LAB — GLUCOSE, CAPILLARY
Glucose-Capillary: 81 mg/dL (ref 70–99)
Glucose-Capillary: 165 mg/dL — ABNORMAL HIGH (ref 70–99)

## 2013-07-09 ENCOUNTER — Encounter: Payer: Self-pay | Admitting: Internal Medicine

## 2013-07-09 DIAGNOSIS — R109 Unspecified abdominal pain: Secondary | ICD-10-CM | POA: Insufficient documentation

## 2013-07-09 LAB — GLUCOSE, CAPILLARY
GLUCOSE-CAPILLARY: 106 mg/dL — AB (ref 70–99)
Glucose-Capillary: 72 mg/dL (ref 70–99)

## 2013-07-09 NOTE — Progress Notes (Signed)
Patient ID: [name], female   DOB: [birth year], 80 y.o.   MRN: [id number]   This is an acute visit.  Level of care skilled.  Facility [facility].  The date is 07/07/2013  Chief complaint-acute visit secondary to abdominal pain.  History of present illness.  Patient is a pleasant elderly resident with a complex medical history including recurrent C. difficile she has received Flagyl and subsequently vancomycin for this --also has a history of a right hip fracture which is the original reason for her admission here.  Also has a history of COPD.  Apparently yesterday she had some diarrhea again-she is complaining of fairly acute abdominal pain this afternoon.  Apparently this started fairly suddenly her family is concerned as well.  Her vital signs are stable.  Of note she does have a history of an abdominal aortic aneurysm this has been followed by vascular and family has opted at this point for conservative treatment apparently it measures somewhat over 5 cm in diameter last reading I see is 5.7--this is from a visit to the  vascularphysician earlier this month.  Currently she is not complaining of any shortness of breath or chest pain but does complain of abdominal discomfort and nausea.  There has been no vomiting.  Her vital signs are stable.  Family medical social history as been reviewed per progress note on 01/13/2013.  Medications have been reviewed per MAR.  Review of systems somewhat limited secondary to dementia.  Gen. no fever chills noted.- appetite has been declining here recently according to family and nursing staff-  Respiratory has a history of COPD but does not complaining of shortness of breath or cough.  Cardiac no chest pain.  GI-acute appearing abdominal discomfort and nausea no vomiting there has been apparently some diarrhea although this is somewhat sketchy.  Muscle skeletal does not complaining of joint pain she ambulates in a  wheelchair.  Neurologic she is not complaining of dizziness or headache at this time.  Physical exam.  Temperature is 97.9 pulse 72 respirations 20 blood pressure 127/74.  In general this is a frail elderly female who appears to be somewhat uncomfortable in moderate distress.  Her skin is warm and dry.  Heart is regular rate and rhythm with a 2/6 systolic murmur.  Chest is clear to auscultation somewhat poor respiratory effort.  Abdomen is soft there are active bowel sounds some diffuse tenderness to palpation with significant grimacing.  Muscle skeletal is at baseline I do not really note significant lower extremity edema.  Neurologic-appears grossly intact no lateralizing findings.  Psych she is oriented to self he is able to talk appears uncomfortable however.  Labs.  06/10/2013.  CBC 10.4 hemoglobin 12.3 platelets 263.  Sodium 138 potassium 4.0 BUN 13 creatinine 0.96.  Assessment and plan.  #1-abdominal pain of unknown etiology-there could be several factors at play here certainly her history of aortic aneurysm over 5 cm is concerning-she also has a history of recurrent C. difficile --.  Secondary to her fairly acute abdominal pain Will send to the ER for emergent evaluation--during my evaluation family did arrive and they are in agreement with this this was discussed with them at bedside.  [id number]

## 2013-07-10 LAB — GLUCOSE, CAPILLARY
Glucose-Capillary: 78 mg/dL (ref 70–99)
Glucose-Capillary: 98 mg/dL (ref 70–99)

## 2013-07-11 LAB — GLUCOSE, CAPILLARY: GLUCOSE-CAPILLARY: 68 mg/dL — AB (ref 70–99)

## 2013-07-12 LAB — GLUCOSE, CAPILLARY
GLUCOSE-CAPILLARY: 144 mg/dL — AB (ref 70–99)
GLUCOSE-CAPILLARY: 79 mg/dL (ref 70–99)
GLUCOSE-CAPILLARY: 111 mg/dL — AB (ref 70–99)

## 2013-07-13 LAB — GLUCOSE, CAPILLARY
GLUCOSE-CAPILLARY: 70 mg/dL (ref 70–99)
GLUCOSE-CAPILLARY: 176 mg/dL — AB (ref 70–99)
Glucose-Capillary: 110 mg/dL — ABNORMAL HIGH (ref 70–99)
Glucose-Capillary: 158 mg/dL — ABNORMAL HIGH (ref 70–99)

## 2013-07-14 LAB — GLUCOSE, CAPILLARY
GLUCOSE-CAPILLARY: 103 mg/dL — AB (ref 70–99)
Glucose-Capillary: 68 mg/dL — ABNORMAL LOW (ref 70–99)
Glucose-Capillary: 102 mg/dL — ABNORMAL HIGH (ref 70–99)

## 2013-07-15 LAB — GLUCOSE, CAPILLARY: Glucose-Capillary: 109 mg/dL — ABNORMAL HIGH (ref 70–99)

## 2013-07-16 LAB — GLUCOSE, CAPILLARY
GLUCOSE-CAPILLARY: 81 mg/dL (ref 70–99)
GLUCOSE-CAPILLARY: 121 mg/dL — AB (ref 70–99)
Glucose-Capillary: 129 mg/dL — ABNORMAL HIGH (ref 70–99)

## 2013-07-17 LAB — GLUCOSE, CAPILLARY
GLUCOSE-CAPILLARY: 105 mg/dL — AB (ref 70–99)
GLUCOSE-CAPILLARY: 105 mg/dL — AB (ref 70–99)
Glucose-Capillary: 61 mg/dL — ABNORMAL LOW (ref 70–99)

## 2013-07-18 LAB — GLUCOSE, CAPILLARY
GLUCOSE-CAPILLARY: 120 mg/dL — AB (ref 70–99)
Glucose-Capillary: 77 mg/dL (ref 70–99)

## 2013-07-19 LAB — GLUCOSE, CAPILLARY
GLUCOSE-CAPILLARY: 80 mg/dL (ref 70–99)
Glucose-Capillary: 98 mg/dL (ref 70–99)

## 2013-07-20 LAB — GLUCOSE, CAPILLARY
Glucose-Capillary: 78 mg/dL (ref 70–99)
Glucose-Capillary: 96 mg/dL (ref 70–99)

## 2013-07-21 LAB — GLUCOSE, CAPILLARY
GLUCOSE-CAPILLARY: 77 mg/dL (ref 70–99)
GLUCOSE-CAPILLARY: 239 mg/dL — AB (ref 70–99)

## 2013-07-22 LAB — GLUCOSE, CAPILLARY
GLUCOSE-CAPILLARY: 131 mg/dL — AB (ref 70–99)
Glucose-Capillary: 83 mg/dL (ref 70–99)

## 2013-07-23 LAB — GLUCOSE, CAPILLARY
Glucose-Capillary: 83 mg/dL (ref 70–99)
Glucose-Capillary: 89 mg/dL (ref 70–99)

## 2013-07-24 LAB — GLUCOSE, CAPILLARY
GLUCOSE-CAPILLARY: 184 mg/dL — AB (ref 70–99)
Glucose-Capillary: 165 mg/dL — ABNORMAL HIGH (ref 70–99)

## 2013-07-25 LAB — GLUCOSE, CAPILLARY
GLUCOSE-CAPILLARY: 73 mg/dL (ref 70–99)
Glucose-Capillary: 123 mg/dL — ABNORMAL HIGH (ref 70–99)

## 2013-07-26 LAB — GLUCOSE, CAPILLARY
Glucose-Capillary: 84 mg/dL (ref 70–99)
Glucose-Capillary: 209 mg/dL — ABNORMAL HIGH (ref 70–99)

## 2013-07-27 LAB — GLUCOSE, CAPILLARY
Glucose-Capillary: 76 mg/dL (ref 70–99)
Glucose-Capillary: 140 mg/dL — ABNORMAL HIGH (ref 70–99)

## 2013-07-28 LAB — GLUCOSE, CAPILLARY
GLUCOSE-CAPILLARY: 120 mg/dL — AB (ref 70–99)
Glucose-Capillary: 83 mg/dL (ref 70–99)

## 2013-07-29 LAB — GLUCOSE, CAPILLARY
Glucose-Capillary: 73 mg/dL (ref 70–99)
Glucose-Capillary: 100 mg/dL — ABNORMAL HIGH (ref 70–99)

## 2013-07-30 LAB — GLUCOSE, CAPILLARY
GLUCOSE-CAPILLARY: 174 mg/dL — AB (ref 70–99)
Glucose-Capillary: 78 mg/dL (ref 70–99)

## 2013-07-31 LAB — GLUCOSE, CAPILLARY
GLUCOSE-CAPILLARY: 109 mg/dL — AB (ref 70–99)
GLUCOSE-CAPILLARY: 112 mg/dL — AB (ref 70–99)

## 2013-08-01 LAB — GLUCOSE, CAPILLARY
Glucose-Capillary: 56 mg/dL — ABNORMAL LOW (ref 70–99)
Glucose-Capillary: 86 mg/dL (ref 70–99)
Glucose-Capillary: 110 mg/dL — ABNORMAL HIGH (ref 70–99)

## 2013-08-02 LAB — GLUCOSE, CAPILLARY
GLUCOSE-CAPILLARY: 94 mg/dL (ref 70–99)
Glucose-Capillary: 114 mg/dL — ABNORMAL HIGH (ref 70–99)

## 2013-08-03 LAB — GLUCOSE, CAPILLARY
Glucose-Capillary: 75 mg/dL (ref 70–99)
Glucose-Capillary: 123 mg/dL — ABNORMAL HIGH (ref 70–99)

## 2013-08-04 LAB — GLUCOSE, CAPILLARY: Glucose-Capillary: 71 mg/dL (ref 70–99)

## 2013-08-05 LAB — GLUCOSE, CAPILLARY: Glucose-Capillary: 89 mg/dL (ref 70–99)

## 2013-08-07 LAB — GLUCOSE, CAPILLARY: Glucose-Capillary: 95 mg/dL (ref 70–99)

## 2013-08-07 NOTE — Progress Notes (Signed)
This encounter was created in error - please disregard.  This encounter was created in error - please disregard.

## 2013-08-08 LAB — GLUCOSE, CAPILLARY
GLUCOSE-CAPILLARY: 84 mg/dL (ref 70–99)
GLUCOSE-CAPILLARY: 91 mg/dL (ref 70–99)

## 2013-08-09 LAB — GLUCOSE, CAPILLARY: Glucose-Capillary: 83 mg/dL (ref 70–99)

## 2013-08-16 LAB — GLUCOSE, CAPILLARY: Glucose-Capillary: 77 mg/dL (ref 70–99)

## 2013-08-21 ENCOUNTER — Non-Acute Institutional Stay (SKILLED_NURSING_FACILITY): Payer: Medicare Other | Admitting: Internal Medicine

## 2013-08-21 ENCOUNTER — Encounter: Payer: Self-pay | Admitting: Internal Medicine

## 2013-08-21 DIAGNOSIS — R109 Unspecified abdominal pain: Secondary | ICD-10-CM

## 2013-08-21 DIAGNOSIS — N950 Postmenopausal bleeding: Secondary | ICD-10-CM

## 2013-08-21 DIAGNOSIS — N390 Urinary tract infection, site not specified: Secondary | ICD-10-CM | POA: Insufficient documentation

## 2013-08-21 DIAGNOSIS — D638 Anemia in other chronic diseases classified elsewhere: Secondary | ICD-10-CM

## 2013-08-21 DIAGNOSIS — S72001A Fracture of unspecified part of neck of right femur, initial encounter for closed fracture: Secondary | ICD-10-CM

## 2013-08-21 DIAGNOSIS — F0391 Unspecified dementia with behavioral disturbance: Secondary | ICD-10-CM

## 2013-08-21 DIAGNOSIS — A0472 Enterocolitis due to Clostridium difficile, not specified as recurrent: Secondary | ICD-10-CM

## 2013-08-21 DIAGNOSIS — S72009A Fracture of unspecified part of neck of unspecified femur, initial encounter for closed fracture: Secondary | ICD-10-CM

## 2013-08-21 DIAGNOSIS — J438 Other emphysema: Secondary | ICD-10-CM

## 2013-08-21 DIAGNOSIS — J439 Emphysema, unspecified: Secondary | ICD-10-CM

## 2013-08-21 DIAGNOSIS — I10 Essential (primary) hypertension: Secondary | ICD-10-CM

## 2013-08-21 DIAGNOSIS — F03918 Unspecified dementia, unspecified severity, with other behavioral disturbance: Secondary | ICD-10-CM

## 2013-08-21 LAB — GLUCOSE, CAPILLARY: Glucose-Capillary: 82 mg/dL (ref 70–99)

## 2013-08-21 NOTE — Progress Notes (Signed)
Patient ID: [name], female   DOB: 06/28/1926, 80 y.o.   MRN: [id number]   Facility; [facility]  This is a routine visit.   Chief complaint;--medical management of right hip fracture status post repair-COPD-renal insufficiency-dementia-acute visit secondary to? UTI   History; this is an 80 year old woman who came to [place] after suffering a right hip fracture she underwent a right hip hemiarthroplasty. She has a history of COPD on chronic oxygen at 2 L for the last year. Other issues include dementia, tricuspid regurgitation, and chronic renal failure stage II .  Clinically nursing staff says she continues to do well- at first there was some increased confusion and very poor appetite but this improved significantly-however this has been somewhat complicated with a history of recurrent C. difficile infection he is completing a prolonged course of vancomycin for recurrent C. difficile apparently diarrhea has resolved.  Apparently earlier this week she had some increased agitation and confusion and a urinalysis and culture were obtained which is growing out greater than 100,000 colonies of Escherichia coli-however apparently this confusion and agitation appears to have resolved patient is not complaining of any dysuria she has been afebrile does not complaining of any back pain appears to be at her baseline today.  The antibiotics effective against this Escherichia coli unfortunately are largely listed as allergies for the patient-at this point will continue to monitor she does not appear to be symptomatic and with her history of C. difficile would be hesitant to be real aggressive with antibiotic for an asymptomatic infection--- and this was discussed with Dr. Dellia Nims via phone      She also has had a history of vaginal bleeding this has been evaluated by GYN and they have determined at this point no further followup certainly follow up as needed--there has been no reoccurrence to my knowledge.    She  was sent to the ER about 6 weeks ago with complaints of acute abdominal pain-however workup there did not show any acute process the scan did show a large hiatal hernia and a stable aortic aneurysm-she was recommendation for Carafate before meals to help with the abdominal discomfort and this appears to have helped  She does have a history of COPD this has been stable for some time and has not been an issue for some time she is on albuterol nebulizers as needed.  Also has a history of anemia she is on iron we will need to update her CBC last hemoglobin I see in chart was 12.3 in late December.       -.  Family medical social history reviewed per history and physical on 01/12/2013.  Medications have been reviewed per MAR.  Marland Kitchen  Review of systems; somewhat limited secondary to dementia but appears to be fairly accurate historian.  General denies any fever or chills  Eyes-she has prescription lenses does not complaining of visual changes.  Ears nose mouth and throat-he is somewhat hard of hearing does not complaining of any sore throat.  Respiratory no cough or excess short of breath --history of COPD Cardiac no chest pain  GI-as stated above does not complaining of any abdominal pain nausea or vomiting--diarrhea apparently has resolved  GU-is not complaining of any dysuria despite culture showing greater than 100,000 colonies of Escherichia coli appears to be essentially asymptomatic t  .  Muscle skeletal status post right hip fracture with repair-she is not complaining of pain.  Neurologic-no complaints of headache or dizziness.  Psych-does have a history  of dementia however her mental status is considerably improved from what it was when she was first admitted to this facility--   Physical exam;   Temperature 97.7 pulse 69 respirations 20 blood pressure 129/79-103/69-105 or 81 most recently weight 118.4 this is down about 5 pounds since December however it is up about 2 pounds the past  month  .  In general is a pleasant elderly female in no distress sitting in her wheelchair.  Her skin is warm and dry she does have venous stasis changes of her shins bilaterally and also some old bruising of her lower arms bilaterally.  Eyes pupils are equal round reactive to light sclera and conjunctiva are clear extraocular movements intact visual acuity appears intact she does have prescription lenses.  Oropharynx clear mucous membranes moist.  Chest is clear to auscultation bilaterally with somewhat reduced air entry no labored breathing.  Cardiac Ir there is no evidence of CHF--regular rate and rhythm with occasional irregular beat  Abdominal exam is quite benign it is soft nontender with active bowel sounds  GU-cannot appreciate any suprapubic tenderness-negative CV tenderness Extremities; significant venous stasis but no evidence of a DVT--moves all extremities x4   Neurologic-grossly intact her speech is clear.  Psych she is oriented to self somewhat confused but very pleasant and appropriate and follows verbal commands well --as laughing smiling pleasantly confused  Labs  07/07/2013.  Sodium 135 potassium 4.6 BUN 17 creatinine 1.18  06/10/2013.  WBC 10.4 hemoglobin 12.3 platelets 263.  Sodium 138 potassium 4 BUN 13 creatinine 0.96    03/30/2013.  WBC 6.9 hemoglobin 11.5 platelets 212.  Sodium 138 potassium 3.8 BUN 17 creatinine 1.01.  .  02/16/2013.  Fasting glucose 77.  02/05/2013.  WBC 7.0 hemoglobin 10.6 platelets 207.  Sodium 139 potassium 4 BUN 28 creatinine 1.13.  02/12/2013.  BNP-450.9.  01/14/2013.  Liver function tests within normal limits albumin of 2.3   Impression/plan   #1-history of recurrent C. difficile that she is completing an extended course of vancomycin diarrhea appears to have resolved-this will have to be monitored closely she continues on a probiotic routinely #2- UTI?-She is asymptomatic as stated above would be hesitant to treat this  aggressively with no symptoms and history of C. difficile as well as numerous allergies to the appropriate antibiotics-at this point we'll continue to monitor.  #3  Right hip fracture; she appears to be stable.-pain is well controlled--he does have orders for Ultram as well as Tylenol when necessary.  #4..  Chronic renal insufficiency this appears to be stable  -Will update lab  Number 5  Severe hypoalbuminemia with an albumin level of 2.3 on August 6--we'll update this  6  Anemia hemoglobin appears stable per her late December lab Will update this   l  Number7l  Suspect moderate to severe dementia--this has been relatively stable she is on Aricept-   She is seen by psychiatric services and they actually have recently discontinued her Seroquel secondary to stability in her behaviors --however will update hemoglobin A1c secondary to previous history of use of high-risk med   Number 8.  History COPD-she appears to be stable in this regards continues on Pulmicort as well as albuterol nebulizers  #9  Hypertension- v This appears to be stable on Norvasc   #10  History of vaginal bleeding-I previously reviewed records from GYN-a pelvic ultrasound was done which apparently did not show any acute issues it did show symmetrical endometrium did show multiple calcifications and small ovaries-per  notes from GYN on October 7--2014 --only followup when necessary--at this point there has been no further episodes of this to my knowledge   #11  Some history of occult positive stool-this may have secondary to her C. difficile infection in the past-her hemoglobin remained stable she is on iron as well as a proton pump inhibitor  Number 12-history of abdominal discomfort-this required an ER visit about 6 weeks ago she was started on Carafate she is no longer on this per chart review nonetheless clinically she appears to be stable she is on a proton pump inhibitor we'll continue to monitor this appears to be  stable.  #13 history of weight loss she is on supplementation including Magic up and ensure I suspect a recurrent C. difficile contributed to the weight loss she appears to be slowly gaining this back  #13 hypokalemia-this is being supplemented we'll update metabolic panel    .  [id number] of note greater than 45 minutes spent assessing patient-discussing her status with nursing staff--iand coordinating and formulating a plan of care for numerous diagnoses-of note greater than 50% of time spent coordinating plan of care--

## 2013-08-22 LAB — GLUCOSE, CAPILLARY: Glucose-Capillary: 85 mg/dL (ref 70–99)

## 2013-08-23 LAB — GLUCOSE, CAPILLARY: Glucose-Capillary: 70 mg/dL (ref 70–99)

## 2013-08-25 ENCOUNTER — Encounter: Payer: Self-pay | Admitting: *Deleted

## 2013-08-26 LAB — GLUCOSE, CAPILLARY: Glucose-Capillary: 76 mg/dL (ref 70–99)

## 2013-08-27 LAB — GLUCOSE, CAPILLARY: GLUCOSE-CAPILLARY: 70 mg/dL (ref 70–99)

## 2013-08-28 LAB — GLUCOSE, CAPILLARY: GLUCOSE-CAPILLARY: 78 mg/dL (ref 70–99)

## 2013-08-29 LAB — GLUCOSE, CAPILLARY: GLUCOSE-CAPILLARY: 78 mg/dL (ref 70–99)

## 2013-08-30 LAB — GLUCOSE, CAPILLARY: Glucose-Capillary: 69 mg/dL — ABNORMAL LOW (ref 70–99)

## 2013-08-31 LAB — GLUCOSE, CAPILLARY: Glucose-Capillary: 85 mg/dL (ref 70–99)

## 2013-09-04 LAB — GLUCOSE, CAPILLARY: Glucose-Capillary: 81 mg/dL (ref 70–99)

## 2013-09-07 LAB — GLUCOSE, CAPILLARY: GLUCOSE-CAPILLARY: 82 mg/dL (ref 70–99)

## 2013-09-10 LAB — GLUCOSE, CAPILLARY: Glucose-Capillary: 93 mg/dL (ref 70–99)

## 2013-09-11 LAB — GLUCOSE, CAPILLARY: GLUCOSE-CAPILLARY: 80 mg/dL (ref 70–99)

## 2013-09-12 LAB — GLUCOSE, CAPILLARY: GLUCOSE-CAPILLARY: 75 mg/dL (ref 70–99)

## 2013-09-13 LAB — GLUCOSE, CAPILLARY: Glucose-Capillary: 70 mg/dL (ref 70–99)

## 2013-09-14 LAB — GLUCOSE, CAPILLARY: Glucose-Capillary: 76 mg/dL (ref 70–99)

## 2013-09-15 LAB — GLUCOSE, CAPILLARY: GLUCOSE-CAPILLARY: 77 mg/dL (ref 70–99)

## 2013-09-17 ENCOUNTER — Ambulatory Visit: Payer: Medicare Other | Admitting: Pulmonary Disease

## 2013-09-18 LAB — GLUCOSE, CAPILLARY: Glucose-Capillary: 85 mg/dL (ref 70–99)

## 2013-09-19 LAB — GLUCOSE, CAPILLARY: Glucose-Capillary: 75 mg/dL (ref 70–99)

## 2013-09-20 LAB — GLUCOSE, CAPILLARY: Glucose-Capillary: 73 mg/dL (ref 70–99)

## 2013-09-23 LAB — GLUCOSE, CAPILLARY: Glucose-Capillary: 74 mg/dL (ref 70–99)

## 2013-09-26 LAB — GLUCOSE, CAPILLARY
Glucose-Capillary: 66 mg/dL — ABNORMAL LOW (ref 70–99)
Glucose-Capillary: 141 mg/dL — ABNORMAL HIGH (ref 70–99)

## 2013-09-29 LAB — GLUCOSE, CAPILLARY: GLUCOSE-CAPILLARY: 73 mg/dL (ref 70–99)

## 2013-10-08 ENCOUNTER — Telehealth: Payer: Self-pay | Admitting: Pulmonary Disease

## 2013-10-08 NOTE — Telephone Encounter (Addendum)
The number given to me by daughter for [facility] is [phone] or [phone].  [name]

## 2013-10-08 NOTE — Telephone Encounter (Signed)
[name] called back and she stated that she wanted to call and let [facility] know that her mother is doing better.  She is not sure if [place] wants to contact Dr. Dellia Nims and discuss the pt.  pts daughter is aware that I will forward this to [facility] to make him aware.  She stated that the pt is not using the oxygen right now.  Using the neb at bedtime.  Pt is currently at the [facility].   Will forward to [facility].

## 2013-10-08 NOTE — Telephone Encounter (Signed)
Looks like pt will need ov, last seen May 2014 Since there was no number provided, I looked in demographics- only one number listed for [name]- [phone] and got fast busy tone first try and next 2 tried msg states number call can not be completed as dialed, WCB later

## 2013-10-08 NOTE — Telephone Encounter (Signed)
[facility] and spoke with Lovell Sheehan, nurse who takes care of this pt  She states that the pt is actually doing very well  She attends daily activities, is eating well and breathing is stable with sats in the 90's on RA  I advised that the daughter called [place] and was concerned but we do not have correct call back number for her  She gave me her new number, which is [phone] (I have updated this in demographics) Called and LMTCB for [employer]

## 2013-10-09 ENCOUNTER — Ambulatory Visit: Payer: Medicare Other | Admitting: Pulmonary Disease

## 2013-10-09 NOTE — Telephone Encounter (Signed)
Called and lmom to make the pts daughter aware of [employer] recs that the pt will need OV in the near future. pts daughter advised to call back to schedule this appt.

## 2013-10-09 NOTE — Telephone Encounter (Signed)
Noted.  Pt needs ov for f/u in near future

## 2013-10-21 NOTE — Progress Notes (Signed)
This encounter was created in error - please disregard.  This encounter was created in error - please disregard.

## 2013-10-21 NOTE — Progress Notes (Signed)
This encounter was created in error - please disregard.

## 2013-10-29 ENCOUNTER — Non-Acute Institutional Stay (SKILLED_NURSING_FACILITY): Payer: Medicare Other | Admitting: Internal Medicine

## 2013-10-29 ENCOUNTER — Encounter: Payer: Self-pay | Admitting: Internal Medicine

## 2013-10-29 DIAGNOSIS — N179 Acute kidney failure, unspecified: Secondary | ICD-10-CM

## 2013-10-29 DIAGNOSIS — S72001A Fracture of unspecified part of neck of right femur, initial encounter for closed fracture: Secondary | ICD-10-CM

## 2013-10-29 DIAGNOSIS — F0390 Unspecified dementia without behavioral disturbance: Secondary | ICD-10-CM

## 2013-10-29 DIAGNOSIS — I10 Essential (primary) hypertension: Secondary | ICD-10-CM

## 2013-10-29 DIAGNOSIS — D638 Anemia in other chronic diseases classified elsewhere: Secondary | ICD-10-CM

## 2013-10-29 DIAGNOSIS — I714 Abdominal aortic aneurysm, without rupture, unspecified: Secondary | ICD-10-CM

## 2013-10-29 DIAGNOSIS — S72009A Fracture of unspecified part of neck of unspecified femur, initial encounter for closed fracture: Secondary | ICD-10-CM

## 2013-10-29 DIAGNOSIS — J438 Other emphysema: Secondary | ICD-10-CM

## 2013-10-29 DIAGNOSIS — J439 Emphysema, unspecified: Secondary | ICD-10-CM

## 2013-10-29 NOTE — Progress Notes (Signed)
Patient ID: [name], female   DOB: Sep 17, 1926, 80 y.o.   MRN: [id number]          Facility; [facility]   This is a routine visit.    Chief complaint;--medical management of right hip fracture status post repair-COPD-renal insufficiency-dementia-    History; this is an 80 year old woman who came to [place] after suffering a right hip fracture she underwent a right hip hemiarthroplasty. She has a history of COPD on chronic oxygen at 2 L for the last year. Other issues include dementia, tricuspid regurgitation, and chronic renal failure stage II .   Clinically nursing staff says she continues to do well- at first there was some increased confusion and very poor appetite but this improved significantly-however this has been somewhat complicated with a history of recurrent C. difficile infection --however this appears to have stabilized recently.           She also has had a history of vaginal bleeding this has been evaluated by GYN and they have determined at this point no further followup certainly follow up as needed--there has been no reoccurrence to my knowledge.       She was sent to the ER earlier this year a with complaints of acute abdominal pain-however workup there did not show any acute process the scan did show a large hiatal hernia and a stable aortic aneurysm-she was recommendation for Carafate before meals to help with the abdominal discomfort and this appears to have helped   She does have a history of COPD this has been stable for some time and has not been an issue for some time she is on albuterol nebulizers as needed.   Also has a history of anemia she is on iron            -.   Family medical social history reviewed per history and physical on 01/12/2013 .   Medications have been reviewed per MAR.   Marland Kitchen   Review of systems; somewhat limited secondary to dementia --largely obtained from nursing.   General denies any fever or chills   Eyes-she has  prescription lenses does not complaining of visual changes.   Ears nose mouth and throat-he is somewhat hard of hearing does not complaining of any sore throat.   Respiratory no cough or excess short of breath --history of COPD Cardiac no chest pain   GI-as does not complaining of any abdominal pain nausea or vomiting--diarrhea apparently has resolved   GU-is not complaining of any dysuria  t   .   Muscle skeletal status post right hip fracture with repair-she is not complaining of pain.   Neurologic-no complaints of headache or dizziness.   Psych-does have a history of dementia however her mental status is considerably improved from what it was when she was first admitted to this facility--has adopted quite well to this facility    Physical exam;     Temperature 97.6 pulse 70 respirations 20 blood pressure 138/85--131/92-150/86-- weight is 115.6 .   In general is a pleasant elderly female in no distress sitting in her wheelchair.   Her skin is warm and dry she does have venous stasis changes of her shins bilaterally and also some old bruising of her lower arms bilaterally.   Eyes pupils are equal round reactive to light sclera and conjunctiva are clear extraocular movements intact visual acuity appears intact she does have prescription lenses.   Oropharynx clear mucous membranes moist.   Chest is clear  to auscultation bilaterally with somewhat reduced air entry no labored breathing.   Cardiac Ir there is no evidence of CHF-distant heart sounds regular rate and rhythm what I can ascertain-radial pulse regular rate and rhythm   Abdominal exam is quite benign it is soft nontender with active bowel sounds  Extremities; significant venous stasis but no evidence of a DVT--moves all extremities x4    Neurologic-grossly intact her speech is clear.   Psych she is oriented to self somewhat confused but very pleasant and appropriate and follows verbal commands well --as laughing smiling pleasantly  confused   Labs  08/24/2013.  Hemoglobin A1c 5.5.   WBC 6.7 hemoglobin 11.7 platelets 213.  Sodium 139 potassium 3.6 BUN 15 creatinine 0.87.  Liver function tests within normal limits except alkaline phosphatase 131 albumin of 2.5   07/07/2013.   Sodium 135 potassium 4.6 BUN 17 creatinine 1.18   06/10/2013.   WBC 10.4 hemoglobin 12.3 platelets 263.   Sodium 138 potassium 4 BUN 13 creatinine 0.96      03/30/2013.   WBC 6.9 hemoglobin 11.5 platelets 212.   Sodium 138 potassium 3.8 BUN 17 creatinine 1.01.   .   02/16/2013.   Fasting glucose 77.   02/05/2013.   WBC 7.0 hemoglobin 10.6 platelets 207.   Sodium 139 potassium 4 BUN 28 creatinine 1.13.   02/12/2013.   BNP-450.9.   01/14/2013.   Liver function tests within normal limits albumin of 2.3    Impression/plan    #1-history of recurrent C. Difficile--this appears to be improved with no recent recurrence she did receive an extended course of vancomycin-  .   #2   Right hip fracture; she appears to be stable.-pain is well controlled--he does have orders for Ultram as well as Tylenol when necessary.   #3..   Chronic renal insufficiency this appears to be stable   -Will update lab   Number 4   Severe hypoalbuminemia with an albumin level of 2.5 ----we'll update this   5   Anemia hemoglobin appears stable--will  update CBC    l   Number 6   Suspect moderate to severe dementia--this has been relatively stable she is on Aricept-      Number 7.   History COPD-she appears to be stable in this regards continues on Pulmicort as well as albuterol nebulizers   #8   Hypertension- v This appears to be stable on Norvasc--I do not see consistent  systolic elevation    #9  History of vaginal bleeding-I previously reviewed records from GYN-a pelvic ultrasound was done which apparently did not show any acute issues it did show symmetrical endometrium did show multiple calcifications and small ovaries-per notes  from GYN on October 7--2014 --only followup when necessary--at this point there has been no further episodes of this to my knowledge    #11   Some history of occult positive stool-this may have secondary to her C. difficile infection in the past-her hemoglobin remained stable she is on iron as well as a proton pump inhibitor--will  Update    #12-history of abdominal aortic aneurysm-she did have an ultrasound during an ER visit back in January that showed  relative stability --I suspect she would be a poor candidate for aggressive intervention-I did review the note on January 27 which states family and vascular feels the same way     [id number]    .

## 2013-11-04 LAB — GLUCOSE, CAPILLARY: GLUCOSE-CAPILLARY: 74 mg/dL (ref 70–99)

## 2013-11-05 LAB — GLUCOSE, CAPILLARY: Glucose-Capillary: 70 mg/dL (ref 70–99)

## 2013-11-06 LAB — GLUCOSE, CAPILLARY: Glucose-Capillary: 96 mg/dL (ref 70–99)

## 2013-11-07 LAB — GLUCOSE, CAPILLARY: GLUCOSE-CAPILLARY: 79 mg/dL (ref 70–99)

## 2013-11-08 LAB — GLUCOSE, CAPILLARY: Glucose-Capillary: 78 mg/dL (ref 70–99)

## 2013-11-09 LAB — GLUCOSE, CAPILLARY: Glucose-Capillary: 73 mg/dL (ref 70–99)

## 2013-11-10 LAB — GLUCOSE, CAPILLARY: GLUCOSE-CAPILLARY: 84 mg/dL (ref 70–99)

## 2013-11-13 ENCOUNTER — Ambulatory Visit (HOSPITAL_COMMUNITY): Payer: Medicare Other | Attending: Internal Medicine

## 2013-11-13 DIAGNOSIS — M25519 Pain in unspecified shoulder: Secondary | ICD-10-CM | POA: Insufficient documentation

## 2013-12-08 ENCOUNTER — Encounter: Payer: Self-pay | Admitting: Internal Medicine

## 2013-12-08 ENCOUNTER — Non-Acute Institutional Stay (SKILLED_NURSING_FACILITY): Payer: Medicare Other | Admitting: Internal Medicine

## 2013-12-08 DIAGNOSIS — R197 Diarrhea, unspecified: Secondary | ICD-10-CM

## 2013-12-08 DIAGNOSIS — I10 Essential (primary) hypertension: Secondary | ICD-10-CM

## 2013-12-08 DIAGNOSIS — R55 Syncope and collapse: Secondary | ICD-10-CM

## 2013-12-08 DIAGNOSIS — J438 Other emphysema: Secondary | ICD-10-CM

## 2013-12-08 DIAGNOSIS — S72009S Fracture of unspecified part of neck of unspecified femur, sequela: Secondary | ICD-10-CM

## 2013-12-08 DIAGNOSIS — J439 Emphysema, unspecified: Secondary | ICD-10-CM

## 2013-12-08 DIAGNOSIS — S72001S Fracture of unspecified part of neck of right femur, sequela: Secondary | ICD-10-CM

## 2013-12-08 LAB — GLUCOSE, CAPILLARY: Glucose-Capillary: 130 mg/dL — ABNORMAL HIGH (ref 70–99)

## 2013-12-08 NOTE — Progress Notes (Signed)
Patient ID: [name], female   DOB: [birth year], 80 y.o.   MRN: [id number]   Facility; [facility]  This is a routine visit-acute visit .  Chief complaint;--medical management of right hip fracture status post repair-COPD-renal insufficiency-dementia-  Acute visit secondary to syncopal episode earlier today also possible recent diarrhea   Historyof present illness--; this is an 80 year old woman who came to [place] after suffering a right hip fracture she underwent a right hip hemiarthroplasty. She has a history of COPD on chronic oxygen at 2 L for the last year. Other issues include dementia, tricuspid regurgitation, and chronic renal failure stage II .  Clinically nursing staff says she continues to do well- at first there was some increased confusion and very poor appetite but this improved -however this has been somewhat complicated with a history of recurrent C. difficile infection --however this appears to have stabilized recently  However talking with her nursing tech today apparently there were a couple episodes of loose stools last night.  She also had a bowel movement early this morning apparently she appeared to have a syncopal episode where her blood pressure dropped to 68/40-apparently this was just after a bowel movement-however this quickly normalized  systolic's  have been in the 140s to 160s later in the day Her vital signs are stable-however she says she just doesn't feel all that great--is not complaining of any shortness of breath chest pain dizziness syncopal-type feelings.  She also has had a history of vaginal bleeding this has been evaluated by GYN and they have determined at this point no further followup certainly follow up as needed--there has been no reoccurrence to my knowledge.  She was sent to the ER earlier this year a with complaints of acute abdominal pain-however workup there did not show any acute process the scan did show a large hiatal hernia and a stable aortic  aneurysm-she was recommendation for Carafate before meals to help with the abdominal discomfort and this appears to have helped -- She does have a history of COPD this has been stable for some time and has not been an issue for some time she is on albuterol nebulizers as needed.  Also has a history of anemia she is on iron  -.  Family medical social history reviewed per history and physical on 01/12/2013  .  Medications have been reviewed per MAR.  Marland Kitchen  Review of systems; somewhat limited secondary to dementia --largely obtained from nursing.  General denies any fever or chills--just says she feels kind of weak  Eyes-she has prescription lenses does not complaining of visual changes.  Ears nose mouth and throat-he is somewhat hard of hearing does not complaining of any sore throat.  Respiratory no cough or excess short of breath --history of COPD  Cardiac no chest pain or significant edema  GI-as does not complaining of any abdominal pain nausea or vomiting--appears there's been some diarrhea GU-is not complaining of any dysuria  t  .  Muscle skeletal status post right hip fracture with repair-she is not complaining of pain.  Neurologic-no complaints of headache or dizziness--suspected syncopal type episode earlier today with a bowel movement.  Psych-does have a history of dementia however her mental status is considerably improved from what it was when she was first admitted to this facility--has adopted quite well to this facility   Physical exam;   Temperature is 97.9 pulse 69 respirations 20 blood pressure 144/88 with 68/40 just after syncopal episode weight is stable at  116.2 .  In general is a pleasant elderly female in no distress lying comfortably in bed.  Her skin is warm and dry she does have venous stasis changes of her shins bilaterally and also some old bruising of her lower arms bilaterally.  Eyes pupils are equal round reactive to light sclera and conjunctiva are clear  extraocular movements intact visual acuity appears intact she does have prescription lenses.  Oropharynx clear mucous membranes moist.  Chest is clear to auscultation bilaterally with somewhat reduced air entry no labored breathing.  Cardiac Ir there is no evidence of CHFs regular rate and rhythm with some skipped beats at times   Abdominal exam  it is soft nontender with active bowel sounds  Extremities; significant venous stasis but no evidence of a DVT--moves all extremities x4  Neurologic-grossly intact her speech is clear.--I do not see any lateralizing findings cranial nerves are intact  Psych she is oriented to self somewhat confused but very pleasant and appropriate and follows verbal commands well -does not appear as energetic as I am used to seeing her  Labs  10/30/2013.  WBC 6.3 hemoglobin 14.5 platelets 229.  Sodium 137 potassium 4 BUN 16 creatinine 0.94.  Liver function tests within normal limits except alkaline phosphatase of 168 albumin of 3.3  08/24/2013.  Hemoglobin A1c 5.5.  WBC 6.7 hemoglobin 11.7 platelets 213.  Sodium 139 potassium 3.6 BUN 15 creatinine 0.87.  Liver function tests within normal limits except alkaline phosphatase 131 albumin of 2.5  07/07/2013.  Sodium 135 potassium 4.6 BUN 17 creatinine 1.18  06/10/2013.  WBC 10.4 hemoglobin 12.3 platelets 263.  Sodium 138 potassium 4 BUN 13 creatinine 0.96  03/30/2013.  WBC 6.9 hemoglobin 11.5 platelets 212.  Sodium 138 potassium 3.8 BUN 17 creatinine 1.01.  .  02/16/2013.  Fasting glucose 77.  02/05/2013.  WBC 7.0 hemoglobin 10.6 platelets 207.  Sodium 139 potassium 4 BUN 28 creatinine 1.13.  02/12/2013.  BNP-450.9.  01/14/2013.  Liver function tests within normal limits albumin of 2.3    Impression/plan  #1-history of recurrent C. Difficile--one would be been suspicious with this possible history of recent loose stools and some complaints of weakness-Will order C. difficile loose stools x3-also will  obtain a CBC with differential and metabolic panel tomorrow a.m.-monitor vital signs pulse ox every shift x72 hours  .  #2  Right hip fracture; she appears to be stable.-pain is well controlled--he does have orders for Ultram as well as Tylenol when necessary.  #3..  Chronic renal insufficiency this appears to be stable  -Will update lab  Number 4  Severe hypoalbuminemia w--this has improved on recent lab is  3.3 was 2.5- ----we'll update this  5  Anemia hemoglobin appears stable--will update CBC  l  Number 6  Suspect moderate to severe dementia--this has been relatively stable she is on Aricept-  Number 7.  History COPD-she appears to be stable in this regards continues on Pulmicort as well as albuterol nebulizers  #8  Hypertension- v  She is on Norvasc 5 mg a day at this point would be hesitant to increase this even though she appears to have variable systolics ranging from the 130s-150s quite a bit-however a blood pressure again earlier today was low I suspect this was due to a syncopal episode during a bowel movement-at this point will monitor--certainly will consider increasing this at some point however--taken manually this evening it was 144/60  #9  History of vaginal bleeding-I previously reviewed records from GYN-a pelvic  ultrasound was done which apparently did not show any acute issues it did show symmetrical endometrium did show multiple calcifications and small ovaries-per notes from GYN on October 7--2014 --only followup when necessary--at this point there has been no further episodes of this to my knowledge  #11  Some history of occult positive stool-this may have secondary to her C. difficile infection in the past-her hemoglobin remained stable she is on iron as well as a proton pump inhibitor--will Update  #12-history of abdominal aortic aneurysm-she did have an ultrasound during an ER visit back in January that showed relative stability --I suspect she would be a poor  candidate for aggressive intervention-I did review the note on January 27 which states family and vascular feels the same way  #13-recent syncopal episode-she appears to have recovered well from this I suspect this was caused by the bowel movement-however  will have to be monitored with vital signs and we will obtain lab work as well for followup also monitor her stools and obtain C. difficile for any loose stools  Of note I did reevaluate patient later in the evening-her vital signs were stable physical exam was essentially unchanged she said she was feeling  Well--at this point continue to monitor  [id number]-of note greater than 35 minutes spent assessing patient-reassessing patient-reviewing her chart-discussing her status with nursing staff-and  formulating coordinating her plan of care for numerous diagnoses-of note greater than 50% of time spent coordinating plan of care

## 2013-12-14 LAB — GLUCOSE, CAPILLARY: GLUCOSE-CAPILLARY: 106 mg/dL — AB (ref 70–99)

## 2014-01-09 DIAGNOSIS — Y92129 Unspecified place in nursing home as the place of occurrence of the external cause: Secondary | ICD-10-CM

## 2014-01-09 DIAGNOSIS — W19XXXA Unspecified fall, initial encounter: Secondary | ICD-10-CM

## 2014-01-09 HISTORY — DX: Unspecified fall, initial encounter: W19.XXXA

## 2014-01-09 HISTORY — DX: Unspecified place in nursing home as the place of occurrence of the external cause: Y92.129

## 2014-01-20 ENCOUNTER — Emergency Department (HOSPITAL_COMMUNITY): Payer: Medicare Other

## 2014-01-20 ENCOUNTER — Encounter (HOSPITAL_COMMUNITY): Payer: Self-pay | Admitting: Emergency Medicine

## 2014-01-20 ENCOUNTER — Inpatient Hospital Stay
Admission: RE | Admit: 2014-01-20 | Discharge: 2014-10-02 | Disposition: A | Discharge status: Nursing Home (Includes ICF) | Payer: Medicare Other | Source: Ambulatory Visit | Attending: Internal Medicine | Admitting: Internal Medicine

## 2014-01-20 ENCOUNTER — Emergency Department (HOSPITAL_COMMUNITY)
Admission: EM | Admit: 2014-01-20 | Discharge: 2014-01-20 | Disposition: A | Discharge status: Home / Self Care | Payer: Medicare Other | Attending: Emergency Medicine | Admitting: Emergency Medicine

## 2014-01-20 ENCOUNTER — Non-Acute Institutional Stay (SKILLED_NURSING_FACILITY): Payer: Medicare Other | Admitting: Internal Medicine

## 2014-01-20 DIAGNOSIS — N189 Chronic kidney disease, unspecified: Secondary | ICD-10-CM | POA: Insufficient documentation

## 2014-01-20 DIAGNOSIS — R4182 Altered mental status, unspecified: Secondary | ICD-10-CM

## 2014-01-20 DIAGNOSIS — Z79899 Other long term (current) drug therapy: Secondary | ICD-10-CM | POA: Diagnosis not present

## 2014-01-20 DIAGNOSIS — Z87891 Personal history of nicotine dependence: Secondary | ICD-10-CM | POA: Diagnosis not present

## 2014-01-20 DIAGNOSIS — S0003XA Contusion of scalp, initial encounter: Secondary | ICD-10-CM | POA: Diagnosis not present

## 2014-01-20 DIAGNOSIS — W19XXXA Unspecified fall, initial encounter: Secondary | ICD-10-CM

## 2014-01-20 DIAGNOSIS — Z8742 Personal history of other diseases of the female genital tract: Secondary | ICD-10-CM | POA: Insufficient documentation

## 2014-01-20 DIAGNOSIS — F329 Major depressive disorder, single episode, unspecified: Secondary | ICD-10-CM | POA: Insufficient documentation

## 2014-01-20 DIAGNOSIS — W1809XA Striking against other object with subsequent fall, initial encounter: Secondary | ICD-10-CM | POA: Insufficient documentation

## 2014-01-20 DIAGNOSIS — Y9389 Activity, other specified: Secondary | ICD-10-CM | POA: Diagnosis not present

## 2014-01-20 DIAGNOSIS — Y9289 Other specified places as the place of occurrence of the external cause: Secondary | ICD-10-CM | POA: Diagnosis not present

## 2014-01-20 DIAGNOSIS — S0083XA Contusion of other part of head, initial encounter: Secondary | ICD-10-CM | POA: Insufficient documentation

## 2014-01-20 DIAGNOSIS — S3981XA Other specified injuries of abdomen, initial encounter: Secondary | ICD-10-CM | POA: Diagnosis not present

## 2014-01-20 DIAGNOSIS — S298XXA Other specified injuries of thorax, initial encounter: Secondary | ICD-10-CM | POA: Insufficient documentation

## 2014-01-20 DIAGNOSIS — F3289 Other specified depressive episodes: Secondary | ICD-10-CM | POA: Insufficient documentation

## 2014-01-20 DIAGNOSIS — S1093XA Contusion of unspecified part of neck, initial encounter: Secondary | ICD-10-CM

## 2014-01-20 DIAGNOSIS — IMO0002 Reserved for concepts with insufficient information to code with codable children: Secondary | ICD-10-CM | POA: Diagnosis not present

## 2014-01-20 DIAGNOSIS — Y92129 Unspecified place in nursing home as the place of occurrence of the external cause: Secondary | ICD-10-CM

## 2014-01-20 DIAGNOSIS — S2239XA Fracture of one rib, unspecified side, initial encounter for closed fracture: Secondary | ICD-10-CM | POA: Insufficient documentation

## 2014-01-20 DIAGNOSIS — I129 Hypertensive chronic kidney disease with stage 1 through stage 4 chronic kidney disease, or unspecified chronic kidney disease: Secondary | ICD-10-CM | POA: Diagnosis not present

## 2014-01-20 DIAGNOSIS — Z8619 Personal history of other infectious and parasitic diseases: Secondary | ICD-10-CM | POA: Insufficient documentation

## 2014-01-20 DIAGNOSIS — S79919A Unspecified injury of unspecified hip, initial encounter: Secondary | ICD-10-CM | POA: Insufficient documentation

## 2014-01-20 DIAGNOSIS — J4489 Other specified chronic obstructive pulmonary disease: Secondary | ICD-10-CM | POA: Insufficient documentation

## 2014-01-20 DIAGNOSIS — J449 Chronic obstructive pulmonary disease, unspecified: Secondary | ICD-10-CM | POA: Diagnosis not present

## 2014-01-20 DIAGNOSIS — R079 Chest pain, unspecified: Secondary | ICD-10-CM

## 2014-01-20 DIAGNOSIS — S2232XA Fracture of one rib, left side, initial encounter for closed fracture: Secondary | ICD-10-CM

## 2014-01-20 DIAGNOSIS — Z880 Allergy status to penicillin: Secondary | ICD-10-CM | POA: Diagnosis not present

## 2014-01-20 DIAGNOSIS — Z85828 Personal history of other malignant neoplasm of skin: Secondary | ICD-10-CM | POA: Diagnosis not present

## 2014-01-20 DIAGNOSIS — F410 Panic disorder [episodic paroxysmal anxiety] without agoraphobia: Secondary | ICD-10-CM | POA: Diagnosis not present

## 2014-01-20 DIAGNOSIS — D649 Anemia, unspecified: Secondary | ICD-10-CM | POA: Diagnosis not present

## 2014-01-20 DIAGNOSIS — G8929 Other chronic pain: Secondary | ICD-10-CM | POA: Insufficient documentation

## 2014-01-20 DIAGNOSIS — F0390 Unspecified dementia without behavioral disturbance: Secondary | ICD-10-CM | POA: Diagnosis not present

## 2014-01-20 DIAGNOSIS — R0989 Other specified symptoms and signs involving the circulatory and respiratory systems: Secondary | ICD-10-CM

## 2014-01-20 DIAGNOSIS — S79929A Unspecified injury of unspecified thigh, initial encounter: Secondary | ICD-10-CM

## 2014-01-20 DIAGNOSIS — Z9181 History of falling: Principal | ICD-10-CM

## 2014-01-20 DIAGNOSIS — Z86718 Personal history of other venous thrombosis and embolism: Secondary | ICD-10-CM | POA: Insufficient documentation

## 2014-01-20 DIAGNOSIS — I499 Cardiac arrhythmia, unspecified: Secondary | ICD-10-CM

## 2014-01-20 MED ORDER — ONDANSETRON 4 MG PO TBDP
4.0000 mg | ORAL_TABLET | Freq: Once | ORAL | Status: AC
  Administered 2014-01-20: 4 mg via ORAL
  Filled 2014-01-20: qty 1

## 2014-01-20 MED ORDER — TRAMADOL HCL 50 MG PO TABS: 50.0000 mg | ORAL_TABLET | Freq: Four times a day (QID) | ORAL | Status: DC | PRN

## 2014-01-20 NOTE — ED Provider Notes (Signed)
CSN: [id number]     Arrival date & time 01/20/14  1016 History  This chart was scribed for Maudry Diego, MD by Starleen Arms, ED Scribe. This patient was seen in room APA19/APA19 and the patient's care was started at 10:27 AM.   Chief Complaint  Patient presents with  . Fall   Patient is a 80 y.o. female presenting with fall. The history is provided by the patient. No language interpreter was used.  Fall This is a new problem. The current episode started 1 to 2 hours ago. The problem has not changed since onset.Associated symptoms include abdominal pain and headaches. Pertinent negatives include no chest pain. Nothing aggravates the symptoms. Nothing relieves the symptoms. She has tried nothing for the symptoms.    HPI Comments: [name] is a 80 y.o. female from [facility] who presents to the Emergency Department complaining of a fall of unknown mechanism that occurred this morning.  Associated moderate abdominal pain and a contusion on the left side of her head.  Patient denies chest pain currently.    Past Medical History  Diagnosis Date  . Hypertension   . Memory loss   . Anemia   . Chronic back pain   . Chronic kidney disease   . Anxiety   . Panic attack   . Chronic respiratory failure 04/30/2012  . COPD (chronic obstructive pulmonary disease)   . AAA (abdominal aortic aneurysm)   . Cancer     skin Left wrist BCC  . DVT (deep venous thrombosis)   . Aortic aneurysm, abdominal   . Back injury     has had broken back x 2  . Knee problem     Right knee pain  . Arm fracture, right   . Clostridium difficile enteritis 02/2013    treated   . PMB (postmenopausal bleeding) 03/12/2013  . Depression   . Dementia    Past Surgical History  Procedure Laterality Date  . Hemorrhoid surgery    . Hip arthroplasty Right 01/08/2013    Procedure: ARTHROPLASTY BIPOLAR HIP;  Surgeon: Nita Sells, MD;  Location: [facility];  Service: Orthopedics;  Laterality: Right;   Family  History  Problem Relation Age of Onset  . Other Mother   . AAA (abdominal aortic aneurysm) Mother   . Heart attack Mother   . Bladder Cancer Sister   . Breast cancer Sister   . Lung cancer Sister   . Colon cancer Brother   . Colon cancer Brother   . Hyperlipidemia Father   . Hypertension Father   . Heart disease Father     Heart Disease before age 22   History  Substance Use Topics  . Smoking status: Former Smoker -- 1.00 packs/day for 63 years    Types: Cigarettes    Quit date: 04/25/2012  . Smokeless tobacco: Never Used     Comment: PATIENT QUIT AFTER FALL ON 04/2012  . Alcohol Use: No   OB History   Grav Para Term Preterm Abortions TAB SAB Ect Mult Living   5 5        3      Review of Systems  Constitutional: Negative for appetite change and fatigue.  HENT: Negative for congestion, ear discharge and sinus pressure.   Eyes: Negative for discharge.  Respiratory: Negative for cough.   Cardiovascular: Negative for chest pain.  Gastrointestinal: Positive for abdominal pain. Negative for diarrhea.  Genitourinary: Negative for frequency and hematuria.  Musculoskeletal: Negative for back pain.  Skin: Negative for rash.  Neurological: Positive for headaches. Negative for seizures.  Psychiatric/Behavioral: Negative for hallucinations.      Allergies  Biaxin; Erythromycin; Keflex; Macrodantin; Penicillins; and Sulfa antibiotics  Home Medications   Prior to Admission medications   Medication Sig Start Date End Date Taking? Authorizing Provider  acetaminophen (TYLENOL) 500 MG tablet Take 500 mg by mouth every 6 (six) hours as needed for pain.    Historical Provider, MD  albuterol (2.5 MG/3ML) 0.083% NEBU 3 mL, albuterol (5 MG/ML) 0.5% NEBU 0.5 mL Take 1 mEq by nebulization every 2 (two) hours as needed (for wheezing and sob).    Historical Provider, MD  Amino Acids-Protein Hydrolys (FEEDING SUPPLEMENT, PRO-STAT SUGAR FREE 64,) LIQD Take 30 mLs by mouth 3 (three) times daily  with meals. For nutritional supplement    Historical Provider, MD  budesonide (PULMICORT) 0.25 MG/2ML nebulizer solution Take 2 mLs (0.25 mg total) by nebulization 2 (two) times daily. 01/12/13   Barton Dubois, MD  cholecalciferol (VITAMIN D) 1000 UNITS tablet Take 1,000 Units by mouth daily.    Historical Provider, MD  cyanocobalamin (,VITAMIN B-12,) 1000 MCG/ML injection Inject 1,000 mcg into the muscle every 30 (thirty) days.    Historical Provider, MD  donepezil (ARICEPT) 10 MG tablet Take 10 mg by mouth at bedtime.    Historical Provider, MD  escitalopram (LEXAPRO) 5 MG tablet Take 10 mg by mouth daily. For depression    Historical Provider, MD  feeding supplement (ENSURE COMPLETE) LIQD Take 237 mLs by mouth 2 (two) times daily between meals. 01/12/13   Barton Dubois, MD  ferrous sulfate 325 (65 FE) MG tablet Take 325 mg by mouth daily with breakfast.    Historical Provider, MD  folic acid (FOLVITE) 1 MG tablet Take 1 mg by mouth daily.    Historical Provider, MD  Multiple Vitamin (MULTIVITAMIN WITH MINERALS) TABS Take 1 tablet by mouth daily.    Historical Provider, MD  omeprazole (PRILOSEC) 20 MG capsule Take 20 mg by mouth 2 (two) times daily before a meal. For GERD    Historical Provider, MD  potassium chloride 40 MEQ/15ML (20%) LIQD Take by mouth.    Historical Provider, MD  saccharomyces boulardii (FLORASTOR) 250 MG capsule Take 250 mg by mouth 2 (two) times daily. For enteritis, C-difficile    Historical Provider, MD  traMADol (ULTRAM) 50 MG tablet Take one tablet by mouth every 6 hours as needed for pain 04/15/13   Pricilla Larsson, NP   BP 115/68  Pulse 83  Temp(Src) 97.6 F (36.4 C) (Oral)  Resp 16  SpO2 93% Physical Exam  Vitals reviewed. Constitutional: She is oriented to person, place, and time. She appears well-developed.  HENT:  Head: Normocephalic.  Eyes: Conjunctivae and EOM are normal. No scleral icterus.  Neck: Neck supple. No thyromegaly present.  Cardiovascular: Normal  rate and regular rhythm.  Exam reveals no gallop and no friction rub.   No murmur heard. Pulmonary/Chest: No stridor. She has no wheezes. She has no rales. She exhibits no tenderness.  Abdominal: She exhibits no distension. There is no tenderness. There is no rebound.  Musculoskeletal: Normal range of motion. She exhibits no edema.  Swelling and tenderness to left parietal area.  Tendernness to left lateral ribs.  Minor left hip tenderness.   Lymphadenopathy:    She has no cervical adenopathy.  Neurological: She is oriented to person, place, and time. She exhibits normal muscle tone. Coordination normal.  Skin: No rash noted. No erythema.  Psychiatric: She has a normal mood and affect. Her behavior is normal.    ED Course  Procedures (including critical care time)  DIAGNOSTIC STUDIES: Oxygen Saturation is 93% on adequate, adequate by my interpretation.    COORDINATION OF CARE:  10:42 AM Will order labs and imaging.  Patient acknowledges and agrees with plan.    Labs Review Labs Reviewed - No data to display  Imaging Review No results found.   EKG Interpretation None      MDM   Final diagnoses:  None   The chart was scribed for me under my direct supervision.  I personally performed the history, physical, and medical decision making and all procedures in the evaluation of this patient.Maudry Diego, MD 01/20/14 [phone]

## 2014-01-20 NOTE — ED Notes (Signed)
Report called to [facility], pt. To be transported back.

## 2014-01-20 NOTE — ED Notes (Signed)
Pt here from [facility] for evaluation of fall.  Complain of pain on left side. Also, hit her head when she fell. Bleeding noted from back of head

## 2014-01-20 NOTE — ED Notes (Signed)
Called daughter to update her.

## 2014-01-20 NOTE — Discharge Instructions (Signed)
Follow up with your md as needed °

## 2014-01-20 NOTE — ED Notes (Signed)
Left an update pt's daughter's voicemail -Deb Bettini.

## 2014-01-22 NOTE — Progress Notes (Signed)
Patient ID: [name], female   DOB: 27-Feb-1927, 80 y.o.   MRN: [id number]   This is an acute visit.  Level of care skilled.  Facility [facility].  Chief complaint-acute visit secondary to fall complaints of rib question chest pain-question arrhythmia.  History of present illness.  Patient is a pleasant 80 year old female who has been a long-term resident in this facility-was called to the room this morning after patient sustained a fall-apparently she fell out of her wheelchair there is some thought that this may have been a syncopal episode since apparently she's had a near syncopal episode previously this was during a bowel movement   she did fall on her left side in his complaining apparently of some left side pain.  Initially when I saw her she was somewhat lethargic for reevaluation she became somewhat more alert and more herself.  She is complaining of some left lower thorax pain.  Initially her heart sounds were distant I could make out approximately high 40s pulse rate although again this was really difficult to auscultate.  Radial pulse appeared to be a bit higher around 60  Her heart rate was irregular-I do not see a diagnosis specifically of atrial fibrillation  -Her blood pressure was 102/70 O2 saturation was in the 90s she was afebrile.  It appeared on reevaluation her pulse was a bit more regular although was still frequent irregular beats   she did not really complain of any shortness of breath per serial exams.  Family medical social history as been reviewed most recently progress note on 12/08/2013.  Medications have been reviewed per MAR.  Review of systems.--This is somewhat limited secondary to patient's dementia and somewhat altered mental status although again this improved per serial exams  In general she is not complaining of fever chills.  Respiratory does not complain of shortness of breath.  Cardiac is complaining of some lower left thorax pain again  this could be rib related-.  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  GU is not complaining of dysuria.  Muscle skeletal is complaining most prominently on the left lower thorax pain--does not really complain of any arm pain or significant hip discomfort.  Neurologic does not complaining of headache or dizziness again she is a poor historian nursing staff is concerned there may have been a syncopal episode.  Psych she is oriented to self he is pleasant and didn't follow verbal commands better later in the evaluation.  Physical exam.  She is afebrile pulse is now in the 60s respirations 18 blood pressure 102/70 O2 saturation continues to be in the 90s.  In general this is a frail elderly female initially evaluated when she was sitting on the floor and later in bed  -her skin is warm and dry she was significantly pale when I initially evaluated her however her color   appeared to improve  on later exam.  Oropharynx is clear mucous membranes moist.  Her eyes appear reactive to light sclera and conjunctiva are clear visual acuity does not appear to be changed.  Chest is clear to auscultation with somewhat poor respiratory effort she appears to have some pain when she takes deep breaths in her left thorax area.  I do not note any deformity.  Heart again question bradycardic irregular on initial evaluation-pulse rate was somewhat higher on reevaluation although still had frequent irregular  beats.  She has baseline venous stasis lower extremities.  Abdomen is soft does not appear to be tender bowel sounds are positive.  Muscle skeletal does move all extremities x4 I do not note any deformities there is some minimal discomfort with flexion of her hip on the left-but this appears fairly minimal discomfort.  Neurologic-appears grossly intact her grip  strength is intact speech is clear I do not see any lateralizing findings-all extremities x4-her mental status appears improved after the initial exam.  Psych she is oriented to self does follow verbal commands although she appeared increased confused with some difficulty initially.  Labs.  12/09/2013.  WBC 7.3 hemoglobin 14.0 platelets 245.  Sodium 139 potassium 4.1 BUN 21 creatinine 0.9. ALK-PH162-albumin 2.8-otherwise liver function tests within normal limits.  TSH-1.716  Assessment and plan.  #1-fall with questionable syncopal episode-arrhythmia-left sided chest pain-per serial exams patient appeared to improve somewhat however concern here with her initial presentation--with altered mental status-- arrhythmia?? possibly a syncopal episode.-[place] will send to the ER for expedient evaluation.  Again she appears to be doing better on reevaluation but concern with this especially in light of a apparently a previous presyncopal episode-  [id number]

## 2014-01-24 ENCOUNTER — Encounter: Payer: Self-pay | Admitting: Internal Medicine

## 2014-01-24 DIAGNOSIS — R079 Chest pain, unspecified: Secondary | ICD-10-CM | POA: Insufficient documentation

## 2014-01-24 DIAGNOSIS — R4182 Altered mental status, unspecified: Secondary | ICD-10-CM | POA: Insufficient documentation

## 2014-01-24 DIAGNOSIS — I499 Cardiac arrhythmia, unspecified: Secondary | ICD-10-CM | POA: Insufficient documentation

## 2014-02-09 ENCOUNTER — Non-Acute Institutional Stay (SKILLED_NURSING_FACILITY): Payer: Medicare Other | Admitting: Internal Medicine

## 2014-02-09 ENCOUNTER — Encounter: Payer: Self-pay | Admitting: Cardiovascular Disease

## 2014-02-09 ENCOUNTER — Ambulatory Visit (INDEPENDENT_AMBULATORY_CARE_PROVIDER_SITE_OTHER): Payer: Medicare Other | Admitting: Cardiovascular Disease

## 2014-02-09 ENCOUNTER — Encounter: Payer: Self-pay | Admitting: Internal Medicine

## 2014-02-09 VITALS — BP 98/58 | HR 68 | Ht 61.0 in | Wt 118.0 lb

## 2014-02-09 DIAGNOSIS — I714 Abdominal aortic aneurysm, without rupture, unspecified: Secondary | ICD-10-CM

## 2014-02-09 DIAGNOSIS — S72009S Fracture of unspecified part of neck of unspecified femur, sequela: Secondary | ICD-10-CM

## 2014-02-09 DIAGNOSIS — I10 Essential (primary) hypertension: Secondary | ICD-10-CM

## 2014-02-09 DIAGNOSIS — R55 Syncope and collapse: Secondary | ICD-10-CM

## 2014-02-09 DIAGNOSIS — I9589 Other hypotension: Secondary | ICD-10-CM

## 2014-02-09 DIAGNOSIS — J438 Other emphysema: Secondary | ICD-10-CM

## 2014-02-09 DIAGNOSIS — R634 Abnormal weight loss: Secondary | ICD-10-CM

## 2014-02-09 DIAGNOSIS — S72001S Fracture of unspecified part of neck of right femur, sequela: Secondary | ICD-10-CM

## 2014-02-09 NOTE — Progress Notes (Signed)
Patient ID: [name], female   DOB: 1926/11/13, 80 y.o.   MRN: [id number]   Facility; [facility]  This is a routine visit-  .  Chief complaint;--medical management of right hip fracture status post repair-COPD-renal insufficiency-dementia-? history syncopal episodes   History of present illness--; this is an 80 year old woman who came to [place] after suffering a right hip fracture she underwent a right hip hemiarthroplasty. She has a history of COPD. Other issues include dementia, tricuspid regurgitation, and chronic renal failure stage II .  Also has a history of recurrent C. difficile although this has been stable for some time.  Most recent acute episodes appear to revolve around a questionable syncopal episode-check she was sent to the ER several weeks ago after sustaining a fall out of her wheelchair which was apparently suspicious for possible syncopal episode-initially she was somewhat pale with irregular heartbeat-ER evaluation did not really show anything acute and actually she saw cardiolgyt earlier today who did discontinue her Norvasc but as far as doing an aggressive workup did not think at this point was needed especially with her comorbidities-however there was some concern with hypotension and the Norvasc was discontinued t.   .  She also has had a history of vaginal bleeding this has been evaluated by GYN and they have determined at this point no further followup certainly follow up as needed--there has been no reoccurrence to my knowledge.  She was sent to the ER earlier this year a with complaints of acute abdominal pain-however workup there did not show any acute process the scan did show a large hiatal hernia and a stable aortic aneurysm-she was recommendation for Carafate before meals to help with the abdominal discomfort and this appears to have helped --  She does have a history of COPD this has been stable for some time and has not been an issue for some time she is on albuterol  nebulizers as needed.  Also has a history of anemia she is on iron    Patient appears to have lost some weight it appears over the summer lost about 3 pounds between late June in early August-- she is on numerous supplements in fact Magic cup was recently increased she is also on Ensure  However according to nursing staff and family her appetite continues to be spotty.  She denies any abdominal discomfort or difficulty swallowing.   -.  Family medical social history reviewed per history and physical on 01/12/2013  .  Medications have been reviewed per MAR.  Marland Kitchen  Review of systems; somewhat limited secondary to dementia --largely obtained from nursing.  General denies any fever or chills--j Eyes-she has prescription lenses does not complaining of visual changes.  Ears nose mouth and throat-he is somewhat hard of hearing does not complaining of any sore throat.  Respiratory no cough or excess short of breath --history of COPD  Cardiac no chest pain or significant edema  GI-as does not complaining of any abdominal pain nausea or vomiting-no recent diarrhea to my knowledge which is encouraging GU-is not complaining of any dysuria  t  .  Muscle skeletal status post right hip fracture with repair-she is not complaining of pain.  Neurologic-no complaints of headache or dizziness-.  Psych-does have a history of dementia however her mental status is considerably improved from what it was when she was first admitted to this facility--has adopted quite well to this facility   Physical exam;   Dr. 97.8 pulse 64 respirations 20 blood  pressure 136/66 of these appear relatively stable most recent weight 113.8  .  In general is a pleasant elderly female in no distress comfortably in her wheelchair  Her skin is warm and dry she does have venous stasis changes of her shins bilaterally and also some old bruising of her lower arms bilaterally.  Eyes pupils are equal round reactive to light sclera and  conjunctiva are clear extraocular movements intact visual acuity appears intact she does have prescription lenses.  Oropharynx clear mucous membranes moist.  Chest is clear to auscultation bilaterally with somewhat reduced air entry no labored breathing.  Cardiac Ir there is no evidence of CHFs regular rate and rhythm with some skipped beats at times  Abdominal exam it is soft nontender with active bowel sounds  Extremities; significant venous stasis but no evidence of a DVT--moves all extremities x4--no significant edema  Neurologic-grossly intact her speech is clear.--I do not see any lateralizing findings cranial nerves are intact  Psych she is oriented to self somewhat confused but alert and appropriate and follows verbal commands well -   Labs  12/09/2013.  WBC 7.3 hemoglobin 14.0 platelets 245.  Sodium 139 potassium 4.1 BUN 21 creatinine 0.90.   Liver function tests unremarkable except for albumin of 2.8 and alkaline phosphatase 162  TSH-1.716    10/30/2013.  WBC 6.3 hemoglobin 14.5 platelets 229.  Sodium 137 potassium 4 BUN 16 creatinine 0.94.  Liver function tests within normal limits except alkaline phosphatase of 168 albumin of 3.3  08/24/2013.  Hemoglobin A1c 5.5.  WBC 6.7 hemoglobin 11.7 platelets 213.  Sodium 139 potassium 3.6 BUN 15 creatinine 0.87.  Liver function tests within normal limits except alkaline phosphatase 131 albumin of 2.5  07/07/2013.  Sodium 135 potassium 4.6 BUN 17 creatinine 1.18  06/10/2013.  WBC 10.4 hemoglobin 12.3 platelets 263.  Sodium 138 potassium 4 BUN 13 creatinine 0.96  03/30/2013.  WBC 6.9 hemoglobin 11.5 platelets 212.  Sodium 138 potassium 3.8 BUN 17 creatinine 1.01.  .  02/16/2013.  Fasting glucose 77.  02/05/2013.  WBC 7.0 hemoglobin 10.6 platelets 207.  Sodium 139 potassium 4 BUN 28 creatinine 1.13.  02/12/2013.  BNP-450.9.  01/14/2013.  Liver function tests within normal limits albumin of 2.3   Impression/plan     #1-history of recurrent C. Difficile-this now has been stable for some time which is encouraging-  .  #2  Right hip fracture; she appears to be stable.-pain is well controlled--he does have orders for Ultram as well as Tylenol when necessary.   #3..  Chronic renal insufficiency this appears to be stable  -Will update lab   Number 4  Severe hypoalbuminemia --after initial improvement this appears to have gone down-most recently 2.8 we will recheck this I suspect this is not helped by poor y appetite  #5   Anemia hemoglobin appears stable--will update CBC  l  Number 6  Suspect moderate to severe dementia--this has been relatively stable she is on Aricept--although I suspect this has progressed somewhat resulting in somewhat of a poor appetite -  Number 7.  History COPD-she appears to be stable in this regards continues on Pulmicort as well as albuterol nebulizers   #8  Hypertension-   At this point will monitor again cardiology has discontinued her Norvasc secondary to occasional hypotension concerns-recent blood pressures 136/66-132/70-138/76-however apparently a cardiologist earlier today it was in the high 70J systolically  #9  History of vaginal bleeding-I previously reviewed records from GYN-a pelvic ultrasound was done which apparently  did not show any acute issues it did show symmetrical endometrium did show multiple calcifications and small ovaries-per notes from GYN on October 7--2014 --only followup when necessary--at this point there has been no further episodes of this to my knowledge   #11  Some history of occult positive stool-this may have secondary to her C. difficile infection in the past-her hemoglobin remained stable she is on iron as well as a proton pump inhibitor--will Update--   #12-history of abdominal aortic aneurysm-she did have an ultrasound during an ER visit back in January that showed relative stability --I suspect she would be a poor candidate for  aggressive intervention-I did review the note on January 27 which states family and vascular feels the same way   #13-recent?? syncopal episode--I. have reviewed the cardiology note from earlier today again conservative followup has been recommended as long as she is stable-her Norvasc has been discontinued secondary to possible concerns of transitory hypotension at times-at this point will monitor.  #14-weight loss-will order weights q. weekly she is Ensure Magic cup supplementation-will start Remeron as potentially an appetite stimulant--also will update lab work including a CBC CMP and TSH-family has requested a GI consult as well..which I will order  #14-history of depression she is on low-dose Lexapro again the Remeron is being added as appetite stimulant  #15-some history of hypokalemia-she is on supplementation will have to update metabolic panel Family  also requested a GI consult which we will do  [id number]-of note greater than 35 minutes been assessing patient-reviewing her chart and recent consult visits-and coordinating and formulating a plan of care for numerous diagnoses-of note greater than 50% of time spent coordinating plan of care

## 2014-02-09 NOTE — Progress Notes (Signed)
Patient ID: [name], female   DOB: Oct 17, 1926, 80 y.o.   MRN: [id number]       CARDIOLOGY CONSULT NOTE  Patient ID: [name] MRN: [id number] DOB/AGE: [birth year] 80 y.o.  Admit date: (Not on file) Primary Physician Marjorie Smolder, MD  Reason for Consultation: syncope  HPI: The patient is an 80 year old woman who resides at the [facility]. Earlier this month, she had sustained a fall out of her wheelchair and there was some question as to whether or not this was demonstrative of a syncopal episode. She reportedly had a near syncopal episode prior to this occurring in the context of a bowel movement. She was reportedly lethargic and demonstrated some left lower thoracic pain after falling out of her wheelchair. It was also reported that her heart rate was in the high 40 beat per minute range on auscultation but radial pulse seemed to be 60 beats per minute. Her heart rate was reportedly irregular. Her blood pressure was 102/70 with oxygen saturations in the 90% range and she was reportedly afebrile. ECG on 01/20/2014 demonstrated normal sinus rhythm with PVCs. There were no ischemic ST segment or T-wave abnormalities.  ECG on 01/08/2014 demonstrated sinus bradycardia, heart rate 55 beats per minute with a PR interval of 230 ms indicative of a mild first-degree AV block. She does not recall any other symptoms surrounding this episode. She does not believe she is taking any medications. She denies chest pain, shortness of breath, palpitations, lightheadedness and dizziness.   Allergies  Allergen Reactions  . Biaxin [Clarithromycin]   . Erythromycin   . Keflex [Cephalexin]   . Macrodantin [Nitrofurantoin Macrocrystal]   . Penicillins   . Sulfa Antibiotics     Current Outpatient Prescriptions  Medication Sig Dispense Refill  . acetaminophen (TYLENOL) 500 MG tablet Take 500 mg by mouth every 6 (six) hours as needed for pain.      Marland Kitchen albuterol (2.5 MG/3ML) 0.083% NEBU 3 mL, albuterol  (5 MG/ML) 0.5% NEBU 0.5 mL Take 1 mEq by nebulization every 2 (two) hours as needed (for wheezing and sob).      . Amino Acids-Protein Hydrolys (FEEDING SUPPLEMENT, PRO-STAT SUGAR FREE 64,) LIQD Take 30 mLs by mouth 3 (three) times daily with meals. For nutritional supplement      . amLODipine (NORVASC) 5 MG tablet Take 5 mg by mouth daily.      . budesonide (PULMICORT) 0.25 MG/2ML nebulizer solution Take 2 mLs (0.25 mg total) by nebulization 2 (two) times daily.  60 mL  12  . cholecalciferol (VITAMIN D) 1000 UNITS tablet Take 1,000 Units by mouth daily.      . cyanocobalamin (,VITAMIN B-12,) 1000 MCG/ML injection Inject 1,000 mcg into the muscle every 30 (thirty) days.      Marland Kitchen escitalopram (LEXAPRO) 5 MG tablet Take 5 mg by mouth daily. For depression      . feeding supplement (ENSURE COMPLETE) LIQD Take 237 mLs by mouth 2 (two) times daily between meals.      . folic acid (FOLVITE) 1 MG tablet Take 1 mg by mouth daily.      . Multiple Vitamin (MULTIVITAMIN WITH MINERALS) TABS Take 1 tablet by mouth daily.      Marland Kitchen omeprazole (PRILOSEC) 20 MG capsule Take 20 mg by mouth 2 (two) times daily before a meal. For GERD      . potassium chloride SA (K-DUR,KLOR-CON) 20 MEQ tablet Take 40 mEq by mouth daily.      . traMADol (  ULTRAM) 50 MG tablet Take one tablet by mouth every 6 hours as needed for pain  120 tablet  5  . traMADol (ULTRAM) 50 MG tablet Take 1 tablet (50 mg total) by mouth every 6 (six) hours as needed.  25 tablet  0  . donepezil (ARICEPT) 10 MG tablet Take 10 mg by mouth at bedtime.       No current facility-administered medications for this visit.    Past Medical History  Diagnosis Date  . Hypertension   . Memory loss   . Anemia   . Chronic back pain   . Chronic kidney disease   . Anxiety   . Panic attack   . Chronic respiratory failure 04/30/2012  . COPD (chronic obstructive pulmonary disease)   . AAA (abdominal aortic aneurysm)   . Cancer     skin Left wrist BCC  . DVT (deep  venous thrombosis)   . Aortic aneurysm, abdominal   . Back injury     has had broken back x 2  . Knee problem     Right knee pain  . Arm fracture, right   . Clostridium difficile enteritis 02/2013    treated   . PMB (postmenopausal bleeding) 03/12/2013  . Depression   . Dementia     Past Surgical History  Procedure Laterality Date  . Hemorrhoid surgery    . Hip arthroplasty Right 01/08/2013    Procedure: ARTHROPLASTY BIPOLAR HIP;  Surgeon: Nita Sells, MD;  Location: [facility];  Service: Orthopedics;  Laterality: Right;    History   Social History  . Marital Status: Married    Spouse Name: N/A    Number of Children: N/A  . Years of Education: N/A   Occupational History  . retired    Social History Main Topics  . Smoking status: Former Smoker -- 1.00 packs/day for 63 years    Types: Cigarettes    Quit date: 04/25/2012  . Smokeless tobacco: Never Used     Comment: PATIENT QUIT AFTER FALL ON 04/2012  . Alcohol Use: No  . Drug Use: No  . Sexual Activity: No   Other Topics Concern  . Not on file   Social History Narrative  . No narrative on file     No family history of premature CAD in 1st degree relatives.  Prior to Admission medications   Medication Sig Start Date End Date Taking? Authorizing Provider  acetaminophen (TYLENOL) 500 MG tablet Take 500 mg by mouth every 6 (six) hours as needed for pain.   Yes Historical Provider, MD  albuterol (2.5 MG/3ML) 0.083% NEBU 3 mL, albuterol (5 MG/ML) 0.5% NEBU 0.5 mL Take 1 mEq by nebulization every 2 (two) hours as needed (for wheezing and sob).   Yes Historical Provider, MD  Amino Acids-Protein Hydrolys (FEEDING SUPPLEMENT, PRO-STAT SUGAR FREE 64,) LIQD Take 30 mLs by mouth 3 (three) times daily with meals. For nutritional supplement   Yes Historical Provider, MD  amLODipine (NORVASC) 5 MG tablet Take 5 mg by mouth daily.   Yes Historical Provider, MD  budesonide (PULMICORT) 0.25 MG/2ML nebulizer solution Take 2  mLs (0.25 mg total) by nebulization 2 (two) times daily. 01/12/13  Yes Barton Dubois, MD  cholecalciferol (VITAMIN D) 1000 UNITS tablet Take 1,000 Units by mouth daily.   Yes Historical Provider, MD  cyanocobalamin (,VITAMIN B-12,) 1000 MCG/ML injection Inject 1,000 mcg into the muscle every 30 (thirty) days.   Yes Historical Provider, MD  escitalopram (LEXAPRO) 5 MG tablet  Take 5 mg by mouth daily. For depression   Yes Historical Provider, MD  feeding supplement (ENSURE COMPLETE) LIQD Take 237 mLs by mouth 2 (two) times daily between meals. 01/12/13  Yes Barton Dubois, MD  folic acid (FOLVITE) 1 MG tablet Take 1 mg by mouth daily.   Yes Historical Provider, MD  Multiple Vitamin (MULTIVITAMIN WITH MINERALS) TABS Take 1 tablet by mouth daily.   Yes Historical Provider, MD  omeprazole (PRILOSEC) 20 MG capsule Take 20 mg by mouth 2 (two) times daily before a meal. For GERD   Yes Historical Provider, MD  potassium chloride SA (K-DUR,KLOR-CON) 20 MEQ tablet Take 40 mEq by mouth daily.   Yes Historical Provider, MD  traMADol (ULTRAM) 50 MG tablet Take one tablet by mouth every 6 hours as needed for pain 04/15/13  Yes Pricilla Larsson, NP  traMADol (ULTRAM) 50 MG tablet Take 1 tablet (50 mg total) by mouth every 6 (six) hours as needed. 01/20/14  Yes Maudry Diego, MD  donepezil (ARICEPT) 10 MG tablet Take 10 mg by mouth at bedtime.    Historical Provider, MD     Review of systems complete and found to be negative unless listed above in HPI     Physical exam Blood pressure 98/58, pulse 68, height 5\' 1"  (1.549 m), weight 118 lb (53.524 kg), SpO2 98.00%. General: NAD, elderly, frail. Neck: No JVD, no thyromegaly or thyroid nodule.  Lungs: Clear to auscultation bilaterally with normal respiratory effort. CV: Nondisplaced PMI. Regular rate and rhythm, normal S1/S2, no S3/S4, II/VI ejection-systolic murmur over RUSB.  No peripheral edema.   Abdomen: Soft, no distention.  Skin: Intact without lesions or  rashes.  Neurologic: Alert.  Psych: Normal affect. Extremities: No clubbing or cyanosis.  HEENT: Normal.   ECG: Most recent ECG reviewed.  Labs:   Lab Results  Component Value Date   WBC 9.0 07/07/2013   HGB 13.5 07/07/2013   HCT 40.8 07/07/2013   MCV 90.5 07/07/2013   PLT 199 07/07/2013   No results found for this basename: NA, K, CL, CO2, BUN, CREATININE, CALCIUM, LABALBU, PROT, BILITOT, ALKPHOS, ALT, AST, GLUCOSE,  in the last 168 hours No results found for this basename: CKTOTAL, CKMB, CKMBINDEX, TROPONINI    No results found for this basename: CHOL   No results found for this basename: HDL   No results found for this basename: LDLCALC   No results found for this basename: TRIG   No results found for this basename: CHOLHDL   No results found for this basename: LDLDIRECT         Studies: No results found.  ASSESSMENT AND PLAN:  1. Possible syncopal episode: I reviewed prior ECGs. Other than premature ventricular contractions and a mild first degree AV block based on ECGs performed in July and August, I do not see any evidence of atrial fibrillation or significant bradyarrhythmias. She denies antecedent chest pain, palpitations and shortness of breath, and I do not find any evidence for these symptoms at the time of her episode. Her blood pressure today is 98/58. There may have been some degree of hypotension leading to her event but is difficult to say. I will discontinue amlodipine for the time being. I do not feel proceeding with a 30 day event monitor or echocardiography would be a judicious use of resources, as I do not feel that this poor, elderly, frail woman should be subjected to invasive procedures such as pacemaker placement even if she were to demonstrate  significant bradyarrhythmias, which I do not find at the present time. I feel watchful waiting is warranted.  Dispo: f/u prn.  Signed: Kate Sable, M.D., F.A.C.C.  02/09/2014, 9:54 AM

## 2014-02-09 NOTE — Patient Instructions (Signed)
Your physician recommends that you schedule a follow-up appointment in: only as needed     Your physician has recommended you make the following change in your medication:     STOP Amlodipine     Thank you for choosing [facility] !

## 2014-02-10 ENCOUNTER — Telehealth: Payer: Self-pay

## 2014-02-10 DIAGNOSIS — I714 Abdominal aortic aneurysm, without rupture, unspecified: Secondary | ICD-10-CM

## 2014-02-10 DIAGNOSIS — R198 Other specified symptoms and signs involving the digestive system and abdomen: Secondary | ICD-10-CM

## 2014-02-10 NOTE — Telephone Encounter (Signed)
Phone call from pt's daughter.  Requested an appt. with Dr. Scot Dock, for f/u on AAA.  Related hx. That her mother fell in July, and again in mid August.  Stated her BP and Pulse were noted to be very low @ time of fall.  Reported she had cardiac testing, and that "it was okay."  Stated her mother c/o her right side hurts and she feels full.  Has had difficulty eating due to loss of appetite.  Daughter expressed concern that due to the history of AAA, if the aneurysm has increased in size from the fall, it could be pressing on her stomach, and affecting her in a way that she feels full.  Discussed with Dr. Scot Dock.  Recommended to schedule for CTA Abd/ Pelvis, and office visit.

## 2014-02-11 NOTE — Telephone Encounter (Signed)
CTA Abd/Pelvis is scheduled for 02/24/2014 @ [facility] at 10:15am. Follow up with Dr Scot Dock is scheduled for 02/24/2014 @ 12:30pm.  I spoke with patients daughter, [name] to notify of the above. She asked that I notify [facility] and request transportation as well. I spoke with [name], [name]'s RN at [facility], to notify of appointment and to request transportation. I also inquired if patients BUN and Creatinine could be drawn in house. [name] asked that I fax the order to [phone] along with any additional instructions for her CTA.   I have faxed the CT appt letter, an order for BMET and my contact information to [phone]. Fax confirmation received. Filed at [employer] with month of September, dpm

## 2014-02-12 ENCOUNTER — Non-Acute Institutional Stay (SKILLED_NURSING_FACILITY): Payer: Medicare Other | Admitting: Internal Medicine

## 2014-02-12 ENCOUNTER — Encounter: Payer: Self-pay | Admitting: Internal Medicine

## 2014-02-12 DIAGNOSIS — R748 Abnormal levels of other serum enzymes: Secondary | ICD-10-CM

## 2014-02-12 DIAGNOSIS — R55 Syncope and collapse: Secondary | ICD-10-CM

## 2014-02-12 NOTE — Progress Notes (Signed)
Patient ID: [name], female   DOB: Oct 12, 1926, 80 y.o.   MRN: [id number]   This is an acute visit. Question syncopal episode.  History of present illness.   Level care skilled.  Facility [facility].  Chief complaint-acute visit secondary to  question-syncopal episode.  History of present illness.  Patient is a pleasant 80 year old female is a long-term resident in this facility-apparently earlier today she had a possible syncopal episode-apparently she had some altered mental status-blood pressure was initially difficult to obtain her pulse apparently was in the 40s.  However fairly soon after that her mental status returned to baseline blood pressure systolically was [id number] her pulse apparently was back to normal-.  When I evaluated her patient was resting comfortably in bed has baseline confusion but was alert talking eating her lunch appear to be essentially at her baseline-her vital signs were stable with a pulse of 60 that was regular-blood pressure was 122/70.  This is not the first time she's had a similar episode.  She actually went to the ER at one point with no specific etiology found.  She actually saw cardiology earlier this week-her Norvasc was discontinued for concerns possibly this was causing some hypotension-conclusion was that she was probably a poor candidate for an aggressive workup including a loop recorder.--However I do note there was recommendation for possibly a 30 day event monitor if there is a recurrence  They did review her EKGs most recent one actually showed normal sinus rhythm with PVCs-this was on August 12.  On July 31 another EKG showed sinus bradycardia with mild first degree AV block.  Family medical social history has been reviewed verb progress notes most recently on 01/22/2014 as well as cardiology note on 02/09/2014  Medications have been reviewed per [facility] she is not on any beta blocker I do note she is on Aricept.  Review of systems this is  somewhat difficult to obtain since patient has baseline confusion.  In general no complaints fever chills.  Respiratory has no complaints of shortness of breath and doesn't even during the episodes.  Cardiac no complaints of chest pain again does not complaining of this during any episodes either.  GI no nausea vomiting diarrhea constipation noted she does have some history of recurrent C. difficile which has been stable now for some time.  GU does not complain of dysuria or back pain.  Muscle skeletal no joint pain complaints.  Neurologic-again some history of possible syncopal episodes but does not complaining of any headache or dizziness and did not complain of that during the episodes either.  Physical exam.  She's afebrile pulse 60 when I  evaluated her x2 with 60-blood pressure 122/70.  In general this is a pleasant elderly female in no distress resting comfortably in bed she was doing this all times I evaluated her.  Her skin was warm and dry.  Chest clear to auscultation no labored breathing.  Heart is regular rate and rhythm with a 1-2/4 systolic murmur.  Abdomen is soft nontender with positive bowel sounds.  Muscle skeletal moves all extremities x4 normally ablates wheelchair.  Neurologic I could not really appreciate any focal deficits her speech is clear has baseline confusion but is pleasant and appropriate-moves all extremities x4 no lateralizing findings.  Psych she is oriented to self follows verbal commands without difficulty his pleasant and interactive although again quite confused which is her baseline.  Labs.  02/10/2014.  WBC 6.5 hemoglobin 11.5 platelets 267.  Sodium 140 potassium 4.7  BUN 22 creatinine 1.12.  Coupled phosphatase 285-albumin 2.5-otherwise liver function tests within normal limits.  TSH-1.26.  [id number].  Assessment and plan.  Syncopal episode?-Again this is quite transitory she has been back to her baseline the rest of the day  her serial exams-this was discussed with Dr. Dellia Nims via phone at this point will monitor her pulse and blood pressures every shift to keep an eye on her.  Also will write order to have cardiology possibly reevaluate---- ?? a loop recorder it appears this may be a possibility with any reoccurrence would like to make them aware although again it appears the recommendation is to be fairly conservative here as long as she is clinically stable I also note she is on Aricept but would like cardiology to weigh in on the benefits of discontinuing this  #2-elevated alkaline phosphatase it appears she has somewhat of a history of this-but has risen from previous level-at this point will order a GGT    .  [id number]

## 2014-02-17 ENCOUNTER — Non-Acute Institutional Stay (SKILLED_NURSING_FACILITY): Payer: Medicare Other | Admitting: Internal Medicine

## 2014-02-17 ENCOUNTER — Encounter: Payer: Self-pay | Admitting: Internal Medicine

## 2014-02-17 DIAGNOSIS — R630 Anorexia: Secondary | ICD-10-CM

## 2014-02-17 DIAGNOSIS — R748 Abnormal levels of other serum enzymes: Secondary | ICD-10-CM

## 2014-02-17 NOTE — Progress Notes (Signed)
Patient ID: [name], female   DOB: 1926/11/11, 80 y.o.   MRN: [id number]   Facility; [facility]  This is an acute visit-  .  Chief complaint;--acute visit secondary to poor appetite-question renal insufficiency-elevated alkaline phosphatase   History of present illness--; this is an 80 year old woman who came to [place] after suffering a right hip fracture she underwent a right hip hemiarthroplasty. She has a history of COPD. Other issues include dementia, tricuspid regurgitation, and chronic renal failure stage II .  Also has a history of recurrent C. difficile although this has been stable for some time.  Most recent acute episodes appear to revolve around a questionable syncopal episode-check she was sent to the ER several weeks ago after sustaining a fall out of her wheelchair which was apparently suspicious for possible syncopal episode-initially she was somewhat pale with irregular heartbeat-ER evaluation did not really show anything acute and actually she saw cardiolgyt  who did discontinue her Norvasc but as far as doing an aggressive workup did not think at this point was needed especially with her comorbidities-however there was some concern with hypotension and the Norvasc was discontinued  He appeared to have another syncopal episode last week to my knowledge these have not reoccurred since then.  Patient apparently has lost some weight here over the summer-her appetite has been poor and she has been started on Remeron-however a couple days ago nursing staff was concerned thinking she may be getting dehydrated-did order a couple liters of IV fluids which she has completed today Her metabolic panel actually looked fairly benign with a creatinine of 1.1 and BUN of 24--possibly mildly elevated above her baselin4-of note her alkaline phosphatase was elevated at 284--we also obtained a GGT which is normal at 26.  The nursing staff she is doing better today eating and drinking better-she does  continue on supplements.    .  -.  Family medical social history reviewed per history and physical on 01/12/2013  .  Medications have been reviewed per MAR.  Marland Kitchen  Review of systems; somewhat limited secondary to dementia --largely obtained from nursing.  General denies any fever or chills--j  Eyes-she has prescription lenses does not complaining of visual changes.  Ears nose mouth and throat-he is somewhat hard of hearing does not complaining of any sore throat.  Respiratory no cough or excess short of breath --history of COPD  Cardiac no chest pain or significant edema  GI-as does not complaining of any abdominal pain nausea or vomiting-no recent diarrhea to my knowledge which is encouraging  GU-is not complaining of any dysuria  t  .  Muscle skeletal status post right hip fracture with repair-she is not complaining of pain.  Neurologic-no complaints of headache or dizziness-.  Psych-does have a history of dementia however her mental status is considerably improved from what it was when she was first admitted to this facility--has adopted quite well to this facility-although she is somewhat agitated today-suspect this was secondary to attempted blood draw this morning --which she is refusing  Physical exam;   Temperature 99.0 F. pulse 60-respirations 20 blood pressure 143/85--last vlisted weight is 110.6 this down about 5 pounds since May  .  In general is a pleasant elderly female in no distress but somewhat agitated today  Her skin is warm and dry she does have venous stasis changes of her shins bilaterally and also some old bruising of her lower arms bilaterally.  Eyes pupils are equal round reactive to light  sclera and conjunctiva are clear extraocular movements intact visual acuity appears intact she does have prescription lenses.  Oropharynx clear mucous membranes moist.  Chest is clear to auscultation bilaterally with somewhat reduced air entry no labored breathing.  Cardiac I  there is no evidence of CHFs regular rate and rhythm with some skipped beats at times--no significant lower extremity edema  Abdominal exam it is soft nontender with active bowel sounds  Extremities; significant venous stasis but no evidence of a DVT--moves all extremities x4--no significant edema  Neurologic-grossly intact her speech is clear.--I do not see any lateralizing findings cranial nerves are intact  Psych she is oriented to self somewhat confused but alert and appropriate and follows verbal commands well -but again agitated I suspect secondary to the attempted blood draw   Labs 02/15/2014.  WBC 7.1-hemoglobin 11.7 platelets 196.  Sodium 141 potassium 4.8-BUN 24-creatinine 1.1.  Alkaline phosphatase 284-albumin 2.6-otherwise liver function tests within normal limits  GGT were within normal limits at 26   12/09/2013.  WBC 7.3 hemoglobin 14.0 platelets 245.  Sodium 139 potassium 4.1 BUN 21 creatinine 0.90.  Liver function tests unremarkable except for albumin of 2.8 and alkaline phosphatase 162  TSH-1.716  10/30/2013.  WBC 6.3 hemoglobin 14.5 platelets 229.  Sodium 137 potassium 4 BUN 16 creatinine 0.94.  Liver function tests within normal limits except alkaline phosphatase of 168 albumin of 3.3  08/24/2013.  Hemoglobin A1c 5.5.  WBC 6.7 hemoglobin 11.7 platelets 213.  Sodium 139 potassium 3.6 BUN 15 creatinine 0.87.  Liver function tests within normal limits except alkaline phosphatase 131 albumin of 2.5  07/07/2013.  Sodium 135 potassium 4.6 BUN 17 creatinine 1.18  06/10/2013.  WBC 10.4 hemoglobin 12.3 platelets 263.  Sodium 138 potassium 4 BUN 13 creatinine 0.96  03/30/2013.  WBC 6.9 hemoglobin 11.5 platelets 212.  Sodium 138 potassium 3.8 BUN 17 creatinine 1.01.  .  02/16/2013.  Fasting glucose 77.  02/05/2013.  WBC 7.0 hemoglobin 10.6 platelets 207.  Sodium 139 potassium 4 BUN 28 creatinine 1.13.  02/12/2013.  BNP-450.9.  01/14/2013.  Liver function tests  within normal limits albumin of 2.3   Impression/plan  #1-poor appetite back apparently this is somewhat spotty nursing staff reports she is doing pretty well today-she did get a couple liters of IV fluids although her labs looked fairly baseline--she continues on Remeron for appetite stimulation as well as supplements.  Did order weights q. weekly I do not see the most recent weight-.  At this point will update a BMP for tomorrow hopefully this will be obtained again patient is somewhat agitated today with a blood draw.  I also note her hemoglobin seems to vary from 11-14 area now on the lower end of her baseline will update this as well-she does have a history of anemia he is on iron this appears to be a normocytic anemia.  #2 elevated alkaline phosphatase this is trending up-GGT is within normal limits which would make one suspect a bone etiology-Will initially order a sedimentation rate as well as serum protein electrophoresis--this was discussed with Dr. Dellia Nims  [phone]  .  Will update labs tomorrow including a BMP.

## 2014-02-19 ENCOUNTER — Non-Acute Institutional Stay (SKILLED_NURSING_FACILITY): Payer: Medicare Other | Admitting: Internal Medicine

## 2014-02-19 DIAGNOSIS — N390 Urinary tract infection, site not specified: Secondary | ICD-10-CM

## 2014-02-19 NOTE — Progress Notes (Signed)
Patient ID: [name], female   DOB: Jun 08, 1927, 80 y.o.   MRN: [id number]   This is an acute visit.  Level of care skilled.  Facility [facility].  Chief complaint-acute visit secondary to UTI.  History of present illness.  Patient is a pleasant elderly resident who does have a history of frequent UTIs.  She recently had increased agitation and paranoia -- per discussion with nursing staff and family this happens frequently when she has a UTI and a urine culture has grown out 2 organisms Escherichia coli and Proteus Grabill is.  She has been afebrile continues to have somewhat more confusion than baseline.  This is complicated with a history of allergies to numerous medications.  Sensitivity showed this is sensitive to cephalosporins and generally as well as Augmentin resistant to Cipro which she has been on empirically.  Family medical social history as been reviewed.  Family medical social history reviewed per history and physical on 01/12/2013  .  Medications have been reviewed per MAR.  Allergies have been reviewed as well which are numerous and include Biaxin-rash in mouth--erythromycin-GI side effects--Keflex-disorientation--Macrobid-rash in mouth--penicillin--rash in mouth  --- and sulfa drugs-mouth sores .  Review of systems; somewhat limited secondary to dementia --largely obtained from nursing.  General denies any fever or chills--j  Eyes-she has prescription lenses does not complaining of visual changes.  Ears nose mouth and throat-he is somewhat hard of hearing does not complaining of any sore throat.  Respiratory no cough or excess short of breath --history of COPD  Cardiac no chest pain or significant edema  GI-as does not complaining of any abdominal pain nausea or vomiting-no recent diarrhea to my knowledge which is encouraging  GU-is not complaining of any dysuria  t  .  Muscle skeletal status post right hip fracture with repair-she is not complaining of pain.    Neurologic-no complaints of headache or dizziness-.  Psych-does have a history of dementia-however per family and nursing staff does have more confusion than baseline paranoia agitation   Physical exam;  T Temperature 97.1 pulse 60 respirations 20 blood pressure 153/78-116/70 in this range recently  .  In general is a pleasant elderly female in no distress but somewhat agitated   Her skin is warm and dry she does have venous stasis changes of her shins bilaterally and also some old bruising of her lower arms bilaterally.  Eyes pupils are equal round reactive to light sclera and conjunctiva are clear extraocular movements intact visual acuity appears intact she does have prescription lenses.  Oropharynx clear mucous membranes moist.  Chest is clear to auscultation bilaterally with somewhat reduced air entry no labored breathing.  Cardiac I there is no evidence of CHFs regular rate and rhythm with some skipped beats at times--no significant lower extremity edema --as a 2/6 systolic murmur Abdominal exam it is soft nontender with active bowel sounds GU-does appear to have some suprapubic tenderness  Extremities; significant venous stasis but no evidence of a DVT--moves all extremities x4--no significant edema  Neurologic-grossly intact her speech is clear.--I do not see any lateralizing findings cranial nerves are intact  Psych she is oriented to self somewhat confused but alert and appropriate and follows verbal commands well -but continues to have some agitation   Labs  02/15/2014.  WBC 7.1-hemoglobin 11.7 platelets 196.  Sodium 141 potassium 4.8-BUN 24-creatinine 1.1.  Alkaline phosphatase 284-albumin 2.6-otherwise liver function tests within normal limits  GGT were within normal limits at 26  12/09/2013.  WBC 7.3  hemoglobin 14.0 platelets 245.  Sodium 139 potassium 4.1 BUN 21 creatinine 0.90.  Liver function tests unremarkable except for albumin of 2.8 and alkaline phosphatase 162   TSH-1.716  10/30/2013.  WBC 6.3 hemoglobin 14.5 platelets 229.  Sodium 137 potassium 4 BUN 16 creatinine 0.94.  Liver function tests within normal limits except alkaline phosphatase of 168 albumin of 3.3  08/24/2013.  Hemoglobin A1c 5.5.  WBC 6.7 hemoglobin 11.7 platelets 213.  Sodium 139 potassium 3.6 BUN 15 creatinine 0.87.  Liver function tests within normal limits except alkaline phosphatase 131 albumin of 2.5  07/07/2013.  Sodium 135 potassium 4.6 BUN 17 creatinine 1.18  06/10/2013.  WBC 10.4 hemoglobin 12.3 platelets 263.  Sodium 138 potassium 4 BUN 13 creatinine 0.96  03/30/2013.  WBC 6.9 hemoglobin 11.5 platelets 212.  Sodium 138 potassium 3.8 BUN 17 creatinine 1.01.  .  02/16/2013.  Fasting glucose 77.  02/05/2013.  WBC 7.0 hemoglobin 10.6 platelets 207.  Sodium 139 potassium 4 BUN 28 creatinine 1.13.  02/12/2013.  BNP-450.9.  01/14/2013.  Liver function tests within normal limits albumin of 2.3   Assessment and plan.  #1-UTI-this is quite challenging with a history of numerous drug allergies-I did speak with her daughter via phone who stated she does not know of any anaphylactic type reaction with antibiotics-also contacted her previous primary care provider who was kind enough to send over a list of her allergies and reactions-it appears most common reaction is rash-mouth sores-I note with Keflex disorientation is the side effect listed.  I did call her daughter again-her daughtersayst that her mother exhibits increased agitation and paranoid thoughts when she has a UTIi would prefer that she receives aggressive treatment.  Will start her on Rocephin which is a cephalosporin-Will have to monitor closely for any allergic reaction rash angioedema throat discomfort or shortness of breath-also will write up when necessary Benadryl every 6 hours for 72 hours-also will start probiotic twice a day for 10 days-.  Of note we'll discontinue the Cipro   [id number]-of note  greater than 30 minutes spent assessing patient-and formulating and coordinating her plan of care-of note greater than 50% of time spent coordinating plan of care

## 2014-02-21 ENCOUNTER — Encounter: Payer: Self-pay | Admitting: Internal Medicine

## 2014-02-23 ENCOUNTER — Encounter: Payer: Self-pay | Admitting: Vascular Surgery

## 2014-02-24 ENCOUNTER — Ambulatory Visit
Admission: RE | Admit: 2014-02-24 | Discharge: 2014-02-24 | Disposition: A | Discharge status: Home / Self Care | Payer: Medicare Other | Source: Ambulatory Visit | Attending: Vascular Surgery | Admitting: Vascular Surgery

## 2014-02-24 ENCOUNTER — Ambulatory Visit (INDEPENDENT_AMBULATORY_CARE_PROVIDER_SITE_OTHER): Payer: Medicare Other | Admitting: Vascular Surgery

## 2014-02-24 ENCOUNTER — Encounter: Payer: Self-pay | Admitting: Vascular Surgery

## 2014-02-24 VITALS — BP 97/50 | HR 75 | Resp 16 | Ht 65.0 in | Wt 113.0 lb

## 2014-02-24 DIAGNOSIS — R198 Other specified symptoms and signs involving the digestive system and abdomen: Secondary | ICD-10-CM

## 2014-02-24 DIAGNOSIS — I714 Abdominal aortic aneurysm, without rupture, unspecified: Secondary | ICD-10-CM

## 2014-02-24 MED ORDER — IOHEXOL 350 MG/ML SOLN
80.0000 mL | Freq: Once | INTRAVENOUS | Status: AC | PRN
  Administered 2014-02-24: 80 mL via INTRAVENOUS

## 2014-02-24 NOTE — Assessment & Plan Note (Signed)
Her aneurysm was 5.7 cm in maximum diameter by ultrasound back in January. It is increased in size to 6.7 cm by CT scan.  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 She is 80 years old with multiple comorbidities including COPD and chronic kidney disease. She also has dementia. She resides in a nursing home. The daughter and I have had lengthy discussions about her situation both agree that she is not a candidate for open repair. She will keep her appointment in January when she is scheduled for follow up ultrasound. She knows to call sooner she has problems. I reassured her that her CT scan did not show any evidence of a leak of her aneurysm and I did not think her right-sided abdominal pain was related to her aneurysm.

## 2014-02-24 NOTE — Progress Notes (Signed)
[facility]  Patient name: [name] MRN: [id number] DOB: October 06, 1926 Sex: female  REASON FOR VISIT: Follow up of abdominal aortic aneurysm.  HPI: [name] is a 80 y.o. female who I last saw on 06/17/2013. She has a history of a juxtarenal aneurysm. At the time of her last visit, the maximum diameter by ultrasound was 5.75 cm. She is not a candidate for EVAR. Given her age, and declining cognitive function, the family did not wish to consider surgery for aneurysm and I thought this was perfectly reasonable. Arrange for a follow up ultrasound in one year and plan on seeing her back at that time.  The patient had fallen and had some abdominal pain and hip pain and this prompted a CT scan. The CT scan showed that the aneurysm had enlarged somewhat there was no evidence of leak. She did have multiple fractures however has a long history of falls with her previous hip fracture and previous compression fractures of her back.  Past Medical History  Diagnosis Date  . Hypertension   . Memory loss   . Anemia   . Chronic back pain   . Chronic kidney disease   . Anxiety   . Panic attack   . Chronic respiratory failure 04/30/2012  . COPD (chronic obstructive pulmonary disease)   . AAA (abdominal aortic aneurysm)   . Cancer     skin Left wrist BCC  . DVT (deep venous thrombosis)   . Aortic aneurysm, abdominal   . Back injury     has had broken back x 2  . Knee problem     Right knee pain  . Arm fracture, right   . Clostridium difficile enteritis 02/2013    treated   . PMB (postmenopausal bleeding) 03/12/2013  . Depression   . Dementia   . Fall at nursing home Aug. 2015    [facility]   Family History  Problem Relation Age of Onset  . Other Mother   . AAA (abdominal aortic aneurysm) Mother   . Heart attack Mother   . Bladder Cancer Sister   . Breast cancer Sister   . Lung cancer Sister   . Colon cancer Brother   . Colon cancer Brother   .  Hyperlipidemia Father   . Hypertension Father   . Heart disease Father     Heart Disease before age 68   SOCIAL HISTORY: History  Substance Use Topics  . Smoking status: Former Smoker -- 1.00 packs/day for 63 years    Types: Cigarettes    Quit date: 04/25/2012  . Smokeless tobacco: Never Used     Comment: PATIENT QUIT AFTER FALL ON 04/2012  . Alcohol Use: No   Allergies  Allergen Reactions  . Biaxin [Clarithromycin]   . Erythromycin   . Keflex [Cephalexin]   . Macrodantin [Nitrofurantoin Macrocrystal]   . Penicillins   . Sulfa Antibiotics    Current Outpatient Prescriptions  Medication Sig Dispense Refill  . acetaminophen (TYLENOL) 500 MG tablet Take 500 mg by mouth every 6 (six) hours as needed for pain.      Marland Kitchen albuterol (2.5 MG/3ML) 0.083% NEBU 3 mL, albuterol (5 MG/ML) 0.5% NEBU 0.5 mL Take 1 mEq by nebulization every 2 (two) hours as needed (for wheezing and sob).      . Amino Acids-Protein Hydrolys (FEEDING SUPPLEMENT, PRO-STAT SUGAR FREE 64,) LIQD Take 30 mLs by mouth 3 (three) times daily with meals. For nutritional supplement      .  amLODipine (NORVASC) 5 MG tablet Take 5 mg by mouth daily.      . budesonide (PULMICORT) 0.25 MG/2ML nebulizer solution Take 2 mLs (0.25 mg total) by nebulization 2 (two) times daily.  60 mL  12  . cholecalciferol (VITAMIN D) 1000 UNITS tablet Take 1,000 Units by mouth daily.      . cyanocobalamin (,VITAMIN B-12,) 1000 MCG/ML injection Inject 1,000 mcg into the muscle every 30 (thirty) days.      Marland Kitchen donepezil (ARICEPT) 10 MG tablet Take 10 mg by mouth at bedtime.      Marland Kitchen escitalopram (LEXAPRO) 5 MG tablet Take 5 mg by mouth daily. For depression      . feeding supplement (ENSURE COMPLETE) LIQD Take 237 mLs by mouth 2 (two) times daily between meals.      . folic acid (FOLVITE) 1 MG tablet Take 1 mg by mouth daily.      . Multiple Vitamin (MULTIVITAMIN WITH MINERALS) TABS Take 1 tablet by mouth daily.      Marland Kitchen omeprazole (PRILOSEC) 20 MG capsule  Take 20 mg by mouth 2 (two) times daily before a meal. For GERD      . potassium chloride SA (K-DUR,KLOR-CON) 20 MEQ tablet Take 40 mEq by mouth daily.      . traMADol (ULTRAM) 50 MG tablet Take one tablet by mouth every 6 hours as needed for pain  120 tablet  5  . traMADol (ULTRAM) 50 MG tablet Take 1 tablet (50 mg total) by mouth every 6 (six) hours as needed.  25 tablet  0   No current facility-administered medications for this visit.   REVIEW OF SYSTEMS: Valu.Nieves ] denotes positive finding; [  ] denotes negative finding  CARDIOVASCULAR:  [ ]  chest pain   [ ]  chest pressure   [ ]  palpitations   [ ]  orthopnea   [ ]  dyspnea on exertion   [ ]  claudication   [ ]  rest pain   [ ]  DVT   [ ]  phlebitis PULMONARY:   [ ]  productive cough   [ ]  asthma   [ ]  wheezing NEUROLOGIC:   [ ]  weakness  [ ]  paresthesias  [ ]  aphasia  [ ]  amaurosis  [ ]  dizziness HEMATOLOGIC:   [ ]  bleeding problems   [ ]  clotting disorders MUSCULOSKELETAL:  [ ]  joint pain   [ ]  joint swelling [ ]  leg swelling GASTROINTESTINAL: [ ]   blood in stool  [ ]   hematemesis GENITOURINARY:  [ ]   dysuria  [ ]   hematuria PSYCHIATRIC:  [ ]  history of major depression INTEGUMENTARY:  [ ]  rashes  [ ]  ulcers CONSTITUTIONAL:  [ ]  fever   [ ]  chills  PHYSICAL EXAM: Filed Vitals:   02/24/14 1220  BP: 97/50  Pulse: 75  Resp: 16  Height: 5\' 5"  (1.651 m)  Weight: 113 lb (51.256 kg)  SpO2: 94%   Body mass index is 18.8 kg/(m^2). GENERAL: The patient is a well-nourished female, in no acute distress. The vital signs are documented above. CARDIOVASCULAR: There is a regular rate and rhythm.  PULMONARY: There is good air exchange bilaterally without wheezing or rales. ABDOMEN: Soft and non-tender with normal pitched bowel sounds. Her aneurysm is nontender. MUSCULOSKELETAL: There are no major deformities or cyanosis. NEUROLOGIC: No focal weakness or paresthesias are detected. SKIN: There are no ulcers or rashes noted. PSYCHIATRIC: The patient has a  normal affect.  DATA:  CT ANGIOGRAM ABDOMEN AND PELVIS: CT on 02/24/2014 shows that  the ascending aorta measures 3.6 cm in maximum diameter. Her juxtarenal aneurysm measures 6.7 cm in maximum diameter. This has enlarged compared to her previous study. She is also noted to have entered will development of a compression deformity involving the L4 vertebral body. There is also a minimally displaced and slightly comminuted fracture of the left ischial tuberosity.  MEDICAL ISSUES:   Abdominal aneurysm without mention of rupture Her aneurysm was 5.7 cm in maximum diameter by ultrasound back in January. It is increased in size to 6.7 cm by CT scan.  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 She is 80 years old with multiple comorbidities including COPD and chronic kidney disease. She also has dementia. She resides in a nursing home. The daughter and I have had lengthy discussions about her situation both agree that she is not a candidate for open repair. She will keep her appointment in January when she is scheduled for follow up ultrasound. She knows to call sooner she has problems. I reassured her that her CT scan did not show any evidence of a leak of her aneurysm and I did not think her right-sided abdominal pain was related to her aneurysm.   [facility] [place] Beeper: [phone]

## 2014-03-11 ENCOUNTER — Encounter: Payer: Self-pay | Admitting: *Deleted

## 2014-03-16 ENCOUNTER — Ambulatory Visit (INDEPENDENT_AMBULATORY_CARE_PROVIDER_SITE_OTHER): Payer: Medicare Other | Admitting: Gastroenterology

## 2014-03-16 ENCOUNTER — Encounter: Payer: Self-pay | Admitting: Gastroenterology

## 2014-03-16 ENCOUNTER — Other Ambulatory Visit: Payer: Self-pay

## 2014-03-16 VITALS — BP 103/76 | HR 73 | Temp 97.2°F | Ht 65.0 in

## 2014-03-16 DIAGNOSIS — R634 Abnormal weight loss: Secondary | ICD-10-CM

## 2014-03-16 DIAGNOSIS — R1319 Other dysphagia: Secondary | ICD-10-CM

## 2014-03-16 DIAGNOSIS — K449 Diaphragmatic hernia without obstruction or gangrene: Secondary | ICD-10-CM

## 2014-03-16 DIAGNOSIS — R1310 Dysphagia, unspecified: Secondary | ICD-10-CM | POA: Insufficient documentation

## 2014-03-16 DIAGNOSIS — K2100 Gastro-esophageal reflux disease with esophagitis, without bleeding: Secondary | ICD-10-CM

## 2014-03-16 DIAGNOSIS — K219 Gastro-esophageal reflux disease without esophagitis: Secondary | ICD-10-CM

## 2014-03-16 DIAGNOSIS — R1314 Dysphagia, pharyngoesophageal phase: Secondary | ICD-10-CM

## 2014-03-16 NOTE — Patient Instructions (Signed)
1. Barium esophagram to evaluate swallowing difficulty. We will also have them look at your stomach at the same time to make sure no ulcer, etc. 2. Please check with speech therapy and see if it is okay to have the patient drink WITHOUT straws.  3. Continue omeprazole 20mg  twice a day.

## 2014-03-16 NOTE — Progress Notes (Signed)
cc'ed to pcp °

## 2014-03-16 NOTE — Assessment & Plan Note (Signed)
80 year old female with history of mild dementia, juxtarenal abdominal aortic aneurysm, compression fractures of the spine/rib fractures/hip fracture last year/C. difficile last year who presents for decline in appetite, 20 pound weight loss, concern for difficulty swallowing. Patient is noted to swallow significant air while eating per daughter. She drinks from a straw. She has significant belching. Seems to get full very quickly and then stops eating. Does not appear to be in any pain. Recent CTA shows large hiatal hernia, constipation. Discussed at length with daughter. She does not want to pursue invasive testing. We will plan on upper GI series with barium pill esophagram. Reports current bowel regimen seems to be working. Recently increased omeprazole to twice a day which they should continue. Will have speech therapy consider whether or not a straw as necessary causes could help eliminate swallowing air if it's avoided. Further recommendations to follow.

## 2014-03-16 NOTE — Progress Notes (Signed)
Primary Care Physician:  Marjorie Smolder, MD  Primary Gastroenterologist:  Garfield Cornea, MD   Chief Complaint  Patient presents with  . Dysphagia  . Gastrophageal Reflux    HPI:  [name] is a 80 y.o. female here with her daughter today to discuss swallowing issues, weight loss. Patient has lost over 20 pounds in the past year in setting of hip fracture, rib fractures, C.Diff. Daughter provided the history today.   Chronically has had symptoms of swallowing air, burping, and then cannot finish meal. Does not appear to be in pain with meals. Curvature of spine from spinal fractures. Position while eating seems to help. Does not want invasive testing. Cracked couple of ribs recently as well.  Decreased in appetite. Tendency with constipation. No melena, brbpr reported. Cdiff last winter, took awhile to get rid off. H/O expanding juxtarenal AAA. Due to patient age and declining cognitive function, the family did not wish to consider surgery.    Recent CTA showed constipation and large hiatal hernia. Patient drinks from a straw most of the time.  Current Outpatient Prescriptions on File Prior to Visit  Medication Sig Dispense Refill  . acetaminophen (TYLENOL) 500 MG tablet Take 500 mg by mouth every 6 (six) hours as needed for pain.      Marland Kitchen albuterol (2.5 MG/3ML) 0.083% NEBU 3 mL, albuterol (5 MG/ML) 0.5% NEBU 0.5 mL Take 1 mEq by nebulization every 2 (two) hours as needed (for wheezing and sob).      . Amino Acids-Protein Hydrolys (FEEDING SUPPLEMENT, PRO-STAT SUGAR FREE 64,) LIQD Take 30 mLs by mouth 3 (three) times daily with meals. For nutritional supplement      . Balsam [place]-Castor Oil (VENELEX) OINT Apply topically 3 (three) times daily as needed.      . budesonide (PULMICORT) 0.25 MG/2ML nebulizer solution Take 2 mLs (0.25 mg total) by nebulization 2 (two) times daily.  60 mL  12  . cyanocobalamin (,VITAMIN B-12,) 1000 MCG/ML injection Inject 1,000 mcg into the muscle every 30  (thirty) days.      Marland Kitchen donepezil (ARICEPT) 10 MG tablet Take 10 mg by mouth at bedtime.      . feeding supplement (ENSURE COMPLETE) LIQD Take 237 mLs by mouth 2 (two) times daily between meals.      . folic acid (FOLVITE) 1 MG tablet Take 1 mg by mouth daily.      . Multiple Vitamin (MULTIVITAMIN WITH MINERALS) TABS Take 1 tablet by mouth daily.      . potassium chloride SA (K-DUR,KLOR-CON) 20 MEQ tablet Take 40 mEq by mouth daily.      . traMADol (ULTRAM) 50 MG tablet Take one tablet by mouth every 6 hours as needed for pain  120 tablet  5  . omeprazole (PRILOSEC) 20 MG capsule Take 20 mg by mouth 2 (two) times daily before a meal. For GERD      Depakote 125 mg at bedtime Lorazepam 0.25 mg every 8 hours as needed Magnesium hydroxide when necessary constipation memantine 28 mg daily Remeron 7.5 mg at bedtime   Allergies as of 03/16/2014 - Review Complete 03/16/2014  Allergen Reaction Noted  . Biaxin [clarithromycin]  04/27/2012  . Erythromycin  04/27/2012  . Keflex [cephalexin]  10/21/2011  . Macrodantin [nitrofurantoin macrocrystal]  04/27/2012  . Penicillins  04/27/2012  . Sulfa antibiotics  04/27/2012    Past Medical History  Diagnosis Date  . Hypertension   . Memory loss   . Anemia   . Chronic  back pain   . Chronic kidney disease   . Anxiety   . Panic attack   . Chronic respiratory failure 04/30/2012  . COPD (chronic obstructive pulmonary disease)   . AAA (abdominal aortic aneurysm)   . Cancer     skin Left wrist BCC  . DVT (deep venous thrombosis)   . Aortic aneurysm, abdominal   . Back injury     has had broken back x 2  . Knee problem     Right knee pain  . Arm fracture, right   . Clostridium difficile enteritis 02/2013    treated   . PMB (postmenopausal bleeding) 03/12/2013  . Depression   . Dementia   . Fall at nursing home Aug. 2015    [facility]    Past Surgical History  Procedure Laterality Date  . Hemorrhoid surgery    . Hip arthroplasty  Right 01/08/2013    Procedure: ARTHROPLASTY BIPOLAR HIP;  Surgeon: Nita Sells, MD;  Location: [facility];  Service: Orthopedics;  Laterality: Right;  . Fracture surgery Right January 08, 2013    Hip    Family History  Problem Relation Age of Onset  . Other Mother   . AAA (abdominal aortic aneurysm) Mother   . Heart attack Mother   . Bladder Cancer Sister   . Breast cancer Sister   . Lung cancer Sister   . Colon cancer Brother   . Colon cancer Brother   . Hyperlipidemia Father   . Hypertension Father   . Heart disease Father     Heart Disease before age 47    History   Social History  . Marital Status: Married    Spouse Name: N/A    Number of Children: 3  . Years of Education: N/A   Occupational History  . retired   .     Social History Main Topics  . Smoking status: Former Smoker -- 1.00 packs/day for 63 years    Types: Cigarettes    Quit date: 04/25/2012  . Smokeless tobacco: Never Used     Comment: PATIENT QUIT AFTER FALL ON 04/2012  . Alcohol Use: No  . Drug Use: No  . Sexual Activity: No   Other Topics Concern  . Not on file   Social History Narrative  . No narrative on file      ROS: could not obtain from patient, limited by dementia     Physical Examination:  BP 103/76  Pulse 73  Temp(Src) 97.2 F (36.2 C) (Oral)  Ht 5\' 5"  (1.651 m)   General: Well-nourished, well-developed in no acute distress. Accompanied by dgt. Head: Normocephalic, atraumatic.   Eyes: Conjunctiva pink, no icterus. Mouth: Oropharyngeal mucosa moist and pink , no lesions erythema or exudate. Neck: Supple without thyromegaly, masses, or lymphadenopathy.  Lungs: Clear to auscultation bilaterally.  Heart: Regular rate and rhythm, no murmurs rubs or gallops.  Abdomen: Bowel sounds are normal, nontender, nondistended, no hepatosplenomegaly or masses, no abdominal bruits or    hernia , no rebound or guarding.  In wheelchair. Rectal: not done. Extremities: No lower  extremity edema. No clubbing or deformities.  Neuro: Alert and oriented x 4 , grossly normal neurologically.  Skin: Warm and dry, no rash or jaundice.   Psych: Alert and cooperative, normal mood and affect.  Imaging Studies: Ct Angio Abd/pel W/ And/or W/o  02/24/2014   CLINICAL DATA:  Evaluate abdominal aortic aneurysm. Recent history of falls.  EXAM: CT ANGIOGRAPHY ABDOMEN AND PELVIS  WITH CONTRAST AND WITHOUT CONTRAST  TECHNIQUE: Multidetector CT imaging of the abdomen and pelvis was performed using the standard protocol during bolus administration of intravenous contrast. Multiplanar reconstructed images including MIPs were obtained and reviewed to evaluate the vascular anatomy.  CONTRAST:  67mL OMNIPAQUE IOHEXOL 350 MG/ML SOLN  COMPARISON:  CT abdomen pelvis - 07/07/2013; 11/26/2012  FINDINGS: Vascular Findings:  Thoracic aorta: There is suspected at least ectasia of the imaged proximal aspect of the ascending thoracic aorta measuring 36 mm in diameter (image 1, series 4).  Abdominal aorta: Continued enlargement of the known juxtarenal abdominal aortic aneurysm, now measuring 6.4 x 6.7 cm in maximal axial dimension (image 52, series 4, previously, 6.2 x 5.8 cm. The aneurysm now measures approximately 6.5 cm in maximal coronal dimension (image 45, series 601, previously, 5.6 cm and approximately 6.3 cm in maximal sagittal diameter (sagittal image 62, series 602), previously, 5.3 cm.  The cranial aspect of the abdominal aorta is again noted to be markedly tortuous. The aneurysm is again noted to arise at the location of the diminutive solitary left renal artery and approximately 1.7 cm caudal to the take-off of the solitary right renal artery. The aneurysm extends to the level of the aortic bifurcation. The amount of crescentic slightly irregular mural thrombus within knee aneurysm has markedly increased in the interval. No definite abdominal aortic dissection or periaortic stranding, though note, evaluation  somewhat degraded secondary to lack of significant amount of mesenteric fat.  Celiac artery: There is a moderate amount of eccentric predominantly calcified plaque involving the origin the celiac artery, not resulting in a hemodynamically significant stenosis. Conventional branching pattern.  SMA: Widely patent without hemodynamically significant narrowing.  Right Renal artery: Solitary; there is a minimal amount of eccentric calcified plaque involving the cranial aspect of the origin of the right renal artery, not resulting in hemodynamically significant stenosis.  Left Renal artery: Solitary; atrophic though patent. The solitary left renal artery is noted to arise at the cranial aspect of the abdominal aortic aneurysm.  IMA: Diseased at its origin the remains patent.  Pelvic vasculature: There is a minimal amount of mixed calcified and noncalcified atherosclerotic plaque within the bilateral common iliac arteries, not resulting in hemodynamically significant stenosis. The bilateral internal iliac arteries are diseased though patent and of normal caliber. The bilateral external iliac arteries appear widely patent without hemodynamically significant narrowing.  Review of the MIP images confirms the above findings.   --------------------------------------------------------------------------------  Nonvascular Findings:  Normal hepatic contour. No discrete hepatic lesions. Normal appearance of the gallbladder given degree distention. No intra or extrahepatic digit duct dilatation. No ascites.  There is symmetric enhancement and excretion of the bilateral kidneys. The left kidney remains asymmetrically atrophic in comparison to the right. Grossly unchanged approximately 2 cm hypoattenuating nonenhancing partially exophytic cyst arising from the inferior pole the right kidney (image 58, series 4). Unchanged approximately 2.0 x 2.8 cm hypo attenuating nonenhancing partially exophytic cyst arising from the superior pole  of the left kidney (image 42, series 4). Additional bilateral sub cm hypo attenuating renal lesions are too small likely characterize of favored to represent additional renal cysts. No definite renal stones this postcontrast examination. No urinary obstruction or perinephric stranding.  There is unchanged mild diffuse thickening of the bilateral adrenal glands without discrete nodule. The pancreas is largely atrophic, unchanged. Normal appearance of the spleen.  Large hiatal hernia. Large stool burden within the colon. No evidence of enteric obstruction. Normal appearance of the retrocecal appendix. No  pneumoperitoneum, pneumatosis or portal venous gas.  No retroperitoneal, mesenteric, pelvic or inguinal lymphadenopathy.  Normal appearance of the pelvic organs for age, though note, evaluation degraded secondary to streak artifact from the right total hip prosthesis.  Limited visualization of the lower thorax is negative for focal airspace opacity or pleural effusion.  Cardiomegaly. Coronary artery calcifications. No pericardial effusion.  Interval development of a moderate (approximately 40%) compression deformity primarily involving the superior endplate of the L4 vertebral body with associated fracture line (representative images 60, 65, 70 and 72) with minimal (approximately 3 mm) of retropulsion of the posterior aspect of the superior endplate of the L4 vertebral body.  There is a minimally displaced slightly comminuted fracture involving the left ischial tuberosity extending to involve the posterior aspect of the left inferior pubic ramus (representative axial images 144, 148 and 153, coronal images 59 and 68).  Unchanged severe (greater 75%) compression deformities involving T12, L1 and L2 vertebral bodies. Post right total hip replacement.  Regional soft tissues appear normal.  IMPRESSION: Vascular Impression:  1. Continued enlargement of juxtarenal abdominal aortic aneurysm now measuring 6.7 cm in diameter,  previously, 6.2 cm. Marked increase in the amount of crescentic slightly irregular mural thrombus within the abdominal aortic aneurysm. No definite evidence of dissection or periaortic stranding. 2. Suspected at least mild ectasia of the imaged proximal aspect of the ascending thoracic aorta measuring 3.6 mm in diameter. Further evaluation could be performed with dedicated chest CTA as clinically indicated. Nonvascular Impression:  1. Interval development of an acute moderate (approximately 40%) compression deformity involving the superior endplate of the L4 vertebral body with associated minimal (approximately 3 mm) of retropulsion. 2. Minimally displaced and slightly comminuted fracture involving the left ischial tuberosity extending to involve the posterior aspect of the left inferior pubic ramus. 3. Grossly unchanged severe (>75%) compression deformities involving the T12, L2 and L3 vertebral bodies. 4. Unchanged asymmetric atrophy of the left kidney. 5. Unchanged large hiatal hernia. These results will be called to the ordering clinician or representative by the Radiologist Assistant, and communication documented in the PACS or zVision Dashboard.   Electronically Signed   By: Sandi Mariscal M.D.   On: 02/24/2014 11:48

## 2014-03-22 ENCOUNTER — Ambulatory Visit (HOSPITAL_COMMUNITY)
Admit: 2014-03-22 | Discharge: 2014-03-22 | Disposition: A | Discharge status: Home / Self Care | Payer: Medicare Other | Attending: Gastroenterology | Admitting: Gastroenterology

## 2014-03-22 ENCOUNTER — Telehealth: Payer: Self-pay | Admitting: Internal Medicine

## 2014-03-22 ENCOUNTER — Ambulatory Visit (HOSPITAL_COMMUNITY)
Admit: 2014-03-22 | Discharge: 2014-03-22 | Disposition: A | Discharge status: Home / Self Care | Payer: Medicare Other | Source: Ambulatory Visit | Attending: Gastroenterology | Admitting: Gastroenterology

## 2014-03-22 DIAGNOSIS — R1310 Dysphagia, unspecified: Secondary | ICD-10-CM

## 2014-03-22 DIAGNOSIS — R634 Abnormal weight loss: Secondary | ICD-10-CM | POA: Insufficient documentation

## 2014-03-22 DIAGNOSIS — K2100 Gastro-esophageal reflux disease with esophagitis, without bleeding: Secondary | ICD-10-CM

## 2014-03-22 DIAGNOSIS — K219 Gastro-esophageal reflux disease without esophagitis: Secondary | ICD-10-CM | POA: Insufficient documentation

## 2014-03-22 DIAGNOSIS — K449 Diaphragmatic hernia without obstruction or gangrene: Secondary | ICD-10-CM

## 2014-03-22 DIAGNOSIS — R1319 Other dysphagia: Secondary | ICD-10-CM

## 2014-03-22 NOTE — Telephone Encounter (Signed)
Pt seen recently by LSL and daughter called today to let us know that patient could not do the barium xray. She has dementia and is hard to reason with. Daughter said unless it was a last resort we could have her sedated, but right now she wants to hold off on doing anymore testing due to patient's age and dementia.

## 2014-03-22 NOTE — Telephone Encounter (Signed)
Noted. Will address under esophagus xray.

## 2014-03-22 NOTE — Telephone Encounter (Signed)
Routing to LSL 

## 2014-03-30 NOTE — Progress Notes (Signed)
Quick Note:  Please let patient's daughter know her BPE was limited. She is aware of incomplete study due to patient cooperation. They could tell that there are some spasms in the distal esophagus/large hh.  Suspect most of her symptoms related to hh. Recommend 5-6 small meals daily given large hh. Do not overeat (not likely a problem for this patient), continue omeprazole 20mg  BID, avoid swallowing air/straw use.   Please touch base with daughter and verify that she does not want to pursue further testing as per Susan's last telephone note. Offer f/u OV with RMR. ______

## 2014-04-06 ENCOUNTER — Telehealth: Payer: Self-pay

## 2014-04-06 NOTE — Progress Notes (Signed)
[facility] NOTIFIED

## 2014-04-06 NOTE — Telephone Encounter (Signed)
Spoke with the pts daughter about the BPE results. She wanted me to verify with the [facility] that she was getting ensure every day. They said she gets ensure tid and a magic cup tid.   Pt is currently on a soft mechanical diet and the daughter wants to know if LSL thought she would benefit from a pureed diet? Please advise.

## 2014-04-07 NOTE — Telephone Encounter (Signed)
I would recommend she discuss change in diet with nursing home provider or speech. The may reason for switching to puree would be primarily for difficulty chewing, etc. If she is having this problem then the change should be made.

## 2014-04-07 NOTE — Telephone Encounter (Signed)
I spoke with the pts daughter, she is going to call the nutritionist and discuss everything with them.

## 2014-04-12 ENCOUNTER — Encounter: Payer: Self-pay | Admitting: Gastroenterology

## 2014-04-13 ENCOUNTER — Non-Acute Institutional Stay (SKILLED_NURSING_FACILITY): Payer: Medicare Other | Admitting: Internal Medicine

## 2014-04-13 ENCOUNTER — Encounter: Payer: Self-pay | Admitting: Internal Medicine

## 2014-04-13 DIAGNOSIS — R748 Abnormal levels of other serum enzymes: Secondary | ICD-10-CM

## 2014-04-13 DIAGNOSIS — K219 Gastro-esophageal reflux disease without esophagitis: Secondary | ICD-10-CM

## 2014-04-13 DIAGNOSIS — F0391 Unspecified dementia with behavioral disturbance: Secondary | ICD-10-CM

## 2014-04-13 DIAGNOSIS — I714 Abdominal aortic aneurysm, without rupture, unspecified: Secondary | ICD-10-CM

## 2014-04-13 DIAGNOSIS — J439 Emphysema, unspecified: Secondary | ICD-10-CM

## 2014-04-13 DIAGNOSIS — I10 Essential (primary) hypertension: Secondary | ICD-10-CM

## 2014-04-13 DIAGNOSIS — S72001D Fracture of unspecified part of neck of right femur, subsequent encounter for closed fracture with routine healing: Secondary | ICD-10-CM

## 2014-04-13 NOTE — Progress Notes (Signed)
Patient ID: [name], female   DOB: [birth year], 80 y.o.   MRN: [id number]   Facility; [facility]  This is a routine visit-  .  Chief complaint;--medical management of right hip fracture status post repair-COPD-renal insufficiency-dementia-GERD  History of present illness--; this is an 80 year old woman who came to [place] after suffering a right hip fracture she underwent a right hip hemiarthroplasty. She has a history of COPD. Other issues include dementia, tricuspid regurgitation, and chronic renal failure stage II .  Also has a history of recurrent C. difficile although this has been stable for some time.  Most recent acute episodes appear to revolve around a questionable syncopal episode-check she was sent to the ER so after sustaining a fall out of her wheelchair which was apparently suspicious for possible syncopal episode-initially she was somewhat pale with irregular heartbeat-ER evaluation did not really show anything acute and actually she saw cardiolgyt ea who did discontinue her Norvasc but as far as doing an aggressive workup did not think at this point was needed especially with her comorbidities-however there was some concern with hypotension and the Norvasc was discontinued t.   .  She also has had a history of vaginal bleeding this has been evaluated by GYN and they have determined at this point no further followup certainly follow up as needed--there has been no reoccurrence to my knowledge.  She was sent to the ER earlier this year a with complaints of acute abdominal pain-however workup there did not show any acute process the scan did show a large hiatal hernia and a  aortic aneurysm-she was recommendation for Carafate before meals to help with the abdominal discomfort and this appears to have helped - She has seen vascular-they did note the abdominal aortic aneurysm was now 6.7 cm which is somewhat increased from the previous reading of 5.7-however according to vascular note  a discussion was done with the family and recommendation was more conservative follow-up here since patient would be a poor risk for any surgery  She does have a history of COPD this has been stable for some time and has not been an issue for some time she is on albuterol nebulizers as needed.  Also has a history of anemia she is on iron    Patient appears to have lost some weight it appears over the summer lost about 7 pounds between late June to October---- she is on numerous supplements --she is on Magic cup as well as ensure  However according to nursing staff and family her appetite continues to be spotty--she has seen GI they did attempt  barium esophagram.which showed a large hiatal hernia-and some spasms in the distal esophagus-her Prilosec was increased to twice a day with recommendation not to use a straw and didn't take small bites.  Since her fall family believes patient's dementia has increased they would like a further study radiology of the head to rule out any acute pathology and we will do that--she does appear to have more behaviors agitation episode she has been evaluated by psychiatric services Today they did increase her Depakote to twice a day-she is also on Seroquel as well as Namenda and Aricept  .   -.  Family medical social history reviewed per history and physical on 01/12/2013  .  Medications have been reviewed per MAR.  Marland Kitchen  Review of systems; somewhat limited secondary to dementia --largely obtained from nursing.  General denies any fever or chills--j Eyes-she has prescription lenses does not  complaining of visual changes.  Ears nose mouth and throat-he is somewhat hard of hearing does not complaining of any sore throat.  Respiratory no cough or excess short of breath --history of COPD  Cardiac no chest pain or significant edema  GI-as does not complaining of any abdominal pain nausea or vomiting-no recent diarrhea to my knowledge which is  encouraging GU-is not complaining of any dysuria  t  .  Muscle skeletal status post right hip fracture with repair-she is not complaining of pain.  Neurologic-no complaints of headache or dizziness-.  Psych-does have a history of dementia --appears to be having more behaviors and confusion gradual onset    Physical exam;    Temperature 98.3 pulse 6 respirations 20 blood pressures variable 147/88-117/64-137/90-weight is 109.8  .  In general is a pleasant elderly female in no distress comfortably in her wheelchair  Her skin is warm and dry she does have venous stasis changes of her shins bilaterally and also some old bruising of her lower arms bilaterally.  Eyes--limited exam since patient did not really follow commands--- pupils appear  equal round reactive to light sclera and conjunctiva are clear extraocular movements intact visual acuity appears intact she does have prescription lenses.  Oropharynx clear mucous membranes moist.  Chest is clear to auscultation bilaterally with somewhat reduced air entry no labored breathing.  Cardiac Ir there is no evidence of CHFs regular rate and rhythm with some skipped beats at times  Abdominal exam it is soft nontender with active bowel sounds  Extremities; significant venous stasis but no evidence of a DVT--moves all extremities x4--no significant edema Muscle skeletal-she is significantly K phonic  Neurologic-grossly intact her speech is clear.--I do not see any lateralizing findings cranial nerves are intact  Psych she is oriented to self somewhat confused and a bit more agitated-according nursing staff this appears to be a gradual change-she does follow some verbal commands  Labs  02/18/2014.  WBC 7.8 hemoglobin 12.6 platelets 214.  Sodium 142 potassium 4.1 BUN 20 creatinine 1.07.  02/16/2014.  GGT 26.  02/10/2014. Alkaline phosphatase 285.[id number] 1716  Albumin 2.5  12/09/2013.  WBC 7.3 hemoglobin 14.0  platelets 245.  Sodium 139 potassium 4.1 BUN 21 creatinine 0.90.   Liver function tests unremarkable except for albumin of 2.8 and alkaline phosphatase 162  TSH-1.716   10/30/2013.  WBC 6.3 hemoglobin 14.5 platelets 229.  Sodium 137 potassium 4 BUN 16 creatinine 0.94.  Liver function tests within normal limits except alkaline phosphatase of 168 albumin of 3.3  08/24/2013.  Hemoglobin A1c 5.5.  WBC 6.7 hemoglobin 11.7 platelets 213.  Sodium 139 potassium 3.6 BUN 15 creatinine 0.87.  Liver function tests within normal limits except alkaline phosphatase 131 albumin of 2.5  07/07/2013.  Sodium 135 potassium 4.6 BUN 17 creatinine 1.18  06/10/2013.  WBC 10.4 hemoglobin 12.3 platelets 263.  Sodium 138 potassium 4 BUN 13 creatinine 0.96  03/30/2013.  WBC 6.9 hemoglobin 11.5 platelets 212.  Sodium 138 potassium 3.8 BUN 17 creatinine 1.01.  .  02/16/2013.  Fasting glucose 77.  02/05/2013.  WBC 7.0 hemoglobin 10.6 platelets 207.  Sodium 139 potassium 4 BUN 28 creatinine 1.13.  02/12/2013.  BNP-450.9.  01/14/2013.  Liver function tests within normal limits albumin of 2.3  Impression/plan  #1-history of recurrent C. Difficile-this now has been stable for some time which is encouraging-  .  #2  Right hip fracture; she appears to be stable.-pain is well controlled--he does have orders for Ultram as well as  Tylenol when necessary.  #3..  Chronic renal insufficiency this appears to be stable  -Will update lab   Number 4  Severe hypoalbuminemia --after initial improvement this appears to have gone down-mostrecently 2.5 in early September will update this-- is on supplementation   #5  Anemia hemoglobin appears stable--will update CBC  l  Number 6  Suspect moderate to severe dementia--this appears to be progressing she is seen by psychiatric services is now on Aricept as well as Namenda-Depakote twice a day and Seroquel-family is  concerned about any possible acute etiology involving her brain-will obtain a CT scan of her head--this was discussed with her family --responsible party via phone  -  Number 7.  History COPD-she appears to be stable in this regards continues on Pulmicort as well as albuterol nebulizers  #8  Hypertension-  At this point will monitor again cardiology has discontinued her Norvasc secondary to occasional hypotension concerns-I do not see consistent elevations per chart review I suspect some elevations may be due to agitation   #9  History of vaginal bleeding-I previously reviewed records from GYN-a pelvic ultrasound was done which apparently did not show any acute issues it did show symmetrical endometrium did show multiple calcifications and small ovaries-per notes from GYN on October 7--2014 --only followup when necessary--at this point there has been no further episodes of this to my knowledge   #11  Some history of occult positive stool-this may have secondary to her C. difficile infection in the past-her hemoglobin remained stable she is on iron as well as a proton pump inhibitor--will Update--  #12-history of abdominal aortic aneurysm-she did have an ultrasound during an ER visit back in January that showed relative stability - --however one done a couple months ago showed that a large somewhat to 6.7 cm-vascular surgery has evaluated this and has noted above she is not a good surgical candidate and family is in agreement with watching this  #13-recent?? syncopal episode--I. have reviewed the cardiology not again conservative followup has been recommended as long as she is stable-her Norvasc has been discontinued secondary to possible concerns of transitory hypotension at times-at this point will monitor.  #14-weight loss- She continues on supplements she did have a barium study ordered as noted above which showed a large hiatal hernia some esophageal spasms recommendation by  GI was small bites avoid using her straw and increase Prilosec to twice a day-at this point will monitor a challenging situation I suspect her progressing dementia is contributing to this  #15-have noted an increased alkaline phosphatase-GGT was normal protein electrophoresis was done and reviewed by Dr. Dellia Nims at this point we'll monitor since patient has numerous comorbidities and does not appear to be symptomatic-she would be a poor candidate for any aggressive treatment-and this was discussed with Dr. Dellia Nims via phone previously--I also discussed this with her responsible party via phone today-and she is in agreement with this.  Will update her liver function tests     #16-some history of hypokalemia-she is on supplementation will have to update metabolic panel   [id number] note greater than 45 minutes spent assessing patient-reviewing her medical history-and coordinating in formulating a plan of care for numerous diagnoses-of note greater than 50% of time spent coordinating plan of care

## 2014-04-14 ENCOUNTER — Ambulatory Visit (HOSPITAL_COMMUNITY)
Admit: 2014-04-14 | Discharge: 2014-04-14 | Disposition: A | Discharge status: Skilled Nursing Facility | Payer: Medicare Other | Source: Skilled Nursing Facility | Attending: Internal Medicine | Admitting: Internal Medicine

## 2014-04-14 DIAGNOSIS — R451 Restlessness and agitation: Secondary | ICD-10-CM | POA: Insufficient documentation

## 2014-04-14 DIAGNOSIS — R410 Disorientation, unspecified: Secondary | ICD-10-CM | POA: Insufficient documentation

## 2014-04-14 DIAGNOSIS — Z9181 History of falling: Secondary | ICD-10-CM | POA: Insufficient documentation

## 2014-04-14 DIAGNOSIS — I6782 Cerebral ischemia: Secondary | ICD-10-CM | POA: Diagnosis not present

## 2014-04-14 DIAGNOSIS — G319 Degenerative disease of nervous system, unspecified: Secondary | ICD-10-CM | POA: Diagnosis not present

## 2014-04-14 DIAGNOSIS — Z87828 Personal history of other (healed) physical injury and trauma: Secondary | ICD-10-CM | POA: Diagnosis not present

## 2014-04-22 ENCOUNTER — Ambulatory Visit (HOSPITAL_COMMUNITY): Payer: Medicare Other

## 2014-04-22 ENCOUNTER — Inpatient Hospital Stay (HOSPITAL_COMMUNITY)
Admit: 2014-04-22 | Discharge: 2014-04-22 | Disposition: A | Discharge status: Home / Self Care | Payer: Medicare Other | Attending: Internal Medicine | Admitting: Internal Medicine

## 2014-04-22 ENCOUNTER — Ambulatory Visit (HOSPITAL_COMMUNITY): Payer: Medicare Other | Attending: Internal Medicine

## 2014-04-22 DIAGNOSIS — W19XXXA Unspecified fall, initial encounter: Secondary | ICD-10-CM | POA: Insufficient documentation

## 2014-04-22 DIAGNOSIS — R413 Other amnesia: Secondary | ICD-10-CM | POA: Diagnosis present

## 2014-04-22 DIAGNOSIS — I10 Essential (primary) hypertension: Secondary | ICD-10-CM | POA: Insufficient documentation

## 2014-05-21 ENCOUNTER — Ambulatory Visit: Payer: Medicare Other | Admitting: Internal Medicine

## 2014-06-06 ENCOUNTER — Inpatient Hospital Stay (HOSPITAL_COMMUNITY)
Admit: 2014-06-06 | Discharge: 2014-06-06 | Disposition: A | Discharge status: Home / Self Care | Payer: Medicare Other | Attending: Internal Medicine | Admitting: Internal Medicine

## 2014-06-06 ENCOUNTER — Non-Acute Institutional Stay (SKILLED_NURSING_FACILITY): Payer: Medicare Other | Admitting: Internal Medicine

## 2014-06-06 ENCOUNTER — Encounter: Payer: Self-pay | Admitting: Internal Medicine

## 2014-06-06 DIAGNOSIS — I714 Abdominal aortic aneurysm, without rupture, unspecified: Secondary | ICD-10-CM

## 2014-06-06 DIAGNOSIS — I10 Essential (primary) hypertension: Secondary | ICD-10-CM

## 2014-06-06 DIAGNOSIS — J439 Emphysema, unspecified: Secondary | ICD-10-CM

## 2014-06-06 DIAGNOSIS — M25511 Pain in right shoulder: Secondary | ICD-10-CM

## 2014-06-06 DIAGNOSIS — M25519 Pain in unspecified shoulder: Secondary | ICD-10-CM | POA: Insufficient documentation

## 2014-06-06 DIAGNOSIS — F0391 Unspecified dementia with behavioral disturbance: Secondary | ICD-10-CM

## 2014-06-06 DIAGNOSIS — R269 Unspecified abnormalities of gait and mobility: Secondary | ICD-10-CM | POA: Insufficient documentation

## 2014-06-06 NOTE — Progress Notes (Signed)
Patient ID: [name], female   DOB: 1926/11/27, 80 y.o.   MRN: [id number]   ,     :        Facility; [facility]  This is a routine--acute visit-  .  Chief complaint;--medical management of right hip fracture status post repair-COPD-renal insufficiency-dementia-GERD Acute visit status post fall     History of present illness  --; this is an 80 year old woman who came to [place] after suffering a right hip fracture she underwent a right hip hemiarthroplasty. She has a history of COPD. Other issues include dementia, tricuspid regurgitation, and chronic renal failure stage II .  Also has a history of recurrent C. difficile although this has been stable for some time  Apparently she had an unwitnessed fall in the day room this afternoon-she was found lying on her right side-initially she complained of some right shoulder pain otherwise did not complain of any discomfort-x-rays were obtained which did not show anything acute of the right humerus or shoulder -- did show mild degenerative changes of theAC joint   Otherwise----. Most recent acute episodes appear to revolve around a questionable syncopal episode-cshe was sent to the ER so after sustaining a fall out of her wheelchair which was apparently suspicious for possible syncopal episode-initially she was somewhat pale with irregular heartbeat-ER evaluation did not really show anything acute and actually she saw cardiolgy  who did discontinue her Norvasc but as far as doing an aggressive workup did not think at this point was needed especially with her comorbidities-however there was some concern with hypotension and the Norvasc was discontinued t recent blood pressures appear to run in the 440N to 027O systolically this evening 140/84-I also checked again and got somewhat higher around 536 systolically over 90.  Apparently there is some variability I also see listed 137/81 previously   .  She also has had a history of vaginal  bleeding this has been evaluated by GYN and they have determined at this point no further followup certainly follow up as needed--there has been no reoccurrence to my knowledge.  She was sent to the ER earlier this year a with complaints of acute abdominal pain-however workup there did not show any acute process the scan did show a large hiatal hernia and a  aortic aneurysm-she was recommendation for Carafate before meals to help with the abdominal discomfort and this appears to have helped --at the time but it does not appear she is taking this anymore However she is on Prilosec twice a day for GERD-like symptoms  --- She has seen vascular-they did note the abdominal aortic aneurysm was now 6.7 cm which is somewhat increased from the previous reading of 5.7-however according to vascular note a discussion was done with the family and recommendation was more conservative follow-up here since patient would be a poor risk for any surgery--she saw vascular back in September 2015-and apparently an updated ultrasound and appointment has been scheduled for next month--   She does have a history of COPD this has been stable for some time and has not been an issue for some time she is on albuterol nebulizers as needed.  Also has a history of anemia --at one point was on iron    When I saw her last month appeared she lost some weight-however this appears to have stabilized-she is on supplements.  Last month her family was concerned about some increased confusion and behaviors-CT scan did not show any acute process or changes  She is followed by psychiatric services and is on Depakote at [facility]-she is also on Seroquel as well as Namenda and Aricept  .   -.  Family medical social history reviewed per history and physical on 01/12/2013  .  Medications have been reviewed per MAR.  Marland Kitchen  Review of systems; somewhat limited secondary to dementia --largely obtained from nursing.  General denies any  fever or chills--j Eyes-she has prescription lenses does not complaining of visual changes.  Ears nose mouth and throat-he is somewhat hard of hearing does not complaining of any sore throat.  Respiratory no cough or excess short of breath --history of COPD  Cardiac no chest pain or significant edema  GI-as does not complaining of any abdominal pain nausea or vomiting-no recent diarrhea to my knowledge which is encouraging GU-is not complaining of any dysuria  t  .  Muscle skeletal status post right hip fracture with repair-she is not complaining of pain--had initially complained of some pain apparently when discovered on the floor but x-rays were negative and on exam tonight she had no pain complaints.  Neurologic-no complaints of headache or dizziness-.  Psych-does have a history of dementia --this appears to be slowly progressing with increased confusion    Physical exam;     Temperature 98.7 pulse 95 respirations 20 blood pressure 140/84 with some variability as noted above-weight appears stabilized here at 108.6 .  In general is a pleasant elderly female in no distress comfortably in her wheelchair  Her skin is warm and dry she does have venous stasis changes of her shins bilaterally and also some old bruising of her lower arms bilaterally--she does have skin tears on her shins bilaterally--none of these appear infected.  Eyes--limited exam since patient did not really follow commands--- pupils appear  equal round reactive to light sclera and conjunctiva are clear extraocular movements intact visual acuity appears intact she does have prescription lenses.  Oropharynx clear mucous membranes moist.  Chest is clear to auscultation bilaterally with somewhat reduced air entry no labored breathing.  Cardiac Ir there is no evidence of CHFs regular rate and rhythm murmur gallop or rub  Abdominal exam it is soft nontender with active bowel sounds  Extremities; significant  venous stasis but no evidence of a DVT--moves all extremities x4--no significant edema--I could not appreciate any pain of the right arm or shoulder she appeared to have appropriate range of motion-I did not note any acute deformity-did move all extremities with passive range of motion without discomfort Muscle skeletal-she is significantly K phonic--he see above  Neurologic-grossly intact her speech is clear.--I do not see any lateralizing findings cranial nerves are intact  Psych she is oriented to self somewhat confused--but pleasantly so tonight-she is following verbal commands   Labs 04/19/2014.  Sodium 142 potassium 4.1 BUN 21 creatinine 1.01.  04/16/2014.  The BBC 7.4 hemoglobin 12.1 platelets 182.  Liver function tests within normal limits except alkaline phosphatase 155-albumin of 2.6.  04/14/2014.  TSH-1.011    02/18/2014.  WBC 7.8 hemoglobin 12.6 platelets 214.  Sodium 142 potassium 4.1 BUN 20 creatinine 1.07.  02/16/2014.  GGT 26.  02/10/2014. Alkaline phosphatase 285.[id number] 1716  Albumin 2.5  12/09/2013.  WBC 7.3 hemoglobin 14.0 platelets 245.  Sodium 139 potassium 4.1 BUN 21 creatinine 0.90.   Liver function tests unremarkable except for albumin of 2.8 and alkaline phosphatase 162  TSH-1.716   10/30/2013.  WBC 6.3 hemoglobin 14.5 platelets 229.  Sodium 137 potassium 4 BUN 16 creatinine 0.94.  Liver function tests within normal limits except alkaline phosphatase of 168 albumin of 3.3  08/24/2013.  Hemoglobin A1c 5.5.  WBC 6.7 hemoglobin 11.7 platelets 213.  Sodium 139 potassium 3.6 BUN 15 creatinine 0.87.  Liver function tests within normal limits except alkaline phosphatase 131 albumin of 2.5  07/07/2013.  Sodium 135 potassium 4.6 BUN 17 creatinine 1.18  06/10/2013.  WBC 10.4 hemoglobin 12.3 platelets 263.  Sodium 138 potassium 4 BUN 13 creatinine 0.96  03/30/2013.  WBC 6.9 hemoglobin 11.5 platelets 212.  Sodium  138 potassium 3.8 BUN 17 creatinine 1.01.  .  02/16/2013.  Fasting glucose 77.  02/05/2013.  WBC 7.0 hemoglobin 10.6 platelets 207.  Sodium 139 potassium 4 BUN 28 creatinine 1.13.  02/12/2013.  BNP-450.9.  01/14/2013.  Liver function tests within normal limits albumin of 2.3  Impression/plan  #1-history of recurrent C. Difficile-this now has been stable for some time which is encouraging-  .  #2  Right hip fracture; she appears to be stable.-pain is well controlled--he does have orders for Ultram as well as Tylenol when necessary.  #3..  Chronic renal insufficiency this appears to be stable  -Will update lab   Number 4  Severe hypoalbuminemia --after initial improvement this appears to have gone down-mostrecently 2.6 in early Novemberill update this-- is on supplementation   #5  Anemia hemoglobin appears stable--will update CBC  l  Number 6  Suspect moderate to severe dementia--this appears to be progressing she is seen by psychiatric services is now on Aricept as well as Namenda-Depakote  and Seroquel-   -  Number 7.  History COPD-she appears to be stable in this regards continues on Pulmicort as well as albuterol nebulizers  #8  Hypertension-  At this point will monitor again cardiology has discontinued her Norvasc secondary to occasional hypotension concerns- I suspect some elevations may be due to agitation--will order blood pressures twice a day with a log for review   #9  History of vaginal bleeding-I previously reviewed records from [place] pelvic ultrasound was done which apparently did not show any acute issues it did show symmetrical endometrium did show multiple calcifications and small ovaries-per notes from GYN on October 7--2014 --only followup when necessary--at this point there has been no further episodes of this to my knowledge--in [place] to monitor CBC   #11  Some history of occult positive stool-this may have  secondary to her C. difficile infection in the past-her hemoglobin remained stable she is on a proton pump inhibitor--will Update--  #12-history of abdominal aortic aneurysm-she did have an ultrasound during an ER visit back in January that showed relative stability - --however one done in September showed that shoed it increased somewhat to 6.7 cm-vascular surgery has evaluated this and has noted above she is not a good surgical candidate and family is in agreement with watching this--a follow-up ultrasound and appointment has been scheduled for January  #13-recent?? syncopal episode--I. have reviewed the cardiology not again conservative followup has been recommended as long as she is stable-her Norvasc has been discontinued secondary to possible concerns of transitory hypotension at times-at this point will monitor.  #14-weight loss- She continues on supplements she did have a barium study ordered as noted above which showed a large hiatal hernia some esophageal spasms recommendation by GI was small bites avoid using her straw and increase Prilosec to twice a day-at this point will monitor a challenging situation I suspect her progressing dementia is contributing to this--however her weight has been stable the  past month   #15-status post fall-clinically she appears to be stable x-ray of her right shoulder was negative for any acute process-per exam this evening she appears to be at her baseline with no acute deformities--or pain-at this point monitor with facility protocol--very  Nursing has put into effect monitoring parameters including not being left alone in the day room and keeping her with in sight of nursing  #16-have noted an increased alkaline phosphatase-GGT was normal protein electrophoresis was done and reviewed by Dr. Dellia Nims at this point we'll monitor since patient has numerous comorbidities and does not appear to be symptomatic-she would be a poor candidate for any aggressive  treatment-and this was discussed with Dr. Dellia Nims via phone previously--I also discussed this with her responsible party via phone previouslyand she is in agreement with this.--This appears to be trending down somewhat  Will update her liver function tests     #17-some history of hypokalemia-she is on supplementation will have to update metabolic panel  #75-[id number] of B12 and vitamin D deficiencies-will update levels she is on supplementation for both     [id number]-of note greater than 45 minutes spent assessing patient-reviewing her medical history-and coordinating in formulating a plan of care for numerous diagnoses-of note greater than 50% of time spent coordinating plan of care--including discussion via phone with her responsible party concerning numerous issues including status post fall evaluation-and follow-up of abdominal aortic aneurysm

## 2014-06-07 ENCOUNTER — Other Ambulatory Visit (HOSPITAL_COMMUNITY)
Admission: RE | Admit: 2014-06-07 | Discharge: 2014-06-07 | Disposition: A | Discharge status: Home / Self Care | Payer: Medicare Other | Source: Ambulatory Visit | Attending: Internal Medicine | Admitting: Internal Medicine

## 2014-06-23 ENCOUNTER — Ambulatory Visit: Payer: Medicare Other | Admitting: Vascular Surgery

## 2014-06-23 ENCOUNTER — Other Ambulatory Visit (HOSPITAL_COMMUNITY): Payer: Medicare Other

## 2014-06-25 ENCOUNTER — Non-Acute Institutional Stay (SKILLED_NURSING_FACILITY): Payer: Medicare Other | Admitting: Internal Medicine

## 2014-06-25 ENCOUNTER — Encounter: Payer: Self-pay | Admitting: Internal Medicine

## 2014-06-25 DIAGNOSIS — F03918 Unspecified dementia, unspecified severity, with other behavioral disturbance: Secondary | ICD-10-CM | POA: Insufficient documentation

## 2014-06-25 DIAGNOSIS — E8809 Other disorders of plasma-protein metabolism, not elsewhere classified: Secondary | ICD-10-CM

## 2014-06-25 DIAGNOSIS — F0391 Unspecified dementia with behavioral disturbance: Secondary | ICD-10-CM

## 2014-06-25 DIAGNOSIS — R4182 Altered mental status, unspecified: Secondary | ICD-10-CM

## 2014-06-25 NOTE — Progress Notes (Signed)
Patient ID: [name], female   DOB: 1927-01-10, 81 y.o.   MRN: [id number]  :      Facility; [facility]  This is an-acute visit-  .  Chief complaint;--acute visit secondary to dementia with behaviors     History of present illness  --; this is an 81 year old woman who came to [place] after suffering a right hip fracture she underwent a right hip hemiarthroplasty. She has a history of COPD. Other issues include dementia, tricuspid regurgitation, and chronic renal failure stage II .  Also has a history of recurrent C. difficile although this has been stable for some time When I saw her last month she had a recent fall but no acute injuries.  However her dementia appears to be progressing fairly significantly-last couple weeks have been showing increased agitation trying to get out of her [place] chair yelling-thinking she can walk.  She has been seen recently by psychiatric services I did speak with the nurse practitioner as well as reviewed her note-she has recently started her on Lamictal and is continuing her on Depakote with hopes of reducing this.  She is also now on Seroquel 25 mg twice a day in addition to Aricept as well as Namenda.  Vital signs continued to be stable.  I also spoke extensively with her daughter via phone-her daughter has been concerned at times with any medications causing oversedation-this was also discussed with the psychiatric nurse practitioner when her daughter spoke with the nurse practitioner.  However per discussion today  with her daughter is in favor of trying to get something additional to help with the agitation-.  Recent labs did not appear to be unremarkable-however she does have some history of increased agitation with urinary tract infections and we will need to check this as well.      .   -.  Family medical social history reviewed per history and physical on 01/12/2013  .  Medications have been reviewed per MAR.  Marland Kitchen  Review of  systems; somewhat limited secondary to dementia --largely obtained from nursing.  General denies any fever or chills--j Eyes-she has prescription lenses does not complaining of visual changes.  Ears nose mouth and throat-he is somewhat hard of hearing does not complaining of any sore throat.  Respiratory no cough or excess short of breath --history of COPD  Cardiac no chest pain or significant edema  GI-as does not complaining of any abdominal pain nausea or vomiting-no recent diarrhea to my knowledge which is encouraging GU-is not complaining of any dysuria although again she is quite a poor historian  t  .  Muscle skeletal status post right hip fracture with repair-she is not complaining of pain  Neurologic-no complaints of headache or dizziness-.  Psych-does have a history of dementia --this appears to be  progressing with increased confusion--agitation    Physical exam;   She is afebrile pulse is 64 respirations of 18 blood pressure 110/80-134/74- .  In general is an agitated anxious t elderly female trying to get out of her [place] chair Her skin is warm and dry she does have venous stasis changes of her shins bilaterally and also some old bruising of her lower arms bilaterally--she does have skin tears on her shins bilaterally--none of these appear infected.  Eyes--limited exam since patient did not really follow commands--- pupils appear  equal round reactive to light sclera and conjunctiva are clear extraocular movements intact visual acuity appears intact she does have prescription lenses.  Oropharynx clear  mucous membranes moist.  Chest is clear to auscultation bilaterally with somewhat reduced air entry no labored breathing.  Cardiac Ir there is no evidence of CHFs regular rate and rhythm murmur gallop or rub  Abdominal exam it is soft nontender with active bowel sounds  Extremities; significant venous stasis but no evidence of a DVT--moves all extremities x4--no  significant edema-  Muscle skeletal-she is significantly [place] photic  Neurologic-grossly intact her speech is clear.--I do not see any lateralizing findings cranial nerves are intact --she is quite agitated so a thorough neurologic exam is essentially impossible right now Psych she is oriented to self --anxious and agitated which appears to have progressed from when I saw her approximately 3 weeks ago-she is trying to get out of her [place] chair talking about getting $300-apparently this is fairly constant   Labs  06/08/2014.  Vitamin B12 1383-vitamin D 48.  WBC 6.9 hemoglobin 13.0 platelets 179.  Sodium 142 potassium 4.7 BUN 27 creatinine 1.1.  Alkaline phosphatase 142-albumin 3-otherwise liver function tests within normal limits  04/19/2014.  Sodium 142 potassium 4.1 BUN 21 creatinine 1.01.  04/16/2014.  The BBC 7.4 hemoglobin 12.1 platelets 182.  Liver function tests within normal limits except alkaline phosphatase 155-albumin of 2.6.  04/14/2014.  TSH-1.011    02/18/2014.  WBC 7.8 hemoglobin 12.6 platelets 214.  Sodium 142 potassium 4.1 BUN 20 creatinine 1.07.  02/16/2014.  GGT 26.  02/10/2014. Alkaline phosphatase 285.[id number]  Albumin 2.5  12/09/2013.  WBC 7.3 hemoglobin 14.0 platelets 245.  Sodium 139 potassium 4.1 BUN 21 creatinine 0.90.   Liver function tests unremarkable except for albumin of 2.8 and alkaline phosphatase 162  TSH-1.716   10/30/2013.  WBC 6.3 hemoglobin 14.5 platelets 229.  Sodium 137 potassium 4 BUN 16 creatinine 0.94.  Liver function tests within normal limits except alkaline phosphatase of 168 albumin of 3.3  08/24/2013.  Hemoglobin A1c 5.5.  WBC 6.7 hemoglobin 11.7 platelets 213.  Sodium 139 potassium 3.6 BUN 15 creatinine 0.87.  Liver function tests within normal limits except alkaline phosphatase 131 albumin of 2.5  07/07/2013.  Sodium 135 potassium 4.6 BUN 17 creatinine 1.18  06/10/2013.   WBC 10.4 hemoglobin 12.3 platelets 263.  Sodium 138 potassium 4 BUN 13 creatinine 0.96  03/30/2013.  WBC 6.9 hemoglobin 11.5 platelets 212.  Sodium 138 potassium 3.8 BUN 17 creatinine 1.01.  .  02/16/2013.  Fasting glucose 77.  02/05/2013.  WBC 7.0 hemoglobin 10.6 platelets 207.  Sodium 139 potassium 4 BUN 28 creatinine 1.13.  02/12/2013.  BNP-450.9.  01/14/2013.  Liver function tests within normal limits albumin of 2.3  Impression/plan  #1-dementia with agitation-this appears to be progressing- quite significa I did have a discussion with the nurse practitioner via phone-she has recommended increasing the Lamictal which was recently increased we will increase to 75 mg a day it was just increased to 50 mg approximately 3 days ago.  Continued Depakote for now as well as Seroquel twice a day Aricept and Namenda.  In regards to anxiety will start the Ativan per discussion with nurse practitioner as well as with her daughter 0.5 mg every 8 hours when necessary Also will obtain a UA CandS--  I did speak with her daughter about the. progression of dementia-and said this is not an unusual presentation but most likely reflects progressing dementia-her daughter expressed understanding of this and is comfortable with starting the Ativan and increasing her Lamictal-continue to monitor for any changes--- she realizes some of these medications may be sedating  but she believes that something has to be done here for patient's safety and comfort--and is comfortable with the changes proposed  .  #2  Right hip fracture; she appears to be stable.-pain is well controlled--he does have orders for Ultram as well as Tylenol when necessary.  #3..  Chronic renal insufficiency this appears to be stable -per recent lab with a creatinine of 1.1 BUN of 27   Number 4  Severe hypoalbuminemia --this actually appears to be rising with an albumin of 3.0 which is improved from 2.62 months  ago.     #5  Anemia hemoglobin appears stable-at 13.0 l      -  Number 6.  History COPD-she appears to be stable in this regards continues on Pulmicort as well as albuterol nebulizers  #7 Hypertension-   This remains stable despite her agitation recent blood pressures 110/80-134/74-130/72-          [id number]-of note greater than 45 minutes spent assessing patient-discussing her status with nursing staff as well as fairly extensive discussion with her daughter via phone-and discussion with the psychiatric nurse practitioner as well--of note greater than 50% of time spent coordinating plan of care with input of psychiatric nurse practitioner and her daughter

## 2014-07-14 ENCOUNTER — Non-Acute Institutional Stay (SKILLED_NURSING_FACILITY): Payer: Medicare Other | Admitting: Internal Medicine

## 2014-07-14 ENCOUNTER — Encounter (HOSPITAL_COMMUNITY)
Admission: RE | Admit: 2014-07-14 | Discharge: 2014-07-14 | Disposition: A | Discharge status: Home / Self Care | Payer: Medicare Other | Source: Skilled Nursing Facility | Attending: Internal Medicine | Admitting: Internal Medicine

## 2014-07-14 DIAGNOSIS — F0391 Unspecified dementia with behavioral disturbance: Secondary | ICD-10-CM | POA: Diagnosis not present

## 2014-07-14 DIAGNOSIS — F39 Unspecified mood [affective] disorder: Secondary | ICD-10-CM | POA: Insufficient documentation

## 2014-07-14 DIAGNOSIS — F03918 Unspecified dementia, unspecified severity, with other behavioral disturbance: Secondary | ICD-10-CM

## 2014-07-14 NOTE — Progress Notes (Signed)
Patient ID: [name], female   DOB: [birth year], 81 y.o.   MRN: [id number]   This is an acute visit.  Level of care skilled.  Facility [facility].  History of present illness.  Patient is an 81 year old female with a history of significant dementia which appears to be progressing-she is followed by psychiatric services and they have made recent medication adjustments including just increasing her [place] is also on Ativan 0.5 mg every 8 hours when necessary as well as Seroquel her Depakote was discontinued as of yesterday and again her Lamictal was increased.  She also continues on Aricept and Namenda which she has been on for some time  Patient had significant agitation this evening was attempting to kick and hit staff-vital signs could not really be obtained.  Apparently she had been doing somewhat better at times over the past couple weeks per review of psychiatric services note but apparently this has increased again the last few days.  I did speak with her daughter last month and she was comfortable with starting the Ativan-as well as the Lamictal.  Family medical social history reviewed most recently progress note on Jan- 15th 2016.--As well as psychiatric services notes  Medications have been reviewed per [facility].  Review of systems essentially unobtainable secondary agitation dementia.  Physical exam.  Current vital signs difficult to obtain however recent vital signs appear to be stable she is afebrile pulse of 74 respirations 16 blood pressure 118/62.  In general this is a very limited physical exam secondary to agitation-- patient appears to be lying in bed comfortably however any attempt at exam ismett with significant resistance and attempting to hit  She is able to move all extremities 4 to appears with significant strength I do not see any changes here.  Her breathing is unlabored-essentially at baseline physical exam from what I can tell although she does have  significant agitation.  Labs.  Jul 09 2013.  WBC 6.9 hemoglobin 13.0 platelets 179.  Sodium 142 potassium 4.7 BUN 27 creatinine 1.1.  Liver function tests within normal limits except token phosphatase 142 total protein 3.0.  Assessment and plan.  #1-dementia with behaviors-after some initial improvement this appears to be increasing again-psychiatric services is aware of this and nurse practitioner as of yesterday discontinue the Depakote and increased her Lamictal 200 mg daily she is also on Seroquel-.  She also has a when necessary Ativan order will change this to IM if patient refuses by mouth medications secondary to agitation-  Patient also does have some history of UTIs and will write an order for UA CNSwhen patient will allow-also will update a CBC CMP and ammonia level as well as TSH-again when nursing staff can obtain  [id number].--Of note greater than 25 minutes spent assessing patient-reviewing recent notes including psychiatric notes-discussing her status with nursing staff-and coordinating and formulating a plan of care-of note greater than 50% time spent coordinating plan of care

## 2014-07-15 LAB — HEMOGLOBIN A1C
Hgb A1c MFr Bld: 5.2 % (ref 4.8–5.6)
Mean Plasma Glucose: 103 mg/dL

## 2014-07-16 ENCOUNTER — Other Ambulatory Visit: Payer: Self-pay | Admitting: *Deleted

## 2014-07-16 MED ORDER — LORAZEPAM 2 MG/ML IJ SOLN: INTRAMUSCULAR | Status: DC

## 2014-07-16 MED ORDER — LORAZEPAM 0.5 MG PO TABS: ORAL_TABLET | ORAL | Status: DC

## 2014-07-16 NOTE — Telephone Encounter (Signed)
[facility] 

## 2014-07-19 ENCOUNTER — Encounter: Payer: Self-pay | Admitting: Vascular Surgery

## 2014-07-21 ENCOUNTER — Other Ambulatory Visit (HOSPITAL_COMMUNITY): Payer: Self-pay

## 2014-07-21 ENCOUNTER — Ambulatory Visit: Payer: Self-pay | Admitting: Vascular Surgery

## 2014-07-21 DIAGNOSIS — F0391 Unspecified dementia with behavioral disturbance: Secondary | ICD-10-CM

## 2014-07-21 DIAGNOSIS — F28 Other psychotic disorder not due to a substance or known physiological condition: Secondary | ICD-10-CM

## 2014-07-22 ENCOUNTER — Non-Acute Institutional Stay (SKILLED_NURSING_FACILITY): Payer: Medicare Other | Admitting: Internal Medicine

## 2014-07-22 ENCOUNTER — Ambulatory Visit (HOSPITAL_COMMUNITY): Payer: Medicare Other | Attending: Internal Medicine

## 2014-07-22 DIAGNOSIS — R05 Cough: Secondary | ICD-10-CM | POA: Insufficient documentation

## 2014-07-22 DIAGNOSIS — F28 Other psychotic disorder not due to a substance or known physiological condition: Secondary | ICD-10-CM

## 2014-07-22 DIAGNOSIS — F0391 Unspecified dementia with behavioral disturbance: Secondary | ICD-10-CM

## 2014-07-22 DIAGNOSIS — F03918 Unspecified dementia, unspecified severity, with other behavioral disturbance: Secondary | ICD-10-CM

## 2014-07-23 ENCOUNTER — Other Ambulatory Visit: Payer: Self-pay | Admitting: *Deleted

## 2014-07-23 MED ORDER — LORAZEPAM 0.5 MG PO TABS: ORAL_TABLET | ORAL | Status: DC

## 2014-07-23 NOTE — Telephone Encounter (Signed)
[facility] 

## 2014-07-24 NOTE — Progress Notes (Addendum)
Patient ID: [name], female   DOB: 1926-09-30, 81 y.o.   MRN: [id number]                PROGRESS NOTE  DATE:  07/21/2014                FACILITY: [facility]         LEVEL OF CARE:   SNF   Acute Visit                            CHIEF COMPLAINT:  Behavioral disturbances associated with dementia.    HISTORY OF PRESENT ILLNESS:  This is an 81 year-old woman with advanced dementia.  She is being followed by our service as well as Psychiatry for really distressing behavior.  This includes frank violence, scratching, hitting, kicking.  The behaviors are present at all times during the day.  According to the staff, this really started roughly a week ago.    She initially came into the building after a hip fracture.  She has been here for roughly a year, I believe, perhaps longer than that.    Looking through her recent medication adjustments, she was treated with Lamictal.  She was initially started on Seroquel on 05/26/2014.  The dose was 25 q.h.s. and then increased on 05/28/2014 to 25 q.a.m. and q.h.s.  I believe she had been on Depakote before that although that was stopped, I think, when Lamictal was started on 06/15/2014.  She has gradually had a tapering increased dose of Lamictal, currently up to 100 mg on 07/13/2014.  She also is on p.o. Ativan.    There is apparently a recent urine culture that showed 30,000 colonies of multiple bacterial morphotypes, not indicative of infection.    She has not been running a fever.        LABORATORY DATA:  Lab work from earlier this month showed a normal CBC.    A comprehensive metabolic panel shows an albumin of 2.7, a BUN of 29, and a creatinine of 1.15.       PHYSICAL EXAMINATION:   GENERAL APPEARANCE:  The patient is seen in a Geri-chair.  She is awake.   CHEST/RESPIRATORY:  Clear air entry bilaterally.    CARDIOVASCULAR:   CARDIAC:  Perhaps some degree of volume contraction.  Otherwise, normal findings.   GASTROINTESTINAL:     ABDOMEN:  There may be some signs of weight loss.  No masses.   LIVER/SPLEEN/KIDNEYS:  No liver, no spleen.  No tenderness.   GENITOURINARY:   BLADDER:  No suprapubic fullness or costovertebral angle tenderness.   PSYCHIATRIC:   MENTAL STATUS:   She is awake.  Will respond to questions.  Talks about people trying to kill her.  Apparently, she has threatened to kill other people including other residents.    ASSESSMENT/PLAN:                     Dementia with psychosis.  Unfortunately, the psychosis is really very problematic here.  There is not an overt cause of coexistent delirium.  It is not impossible that p.r.n. Ativan can disinhibit patients like this.  I think this should stop.  I am going to increase the Seroquel to 50 mg twice a day, p.r.n. Haldol for severe agitation only.  I do not really have much familiarity with Lamictal as a mood stabilizer, but I will leave this for now. Consider a  more potent antipsychotic if this continues. Fortunately she is a small, frail patient.    At this point the severity of the psychosis outweighs any concerns about antipsychotic use in this population.

## 2014-07-26 ENCOUNTER — Non-Acute Institutional Stay (SKILLED_NURSING_FACILITY): Payer: Medicare Other | Admitting: Internal Medicine

## 2014-07-26 DIAGNOSIS — F03918 Unspecified dementia, unspecified severity, with other behavioral disturbance: Secondary | ICD-10-CM

## 2014-07-26 DIAGNOSIS — F28 Other psychotic disorder not due to a substance or known physiological condition: Secondary | ICD-10-CM

## 2014-07-26 DIAGNOSIS — F0391 Unspecified dementia with behavioral disturbance: Secondary | ICD-10-CM

## 2014-07-27 NOTE — Progress Notes (Addendum)
Patient ID: [name], female   DOB: Jun 04, 1927, 81 y.o.   MRN: [id number]                PROGRESS NOTE  DATE:   07/26/2014                FACILITY: [facility]       LEVEL OF CARE:   SNF   Acute Visit            CHIEF COMPLAINT:  Follow up dementia with severe behavioral disturbances.    HISTORY OF PRESENT ILLNESS:  I saw [name] last week.  She had also been followed by Psychiatry for severe psychosis associated with her dementia.  This had the association of people threatening to kill her or her threatening to kill other people, which she was able to verbalize.   She was frankly violent, scratching, hitting, and kicking staff and other residents.    She had the proverbial urine culture done that showed many bacterial morphocytes.    A comprehensive metabolic panel showed an albumin of 2.7, BUN 29, and a creatinine of 1.15.    I increased her Seroquel to 50 q.a.m. and q.h.s. from 25 q.a.m. and q.h.s.  P.r.n. Ativan was discontinued as a possible culprit in the agitation.  I note an attempt to change her to the more ptent antipsychotic risperdal was started and then stopped but her seroquel was escalated to 75 bid  Last week, I was notified that the facility wanted to attempt to send her to [facility].  I cautioned them to delay this until I could see her this week.    CURRENT MEDICATIONS:  Medication list is reviewed.                        Acetaminophen 500 three times a day.      Aricept 10 q.d.     Vitamin B12, 1000 monthly.    Folic acid 1 mg daily.    Haldol 1 mg q.8 hours p.r.n. severe agitation.     Lamictal 100 mg once a day.    Namenda 10 mg b.i.d.        I now see that the Seroquel has been increased to 75 b.i.d. by Psychiatry over the phone  PHYSICAL EXAMINATION:   GENERAL APPEARANCE:  The patient is much calmer and certainly less distressed.   CHEST/RESPIRATORY:  Clear air entry bilaterally.    CARDIOVASCULAR:     GASTROINTESTINAL:   ABDOMEN:  Soft.     LIVER/SPLEEN/KIDNEYS:  No liver, no spleen.  No tenderness.     PSYCHIATRIC:   MENTAL STATUS:  She is much more cooperative with me.  Allowed me to examine her with no issues.  There was no verbalized psychosis.    ASSESSMENT/PLAN:                                Dementia with severe psychosis, as outlined above.  For some reason, she was increased to Seroquel 75 b.i.d. by Psychiatry.  Although currently she seems to be better, this certainly would not have given the 50 b.i.d. any time to be effective.   Nevertheless, the Seroquel should be maintained for several weeks, assuming that the patient tolerates this which is a major assumption.  The nursing staff assures me that she is eating and drinking normally and is not excessively sedated.

## 2014-08-02 ENCOUNTER — Encounter (HOSPITAL_COMMUNITY)
Admission: RE | Admit: 2014-08-02 | Discharge: 2014-08-02 | Disposition: A | Discharge status: Home / Self Care | Payer: Medicare Other | Source: Skilled Nursing Facility | Attending: Internal Medicine | Admitting: Internal Medicine

## 2014-08-02 DIAGNOSIS — F39 Unspecified mood [affective] disorder: Secondary | ICD-10-CM | POA: Diagnosis not present

## 2014-08-02 LAB — BASIC METABOLIC PANEL
Anion gap: 3 — ABNORMAL LOW (ref 5–15)
BUN: 32 mg/dL — ABNORMAL HIGH (ref 6–23)
CO2: 27 mmol/L (ref 19–32)
CREATININE: 1.26 mg/dL — AB (ref 0.50–1.10)
Calcium: 8.8 mg/dL (ref 8.4–10.5)
Chloride: 108 mmol/L (ref 96–112)
GFR calc non Af Amer: 38 mL/min — ABNORMAL LOW (ref >90)
GFR, EST AFRICAN AMERICAN: 44 mL/min — AB (ref >90)
Glucose, Bld: 74 mg/dL (ref 70–99)
POTASSIUM: 4.1 mmol/L (ref 3.5–5.1)
SODIUM: 138 mmol/L (ref 135–145)

## 2014-08-18 ENCOUNTER — Non-Acute Institutional Stay (SKILLED_NURSING_FACILITY): Payer: Medicare Other | Admitting: Internal Medicine

## 2014-08-18 DIAGNOSIS — J438 Other emphysema: Secondary | ICD-10-CM

## 2014-08-18 DIAGNOSIS — S72001D Fracture of unspecified part of neck of right femur, subsequent encounter for closed fracture with routine healing: Secondary | ICD-10-CM

## 2014-08-18 DIAGNOSIS — F03918 Unspecified dementia, unspecified severity, with other behavioral disturbance: Secondary | ICD-10-CM

## 2014-08-18 DIAGNOSIS — F0391 Unspecified dementia with behavioral disturbance: Secondary | ICD-10-CM | POA: Diagnosis not present

## 2014-08-18 DIAGNOSIS — I10 Essential (primary) hypertension: Secondary | ICD-10-CM | POA: Diagnosis not present

## 2014-08-18 NOTE — Progress Notes (Signed)
Patient ID: [name], female   DOB: 1926/11/27, 81 y.o.   MRN: [id number]   Facility; [facility]  This is a 81 routine visit Level of care skilled  .  Chief complaint;--acute visit follow-up medical issues including dementia with behaviors-hypertension-COPD-anemia-history of right hip fracture-renal insufficiency     History of present illness  --; this is an 81 year old woman who came to [place] after suffering a right hip fracture she underwent a right hip hemiarthroplasty. She has a history of COPD. Other issues include dementia, tricuspid regurgitation, and chronic renal failure stage II .  Also has a history of recurrent C. difficile although this has been stable for some time  However her dementia appears to be progressing fairly significantly-l this has been her major issue over the past several weeks.  However this appears to have stabilized recently.  There've been numerous medication changes she has been seen by Dr. Dellia Nims as well as psychiatric services.  Currently she is on Lamictal 100 mg a day-Seroquell 75 mg twice a day-she has held all as needed when necessary 1 mg every 8 hours.  This appears to have stabilized her she is more cooperative with nursing staff has not been hitting or threatening staff which was a significant issue several weeks ago.    -.  Family medical social history reviewed per history and physical on 01/12/2013 --and previous progress notes .  Medications have been reviewed per MAR.  Marland Kitchen  Review of systems; somewhat limited secondary to dementia --largely obtained from nursing.  General denies any fever or chills--j Eyes-she has prescription lenses does not complaining of visual changes.  Ears nose mouth and throat-he is somewhat hard of hearing does not complaining of any sore throat.  Respiratory no cough or excess short of breath --history of COPD  Cardiac no chest pain or significant edema  GI-as does not complaining of any  abdominal pain nausea or vomiting-no recent diarrhea to my knowledge which is encouraging GU-is not complaining of any dysuria although again she is quite a poor historian  t  .  Muscle skeletal status post right hip fracture with repair-she is not complaining of pain  Neurologic-no complaints of headache or dizziness-.  Psych-does have a history of dementia --this appears to be  progressing behaviors have significantly improved recently    Physical exam;  98.2 pulse 68 respirations 18 blood pressure variable 117/53-151/96 in this range I do not see consistent elevations weight is 106 - . General this is a pleasant elderly female resting comfortably in her [place] chair she is cooperative with exam although confused I do not note agitation--she is alert Her skin is warm and dry she does have venous stasis changes of her shins bilaterally and also some old bruising of her lower arms bilaterally--.  Eyes--limited exam since patient did not really follow commands--- pupils appear  equal round reactive to light sclera and conjunctiva are clear extraocular movements intact visual acuity appears intact she does have prescription lenses.  Oropharynx clear mucous membranes moist.  Chest is clear to auscultation bilaterally with somewhat reduced air entry no labored breathing.  Cardiac Ir there is no evidence of CHFs regular rate and rhythm murmur gallop or rub  Abdominal exam it is soft nontender with active bowel sounds  Extremities; significant venous stasis but no evidence of a DVT--moves all extremities x4--no significant edema-  Muscle skeletal-she is significantly [place] photic--does move all her extremities with general frailty  Neurologic-grossly intact her speech is clear.--I do  not see any lateralizing findings cranial nerves are intact --her speech is clear Psych she is oriented to self -she is pleasant and cooperative with exam oriented to self only this is her  baseline-significantly [place] than when I saw her during last routine visit    Labs  08/02/2014.  Sodium 138 potassium 4.1 BUN 32 creatinine 1.26 .  07/15/2014.  WBC 7.4 hemoglobin 12.0 platelets 165.  Sodium 137 potassium 4.3 BUN 29 creatinine 1.15.  Alkaline phosphatase 138 albumin 2.7 otherwise liver function tests within normal limits     06/08/2014.  Vitamin B12 1383-vitamin D 48.  WBC 6.9 hemoglobin 13.0 platelets 179.  Sodium 142 potassium 4.7 BUN 27 creatinine 1.1.  Alkaline phosphatase 142-albumin 3-otherwise liver function tests within normal limits  04/19/2014.  Sodium 142 potassium 4.1 BUN 21 creatinine 1.01.  04/16/2014.  The BBC 7.4 hemoglobin 12.1 platelets 182.  Liver function tests within normal limits except alkaline phosphatase 155-albumin of 2.6.  04/14/2014.  TSH-1.011    02/18/2014.  WBC 7.8 hemoglobin 12.6 platelets 214.  Sodium 142 potassium 4.1 BUN 20 creatinine 1.07.  02/16/2014.  GGT 26.  02/10/2014. Alkaline phosphatase 285.DVV6.160V37 1716  Albumin 2.5  12/09/2013.  WBC 7.3 hemoglobin 14.0 platelets 245.  Sodium 139 potassium 4.1 BUN 21 creatinine 0.90.   Liver function tests unremarkable except for albumin of 2.8 and alkaline phosphatase 162  TSH-1.716   10/30/2013.  WBC 6.3 hemoglobin 14.5 platelets 229.  Sodium 137 potassium 4 BUN 16 creatinine 0.94.  Liver function tests within normal limits except alkaline phosphatase of 168 albumin of 3.3  08/24/2013.  Hemoglobin A1c 5.5.  WBC 6.7 hemoglobin 11.7 platelets 213.  Sodium 139 potassium 3.6 BUN 15 creatinine 0.87.  Liver function tests within normal limits except alkaline phosphatase 131 albumin of 2.5  07/07/2013.  Sodium 135 potassium 4.6 BUN 17 creatinine 1.18  06/10/2013.  WBC 10.4 hemoglobin 12.3 platelets 263.  Sodium 138 potassium 4 BUN 13 creatinine 0.96  03/30/2013.  WBC 6.9 hemoglobin 11.5 platelets 212.  Sodium 138  potassium 3.8 BUN 17 creatinine 1.01.  .  02/16/2013.  Fasting glucose 77.  02/05/2013.  WBC 7.0 hemoglobin 10.6 platelets 207.  Sodium 139 potassium 4 BUN 28 creatinine 1.13.  02/12/2013.  BNP-450.9.  01/14/2013.  Liver function tests within normal limits albumin of 2.3  Impression/plan  #1-dementia with agitation-agitation appears to be significantly reduced on recent medications again she is on Lamictal 100 mg a day as well as Namenda and Aricept in addition to the Seroquel 75 mg twice a day she does have held all when necessary and apparently does not require this much--since she is on Seroquel well, pain a hemoglobin A1c   .  #2  Right hip fracture; she appears to be stable.-pain is well controlled--he does have orders for Ultram as well as Tylenol when necessary.  #3..  Chronic renal insufficiency this appears to be stable -per recent lab with a creatinine 1.26 and BUN of 32  Number 4  Severe hypoalbuminemia -- albumin was 2.7 last month we will update this--she is on supplements as well as Remeron for appetite stimulation-. Her weight appears to be stable compared to last month--staff does feed her     #5  Anemia hemoglobin appears stable-at 12.0 l      -  Number 6.  History COPD-she appears to be stable in this regards continues on Pulmicort as well as albuterol nebulizers  #7 Hypertension-   This remains relatively stable with some variability as  noted above  [id number]

## 2014-08-19 ENCOUNTER — Other Ambulatory Visit (HOSPITAL_COMMUNITY)
Admission: AD | Admit: 2014-08-19 | Discharge: 2014-08-19 | Disposition: A | Discharge status: Home / Self Care | Payer: Medicare Other | Source: Skilled Nursing Facility | Attending: Internal Medicine | Admitting: Internal Medicine

## 2014-08-19 LAB — COMPREHENSIVE METABOLIC PANEL
ALT: 11 U/L (ref 0–35)
ANION GAP: 6 (ref 5–15)
AST: 19 U/L (ref 0–37)
Albumin: 2.6 g/dL — ABNORMAL LOW (ref 3.5–5.2)
Alkaline Phosphatase: 136 U/L — ABNORMAL HIGH (ref 39–117)
BUN: 29 mg/dL — ABNORMAL HIGH (ref 6–23)
CO2: 27 mmol/L (ref 19–32)
Calcium: 8.9 mg/dL (ref 8.4–10.5)
Chloride: 108 mmol/L (ref 96–112)
Creatinine, Ser: 1.21 mg/dL — ABNORMAL HIGH (ref 0.50–1.10)
GFR calc non Af Amer: 40 mL/min — ABNORMAL LOW (ref >90)
GFR, EST AFRICAN AMERICAN: 46 mL/min — AB (ref >90)
GLUCOSE: 69 mg/dL — AB (ref 70–99)
Potassium: 4 mmol/L (ref 3.5–5.1)
SODIUM: 141 mmol/L (ref 135–145)
Total Bilirubin: 0.9 mg/dL (ref 0.3–1.2)
Total Protein: 6.1 g/dL (ref 6.0–8.3)

## 2014-08-19 LAB — CBC WITH DIFFERENTIAL/PLATELET
Basophils Absolute: 0 10*3/uL (ref 0.0–0.1)
Basophils Relative: 1 % (ref 0–1)
EOS ABS: 0.1 10*3/uL (ref 0.0–0.7)
EOS PCT: 1 % (ref 0–5)
HEMATOCRIT: 35.5 % — AB (ref 36.0–46.0)
HEMOGLOBIN: 11.4 g/dL — AB (ref 12.0–15.0)
LYMPHS ABS: 1.5 10*3/uL (ref 0.7–4.0)
Lymphocytes Relative: 20 % (ref 12–46)
MCH: 29.9 pg (ref 26.0–34.0)
MCHC: 32.1 g/dL (ref 30.0–36.0)
MCV: 93.2 fL (ref 78.0–100.0)
MONO ABS: 0.9 10*3/uL (ref 0.1–1.0)
MONOS PCT: 11 % (ref 3–12)
NEUTROS PCT: 67 % (ref 43–77)
Neutro Abs: 5 10*3/uL (ref 1.7–7.7)
Platelets: 147 10*3/uL — ABNORMAL LOW (ref 150–400)
RBC: 3.81 MIL/uL — AB (ref 3.87–5.11)
RDW: 15.6 % — ABNORMAL HIGH (ref 11.5–15.5)
WBC: 7.5 10*3/uL (ref 4.0–10.5)

## 2014-08-20 LAB — HEMOGLOBIN A1C
Hgb A1c MFr Bld: 5.1 % (ref 4.8–5.6)
Mean Plasma Glucose: 100 mg/dL

## 2014-08-31 ENCOUNTER — Encounter: Payer: Self-pay | Admitting: Vascular Surgery

## 2014-09-01 ENCOUNTER — Ambulatory Visit: Payer: Self-pay | Admitting: Vascular Surgery

## 2014-09-01 ENCOUNTER — Other Ambulatory Visit (HOSPITAL_COMMUNITY): Payer: Self-pay

## 2014-09-02 ENCOUNTER — Other Ambulatory Visit: Payer: Self-pay | Admitting: *Deleted

## 2014-09-02 MED ORDER — TRAMADOL HCL 50 MG PO TABS: ORAL_TABLET | ORAL | Status: DC

## 2014-09-02 NOTE — Telephone Encounter (Signed)
[facility] 

## 2014-09-10 ENCOUNTER — Encounter (HOSPITAL_COMMUNITY)
Admission: AD | Admit: 2014-09-10 | Discharge: 2014-09-10 | Disposition: A | Discharge status: Home / Self Care | Payer: Medicare Other | Source: Skilled Nursing Facility | Attending: Internal Medicine | Admitting: Internal Medicine

## 2014-09-10 DIAGNOSIS — R69 Illness, unspecified: Secondary | ICD-10-CM | POA: Diagnosis present

## 2014-09-10 LAB — COMPREHENSIVE METABOLIC PANEL
ALT: 12 U/L (ref 0–35)
AST: 23 U/L (ref 0–37)
Albumin: 2.7 g/dL — ABNORMAL LOW (ref 3.5–5.2)
Alkaline Phosphatase: 135 U/L — ABNORMAL HIGH (ref 39–117)
Anion gap: 7 (ref 5–15)
BUN: 25 mg/dL — AB (ref 6–23)
CHLORIDE: 107 mmol/L (ref 96–112)
CO2: 25 mmol/L (ref 19–32)
Calcium: 9 mg/dL (ref 8.4–10.5)
Creatinine, Ser: 1.02 mg/dL (ref 0.50–1.10)
GFR calc Af Amer: 56 mL/min — ABNORMAL LOW (ref >90)
GFR calc non Af Amer: 49 mL/min — ABNORMAL LOW (ref >90)
GLUCOSE: 72 mg/dL (ref 70–99)
POTASSIUM: 4 mmol/L (ref 3.5–5.1)
Sodium: 139 mmol/L (ref 135–145)
Total Bilirubin: 0.8 mg/dL (ref 0.3–1.2)
Total Protein: 6.3 g/dL (ref 6.0–8.3)

## 2014-09-10 LAB — CBC
HEMATOCRIT: 37.6 % (ref 36.0–46.0)
Hemoglobin: 11.9 g/dL — ABNORMAL LOW (ref 12.0–15.0)
MCH: 29.2 pg (ref 26.0–34.0)
MCHC: 31.6 g/dL (ref 30.0–36.0)
MCV: 92.4 fL (ref 78.0–100.0)
PLATELETS: 180 10*3/uL (ref 150–400)
RBC: 4.07 MIL/uL (ref 3.87–5.11)
RDW: 15.4 % (ref 11.5–15.5)
WBC: 7.9 10*3/uL (ref 4.0–10.5)

## 2014-10-02 ENCOUNTER — Encounter (HOSPITAL_COMMUNITY): Payer: Self-pay | Admitting: Emergency Medicine

## 2014-10-02 ENCOUNTER — Emergency Department (HOSPITAL_COMMUNITY): Payer: Medicare Other

## 2014-10-02 ENCOUNTER — Non-Acute Institutional Stay (SKILLED_NURSING_FACILITY): Payer: Medicare Other | Admitting: Internal Medicine

## 2014-10-02 ENCOUNTER — Inpatient Hospital Stay (HOSPITAL_COMMUNITY)
Admission: EM | Admit: 2014-10-02 | Discharge: 2014-10-04 | DRG: 690 | Disposition: A | Discharge status: Skilled Nursing Facility | Payer: Medicare Other | Attending: Internal Medicine | Admitting: Internal Medicine

## 2014-10-02 DIAGNOSIS — Z801 Family history of malignant neoplasm of trachea, bronchus and lung: Secondary | ICD-10-CM

## 2014-10-02 DIAGNOSIS — N179 Acute kidney failure, unspecified: Secondary | ICD-10-CM | POA: Diagnosis present

## 2014-10-02 DIAGNOSIS — D649 Anemia, unspecified: Secondary | ICD-10-CM | POA: Diagnosis present

## 2014-10-02 DIAGNOSIS — Z66 Do not resuscitate: Secondary | ICD-10-CM | POA: Diagnosis present

## 2014-10-02 DIAGNOSIS — I714 Abdominal aortic aneurysm, without rupture, unspecified: Secondary | ICD-10-CM | POA: Diagnosis present

## 2014-10-02 DIAGNOSIS — F0391 Unspecified dementia with behavioral disturbance: Secondary | ICD-10-CM

## 2014-10-02 DIAGNOSIS — L539 Erythematous condition, unspecified: Secondary | ICD-10-CM

## 2014-10-02 DIAGNOSIS — J449 Chronic obstructive pulmonary disease, unspecified: Secondary | ICD-10-CM | POA: Diagnosis present

## 2014-10-02 DIAGNOSIS — K625 Hemorrhage of anus and rectum: Secondary | ICD-10-CM | POA: Diagnosis present

## 2014-10-02 DIAGNOSIS — I129 Hypertensive chronic kidney disease with stage 1 through stage 4 chronic kidney disease, or unspecified chronic kidney disease: Secondary | ICD-10-CM | POA: Diagnosis present

## 2014-10-02 DIAGNOSIS — R400 Somnolence: Secondary | ICD-10-CM | POA: Diagnosis not present

## 2014-10-02 DIAGNOSIS — Z85828 Personal history of other malignant neoplasm of skin: Secondary | ICD-10-CM

## 2014-10-02 DIAGNOSIS — Z87891 Personal history of nicotine dependence: Secondary | ICD-10-CM

## 2014-10-02 DIAGNOSIS — I950 Idiopathic hypotension: Secondary | ICD-10-CM | POA: Diagnosis not present

## 2014-10-02 DIAGNOSIS — Z96641 Presence of right artificial hip joint: Secondary | ICD-10-CM | POA: Diagnosis present

## 2014-10-02 DIAGNOSIS — J9610 Chronic respiratory failure, unspecified whether with hypoxia or hypercapnia: Secondary | ICD-10-CM | POA: Diagnosis present

## 2014-10-02 DIAGNOSIS — K5641 Fecal impaction: Secondary | ICD-10-CM | POA: Diagnosis present

## 2014-10-02 DIAGNOSIS — A047 Enterocolitis due to Clostridium difficile: Secondary | ICD-10-CM | POA: Diagnosis present

## 2014-10-02 DIAGNOSIS — F410 Panic disorder [episodic paroxysmal anxiety] without agoraphobia: Secondary | ICD-10-CM | POA: Diagnosis present

## 2014-10-02 DIAGNOSIS — F0390 Unspecified dementia without behavioral disturbance: Secondary | ICD-10-CM | POA: Diagnosis present

## 2014-10-02 DIAGNOSIS — Z800 Family history of malignant neoplasm of digestive organs: Secondary | ICD-10-CM | POA: Diagnosis not present

## 2014-10-02 DIAGNOSIS — N183 Chronic kidney disease, stage 3 (moderate): Secondary | ICD-10-CM | POA: Diagnosis present

## 2014-10-02 DIAGNOSIS — Z803 Family history of malignant neoplasm of breast: Secondary | ICD-10-CM

## 2014-10-02 DIAGNOSIS — J439 Emphysema, unspecified: Secondary | ICD-10-CM | POA: Diagnosis not present

## 2014-10-02 DIAGNOSIS — I959 Hypotension, unspecified: Secondary | ICD-10-CM | POA: Diagnosis present

## 2014-10-02 DIAGNOSIS — Z8249 Family history of ischemic heart disease and other diseases of the circulatory system: Secondary | ICD-10-CM | POA: Diagnosis not present

## 2014-10-02 DIAGNOSIS — Z8052 Family history of malignant neoplasm of bladder: Secondary | ICD-10-CM

## 2014-10-02 DIAGNOSIS — R1030 Lower abdominal pain, unspecified: Secondary | ICD-10-CM | POA: Diagnosis not present

## 2014-10-02 DIAGNOSIS — I9589 Other hypotension: Secondary | ICD-10-CM

## 2014-10-02 DIAGNOSIS — N390 Urinary tract infection, site not specified: Secondary | ICD-10-CM | POA: Diagnosis present

## 2014-10-02 DIAGNOSIS — R109 Unspecified abdominal pain: Secondary | ICD-10-CM | POA: Diagnosis present

## 2014-10-02 DIAGNOSIS — R100 Acute abdomen: Secondary | ICD-10-CM | POA: Diagnosis not present

## 2014-10-02 DIAGNOSIS — F03918 Unspecified dementia, unspecified severity, with other behavioral disturbance: Secondary | ICD-10-CM

## 2014-10-02 LAB — CBC WITH DIFFERENTIAL/PLATELET
BASOS ABS: 0 10*3/uL (ref 0.0–0.1)
Basophils Relative: 0 % (ref 0–1)
Eosinophils Absolute: 0 10*3/uL (ref 0.0–0.7)
Eosinophils Relative: 0 % (ref 0–5)
HCT: 37.7 % (ref 36.0–46.0)
Hemoglobin: 12 g/dL (ref 12.0–15.0)
LYMPHS ABS: 1.4 10*3/uL (ref 0.7–4.0)
Lymphocytes Relative: 17 % (ref 12–46)
MCH: 30.1 pg (ref 26.0–34.0)
MCHC: 31.8 g/dL (ref 30.0–36.0)
MCV: 94.5 fL (ref 78.0–100.0)
Monocytes Absolute: 0.6 10*3/uL (ref 0.1–1.0)
Monocytes Relative: 8 % (ref 3–12)
NEUTROS ABS: 6.1 10*3/uL (ref 1.7–7.7)
Neutrophils Relative %: 75 % (ref 43–77)
Platelets: 159 10*3/uL (ref 150–400)
RBC: 3.99 MIL/uL (ref 3.87–5.11)
RDW: 15.5 % (ref 11.5–15.5)
WBC: 8.1 10*3/uL (ref 4.0–10.5)

## 2014-10-02 LAB — POC OCCULT BLOOD, ED: Fecal Occult Bld: POSITIVE — AB

## 2014-10-02 LAB — URINALYSIS, ROUTINE W REFLEX MICROSCOPIC
Bilirubin Urine: NEGATIVE
Glucose, UA: 100 mg/dL — AB
Ketones, ur: NEGATIVE mg/dL
Nitrite: NEGATIVE
PROTEIN: 100 mg/dL — AB
Specific Gravity, Urine: 1.02 (ref 1.005–1.030)
Urobilinogen, UA: 1 mg/dL (ref 0.0–1.0)
pH: 6.5 (ref 5.0–8.0)

## 2014-10-02 LAB — COMPREHENSIVE METABOLIC PANEL
ALT: 13 U/L (ref 0–35)
AST: 26 U/L (ref 0–37)
Albumin: 3 g/dL — ABNORMAL LOW (ref 3.5–5.2)
Alkaline Phosphatase: 134 U/L — ABNORMAL HIGH (ref 39–117)
Anion gap: 8 (ref 5–15)
BUN: 34 mg/dL — ABNORMAL HIGH (ref 6–23)
CALCIUM: 9.4 mg/dL (ref 8.4–10.5)
CO2: 27 mmol/L (ref 19–32)
Chloride: 105 mmol/L (ref 96–112)
Creatinine, Ser: 1.51 mg/dL — ABNORMAL HIGH (ref 0.50–1.10)
GFR calc Af Amer: 35 mL/min — ABNORMAL LOW (ref >90)
GFR calc non Af Amer: 30 mL/min — ABNORMAL LOW (ref >90)
Glucose, Bld: 141 mg/dL — ABNORMAL HIGH (ref 70–99)
Potassium: 4.6 mmol/L (ref 3.5–5.1)
Sodium: 140 mmol/L (ref 135–145)
Total Bilirubin: 0.8 mg/dL (ref 0.3–1.2)
Total Protein: 6.9 g/dL (ref 6.0–8.3)

## 2014-10-02 LAB — LIPASE, BLOOD: Lipase: 22 U/L (ref 11–59)

## 2014-10-02 LAB — URINE MICROSCOPIC-ADD ON

## 2014-10-02 MED ORDER — ONDANSETRON HCL 4 MG/2ML IJ SOLN
4.0000 mg | Freq: Four times a day (QID) | INTRAMUSCULAR | Status: DC | PRN
  Administered 2014-10-03: 4 mg via INTRAVENOUS
  Filled 2014-10-02: qty 2

## 2014-10-02 MED ORDER — ENSURE ENLIVE PO LIQD
237.0000 mL | Freq: Two times a day (BID) | ORAL | Status: DC
  Administered 2014-10-03 – 2014-10-04 (×4): 237 mL via ORAL

## 2014-10-02 MED ORDER — HYDROMORPHONE HCL 1 MG/ML IJ SOLN: 0.5000 mg | INTRAMUSCULAR | Status: DC | PRN

## 2014-10-02 MED ORDER — FOLIC ACID 1 MG PO TABS
1.0000 mg | ORAL_TABLET | Freq: Every day | ORAL | Status: DC
  Filled 2014-10-02: qty 1

## 2014-10-02 MED ORDER — DONEPEZIL HCL 5 MG PO TABS
10.0000 mg | ORAL_TABLET | Freq: Every day | ORAL | Status: DC
  Administered 2014-10-03: 10 mg via ORAL
  Filled 2014-10-02: qty 2

## 2014-10-02 MED ORDER — ADULT MULTIVITAMIN W/MINERALS CH
1.0000 | ORAL_TABLET | Freq: Every day | ORAL | Status: DC
  Filled 2014-10-02: qty 1

## 2014-10-02 MED ORDER — SODIUM CHLORIDE 0.9 % IV BOLUS (SEPSIS)
500.0000 mL | Freq: Once | INTRAVENOUS | Status: AC
  Administered 2014-10-02: 500 mL via INTRAVENOUS

## 2014-10-02 MED ORDER — CYANOCOBALAMIN 1000 MCG/ML IJ SOLN
1000.0000 ug | INTRAMUSCULAR | Status: DC
  Administered 2014-10-03: 1000 ug via INTRAMUSCULAR
  Filled 2014-10-02: qty 1

## 2014-10-02 MED ORDER — ACETAMINOPHEN 325 MG PO TABS
650.0000 mg | ORAL_TABLET | Freq: Four times a day (QID) | ORAL | Status: DC | PRN
  Administered 2014-10-03: 650 mg via ORAL
  Filled 2014-10-02: qty 2

## 2014-10-02 MED ORDER — BUDESONIDE 0.25 MG/2ML IN SUSP
0.2500 mg | Freq: Two times a day (BID) | RESPIRATORY_TRACT | Status: DC
  Administered 2014-10-03 (×2): 0.25 mg via RESPIRATORY_TRACT
  Filled 2014-10-02 (×8): qty 2

## 2014-10-02 MED ORDER — IPRATROPIUM-ALBUTEROL 0.5-2.5 (3) MG/3ML IN SOLN
RESPIRATORY_TRACT | Status: AC
  Filled 2014-10-02: qty 3

## 2014-10-02 MED ORDER — MEMANTINE HCL 10 MG PO TABS
10.0000 mg | ORAL_TABLET | Freq: Two times a day (BID) | ORAL | Status: DC
  Administered 2014-10-02 – 2014-10-03 (×2): 10 mg via ORAL
  Filled 2014-10-02 (×2): qty 1

## 2014-10-02 MED ORDER — SODIUM CHLORIDE 0.9 % IV SOLN
INTRAVENOUS | Status: DC
  Administered 2014-10-03 – 2014-10-04 (×3): via INTRAVENOUS

## 2014-10-02 MED ORDER — QUETIAPINE FUMARATE 25 MG PO TABS
25.0000 mg | ORAL_TABLET | Freq: Every day | ORAL | Status: DC
  Filled 2014-10-02: qty 1

## 2014-10-02 MED ORDER — DEXTROSE 5 % IV SOLN
1.0000 g | Freq: Once | INTRAVENOUS | Status: AC
  Administered 2014-10-02: 1 g via INTRAVENOUS
  Filled 2014-10-02: qty 10

## 2014-10-02 MED ORDER — ACETAMINOPHEN 650 MG RE SUPP: 650.0000 mg | Freq: Four times a day (QID) | RECTAL | Status: DC | PRN

## 2014-10-02 MED ORDER — FENTANYL CITRATE (PF) 100 MCG/2ML IJ SOLN
25.0000 ug | Freq: Once | INTRAMUSCULAR | Status: AC
  Administered 2014-10-02: 25 ug via INTRAVENOUS
  Filled 2014-10-02: qty 2

## 2014-10-02 MED ORDER — CIPROFLOXACIN IN D5W 200 MG/100ML IV SOLN
200.0000 mg | Freq: Two times a day (BID) | INTRAVENOUS | Status: DC
  Administered 2014-10-03: 200 mg via INTRAVENOUS
  Filled 2014-10-02 (×3): qty 100

## 2014-10-02 MED ORDER — ALUM & MAG HYDROXIDE-SIMETH 200-200-20 MG/5ML PO SUSP: 30.0000 mL | Freq: Four times a day (QID) | ORAL | Status: DC | PRN

## 2014-10-02 MED ORDER — VITAMIN D 1000 UNITS PO TABS
1000.0000 [IU] | ORAL_TABLET | Freq: Every day | ORAL | Status: DC
  Filled 2014-10-02: qty 1

## 2014-10-02 MED ORDER — OXYCODONE HCL 5 MG PO TABS
5.0000 mg | ORAL_TABLET | ORAL | Status: DC | PRN
  Administered 2014-10-03: 5 mg via ORAL
  Filled 2014-10-02: qty 1

## 2014-10-02 MED ORDER — ONDANSETRON HCL 4 MG PO TABS: 4.0000 mg | ORAL_TABLET | Freq: Four times a day (QID) | ORAL | Status: DC | PRN

## 2014-10-02 MED ORDER — IPRATROPIUM-ALBUTEROL 0.5-2.5 (3) MG/3ML IN SOLN
3.0000 mL | RESPIRATORY_TRACT | Status: DC
  Administered 2014-10-02: 3 mL via RESPIRATORY_TRACT

## 2014-10-02 MED ORDER — PANTOPRAZOLE SODIUM 40 MG PO TBEC
40.0000 mg | DELAYED_RELEASE_TABLET | Freq: Every day | ORAL | Status: DC
  Filled 2014-10-02 (×2): qty 1

## 2014-10-02 MED ORDER — SODIUM CHLORIDE 0.9 % IV SOLN: INTRAVENOUS | Status: AC

## 2014-10-02 MED ORDER — PANTOPRAZOLE SODIUM 40 MG IV SOLR
40.0000 mg | Freq: Every day | INTRAVENOUS | Status: DC
  Administered 2014-10-03 (×2): 40 mg via INTRAVENOUS
  Filled 2014-10-02 (×2): qty 40

## 2014-10-02 MED ORDER — SODIUM CHLORIDE 0.9 % IJ SOLN
3.0000 mL | Freq: Two times a day (BID) | INTRAMUSCULAR | Status: DC
  Administered 2014-10-02 – 2014-10-03 (×2): 3 mL via INTRAVENOUS

## 2014-10-02 MED ORDER — LAMOTRIGINE 100 MG PO TABS
100.0000 mg | ORAL_TABLET | Freq: Every day | ORAL | Status: DC
  Filled 2014-10-02 (×4): qty 1

## 2014-10-02 MED ORDER — POTASSIUM CHLORIDE CRYS ER 10 MEQ PO TBCR
10.0000 meq | EXTENDED_RELEASE_TABLET | Freq: Every day | ORAL | Status: DC
  Filled 2014-10-02: qty 1

## 2014-10-02 NOTE — ED Provider Notes (Signed)
CSN: [id number]     Arrival date & time 10/02/14  1410 History   First MD Initiated Contact with Patient 10/02/14 1417     Chief Complaint  Patient presents with  . Abdominal Pain     (Consider location/radiation/quality/duration/timing/severity/associated sxs/prior Treatment) HPI.... Level V caveat for dementia. Patient presents with lower abdominal pain and hypotension. Known history of abdominal aortic aneurysm with dimension of 6.2-6.7 cm on CT scan 02/24/14. Difficult to obtain further history secondary to patient's dementia.  Past Medical History  Diagnosis Date  . Hypertension   . Memory loss   . Anemia   . Chronic back pain   . Chronic kidney disease   . Anxiety   . Panic attack   . Chronic respiratory failure 04/30/2012  . COPD (chronic obstructive pulmonary disease)   . AAA (abdominal aortic aneurysm)   . Cancer     skin Left wrist BCC  . DVT (deep venous thrombosis)   . Aortic aneurysm, abdominal   . Back injury     has had broken back x 2  . Knee problem     Right knee pain  . Arm fracture, right   . Clostridium difficile enteritis 02/2013    treated   . PMB (postmenopausal bleeding) 03/12/2013  . Depression   . Dementia   . Fall at nursing home Aug. 2015    [facility]   Past Surgical History  Procedure Laterality Date  . Hemorrhoid surgery    . Hip arthroplasty Right 01/08/2013    Procedure: ARTHROPLASTY BIPOLAR HIP;  Surgeon: Nita Sells, MD;  Location: [facility];  Service: Orthopedics;  Laterality: Right;  . Fracture surgery Right January 08, 2013    Hip   Family History  Problem Relation Age of Onset  . Other Mother   . AAA (abdominal aortic aneurysm) Mother   . Heart attack Mother   . Bladder Cancer Sister   . Breast cancer Sister   . Lung cancer Sister   . Colon cancer Brother   . Colon cancer Brother   . Hyperlipidemia Father   . Hypertension Father   . Heart disease Father     Heart Disease before age 37   History   Substance Use Topics  . Smoking status: Former Smoker -- 1.00 packs/day for 63 years    Types: Cigarettes    Quit date: 04/25/2012  . Smokeless tobacco: Never Used     Comment: PATIENT QUIT AFTER FALL ON 04/2012  . Alcohol Use: No   OB History    Gravida Para Term Preterm AB TAB SAB Ectopic Multiple Living   5 5        3      Review of Systems  Unable to perform ROS: Dementia      Allergies  Biaxin; Erythromycin; Imodium a-d; Keflex; Macrodantin; Penicillins; and Sulfa antibiotics  Home Medications   Prior to Admission medications   Medication Sig Start Date End Date Taking? Authorizing Provider  cholecalciferol (VITAMIN D) 1000 UNITS tablet Take 1,000 Units by mouth daily.   Yes Historical Provider, MD  cyanocobalamin (,VITAMIN B-12,) 1000 MCG/ML injection Inject 1,000 mcg into the muscle every 30 (thirty) days.   Yes Historical Provider, MD  dextromethorphan-guaiFENesin (ROBITUSSIN-DM) 10-100 MG/5ML liquid Take 10 mLs by mouth every 4 (four) hours as needed for cough.   Yes Historical Provider, MD  donepezil (ARICEPT) 10 MG tablet Take 10 mg by mouth at bedtime.   Yes Historical Provider, MD  folic acid (  FOLVITE) 1 MG tablet Take 1 mg by mouth daily.   Yes Historical Provider, MD  lamoTRIgine (LAMICTAL) 100 MG tablet Take 100 mg by mouth daily.   Yes Historical Provider, MD  memantine (NAMENDA) 10 MG tablet Take 10 mg by mouth 2 (two) times daily.   Yes Historical Provider, MD  Multiple Vitamin (MULTIVITAMIN WITH MINERALS) TABS Take 1 tablet by mouth daily.   Yes Historical Provider, MD  omeprazole (PRILOSEC) 20 MG capsule Take 20 mg by mouth 2 (two) times daily before a meal. For GERD   Yes Historical Provider, MD  potassium chloride (MICRO-K) 10 MEQ CR capsule Take 40 mEq by mouth daily.   Yes Historical Provider, MD  QUEtiapine (SEROQUEL) 25 MG tablet Take 25 mg by mouth daily. Takes 3 tablets to equal 75 mg twice a day   Yes Historical Provider, MD  QUEtiapine (SEROQUEL)  50 MG tablet Take 50 mg by mouth at bedtime.   Yes Historical Provider, MD  acetaminophen (TYLENOL) 500 MG tablet Take 500 mg by mouth every 6 (six) hours as needed for pain.    Historical Provider, MD  Janne Lab Oil [facility]) OINT Apply topically 3 (three) times daily as needed.    Historical Provider, MD  budesonide (PULMICORT) 0.25 MG/2ML nebulizer solution Take 2 mLs (0.25 mg total) by nebulization 2 (two) times daily. 01/12/13   Barton Dubois, MD  feeding supplement (ENSURE COMPLETE) LIQD Take 237 mLs by mouth 2 (two) times daily between meals. 01/12/13   Barton Dubois, MD  magnesium hydroxide (MILK OF MAGNESIA) 400 MG/5ML suspension Take by mouth daily as needed for mild constipation.    Historical Provider, MD  potassium chloride SA (K-DUR,KLOR-CON) 20 MEQ tablet Take 40 mEq by mouth daily.    Historical Provider, MD  traMADol (ULTRAM) 50 MG tablet Take one tablet by mouth every 6 hours as needed for pain 09/02/14   Lauree Chandler, NP   BP 137/62 mmHg  Pulse 59  Temp(Src) 97.1 F (36.2 C) (Rectal)  Resp 17  Ht 5\' 6"  (1.676 m)  Wt 98 lb (44.453 kg)  BMI 15.83 kg/m2  SpO2 100% Physical Exam  Constitutional:  Frail, demented  HENT:  Head: Normocephalic and atraumatic.  Eyes: Conjunctivae and EOM are normal. Pupils are equal, round, and reactive to light.  Neck: Normal range of motion. Neck supple.  Cardiovascular: Normal rate and regular rhythm.   Pulmonary/Chest: Effort normal and breath sounds normal.  Abdominal:  Minimal lower abdominal tenderness  Musculoskeletal: Normal range of motion.  Neurological:  Conversant, but demented  Skin: Skin is dry.  Psychiatric:  demented  Nursing note and vitals reviewed.   ED Course  Procedures (including critical care time) Labs Review Labs Reviewed  COMPREHENSIVE METABOLIC PANEL - Abnormal; Notable for the following:    Glucose, Bld 141 (*)    BUN 34 (*)    Creatinine, Ser 1.51 (*)    Albumin 3.0 (*)    Alkaline  Phosphatase 134 (*)    GFR calc non Af Amer 30 (*)    GFR calc Af Amer 35 (*)    All other components within normal limits  URINALYSIS, ROUTINE W REFLEX MICROSCOPIC - Abnormal; Notable for the following:    APPearance CLOUDY (*)    Glucose, UA 100 (*)    Hgb urine dipstick SMALL (*)    Protein, ur 100 (*)    Leukocytes, UA LARGE (*)    All other components within normal limits  URINE MICROSCOPIC-ADD ON -  Abnormal; Notable for the following:    Bacteria, UA MANY (*)    All other components within normal limits  POC OCCULT BLOOD, ED - Abnormal; Notable for the following:    Fecal Occult Bld POSITIVE (*)    All other components within normal limits  URINE CULTURE  CBC WITH DIFFERENTIAL/PLATELET  LIPASE, BLOOD    Imaging Review Ct Abdomen Pelvis Wo Contrast  10/02/2014   CLINICAL DATA:  81 year old with dementia and known abdominal aortic aneurysm, presenting with acute onset of abdominal pain. Current history of hypertension, COPD and chronic kidney disease.  EXAM: CT ABDOMEN AND PELVIS WITHOUT CONTRAST  TECHNIQUE: Multidetector CT imaging of the abdomen and pelvis was performed following the standard protocol without IV contrast. Oral contrast was administered.  COMPARISON:  CTA abdomen and pelvis 02/24/2014, 11/26/2012.  FINDINGS: Infrarenal abdominal aortic aneurysm with maximum axial measurements of 7.0 x 7.2 cm (previously 6.6 x 6.9 cm on the 02/24/2014 CT). No evidence of retroperitoneal hemorrhage or hematoma to suggest acute rupture. The aneurysm extends to just above the aortic bifurcation.  Stable chronic elevation of the right hemidiaphragm. Normal unenhanced appearance of the liver, spleen, adrenal glands, and gallbladder. No biliary ductal dilation. Pancreatic atrophy as noted previously.  Atrophic left kidney with cortical cysts arising from the upper pole and the mid kidney, unchanged. Compensatory hypertrophy of the right kidney with a simple cyst arising from the lower pole,  unchanged. No visible lymphadenopathy.  Very large hiatal hernia, unchanged. Normal-appearing small bowel. Large rectosigmoid stool burden. Remainder of the colon relatively decompressed and unremarkable. Normal appendix in the right upper pelvis. No ascites.  Metallic beam hardening streak artifact from the right hip prosthesis obscures the lower pelvis. Visualize urinary bladder unremarkable. Uterus atrophic consistent with age. No visible adnexal masses.  Bone window images demonstrate anatomic alignment of the left hip prosthesis. Numerous compression fracture crash set numerous osteoporotic compression fractures are present including T11, T12, L1, L2 and L4, similar to the most recent prior examination. Visualized lung bases clear apart from chronic scarring in the lower lobes adjacent to the large hiatal hernia.  IMPRESSION: 1. Slight interval increase in size of the infrarenal abdominal aortic aneurysm since the most recent prior examination in September, 2015. Current maximum axial measurements are 7.0 x 7.2 cm (previously 6.6 x 6.9 cm). 2. No evidence of retroperitoneal hemorrhage to suggest aneurysm rupture. 3. Possible rectosigmoid fecal impaction. 4. Stable large hiatal hernia. 5. Stable left renal atrophy. 6. Stable mild pancreatic atrophy.   Electronically Signed   By: Evangeline Dakin M.D.   On: 10/02/2014 18:41     EKG Interpretation   Date/Time:  Saturday October 02 2014 14:21:33 EDT Ventricular Rate:  150 PR Interval:    QRS Duration: 79 QT Interval:  224 QTC Calculation: 354 R Axis:   125 Text Interpretation:  Right and left arm electrode reversal,  interpretation assumes no reversal Atrial fibrillation with rapid V-rate  Paired ventricular premature complexes Right axis deviation Low voltage,  extremity and precordial leads Anteroseptal infarct, old Repolarization  abnormality, prob rate related Confirmed by Geryl Dohn  MD, Kamarie Palma ([id number]) on  10/02/2014 2:42:35 PM      MDM   Final  diagnoses:  Abdominal pain, acute  UTI (lower urinary tract infection)    CODE STATUS discussed with daughter. DO NOT RESUSCITATE orders signed. IV fluids. IV Rocephin for urinary tract infection.. Urine culture. CT scan of abdomen pelvis shows slight interval change in AAA to 7.0-7.2 cm.  No  obvious hemorrhage.    Nat Christen, MD 10/02/14 [phone]

## 2014-10-02 NOTE — ED Notes (Signed)
Pt transport to floor with family following. Prior to leaving ED, daughter approached nursing station and inquired if pt would have disimpaction done upstairs.

## 2014-10-02 NOTE — H&P (Signed)
[facility] Admission History and Physical       DEZARAY SHIBUYA MEQ:[id number] DOB: Jun 08, 1927 DOA: 10/02/2014  Referring physician: EDP PCP: Marjorie Smolder, MD  Specialists:   Chief Complaint: ABD Pain  HPI: [name] is a 81 y.o. female with a history of an inoperable AAA, COPD, HTN, CKD, and Dementia resident of the area SNF who was sent to the ED due to complaints of ABD pain and then decreased responsiveness in the afternoon.   EMS was called and ground her to have hypotension.   She administered IVFs and on evaluation she was found to have a UTI, and was administered IV Rocephin x 1 dose and she began to have improvement.  A ct scan of the ABD was performed which revealed enlargement of her AAA which has been under surveillance, however was deemed inoperable due to her frailty and co-morbidites.   Code status was discussed with her Daughter who is at the bedside and her code status is a Do Not Resuscitate.    The history has been obtained from interview of the patients daughter due to patients severe Dementia.      Review of Systems: Unable to Obtain from the Patient  Past Medical History  Diagnosis Date  . Hypertension   . Memory loss   . Anemia   . Chronic back pain   . Chronic kidney disease   . Anxiety   . Panic attack   . Chronic respiratory failure 04/30/2012  . COPD (chronic obstructive pulmonary disease)   . AAA (abdominal aortic aneurysm)   . Cancer     skin Left wrist BCC  . DVT (deep venous thrombosis)   . Aortic aneurysm, abdominal   . Back injury     has had broken back x 2  . Knee problem     Right knee pain  . Arm fracture, right   . Clostridium difficile enteritis 02/2013    treated   . PMB (postmenopausal bleeding) 03/12/2013  . Depression   . Dementia   . Fall at nursing home Aug. 2015    [facility]     Past Surgical History  Procedure Laterality Date  . Hemorrhoid surgery    . Hip arthroplasty Right 01/08/2013    Procedure:  ARTHROPLASTY BIPOLAR HIP;  Surgeon: Nita Sells, MD;  Location: [facility];  Service: Orthopedics;  Laterality: Right;  . Fracture surgery Right January 08, 2013    Hip      Prior to Admission medications   Medication Sig Start Date End Date Taking? Authorizing Provider  cholecalciferol (VITAMIN D) 1000 UNITS tablet Take 1,000 Units by mouth daily.   Yes Historical Provider, MD  cyanocobalamin (,VITAMIN B-12,) 1000 MCG/ML injection Inject 1,000 mcg into the muscle every 30 (thirty) days.   Yes Historical Provider, MD  dextromethorphan-guaiFENesin (ROBITUSSIN-DM) 10-100 MG/5ML liquid Take 10 mLs by mouth every 4 (four) hours as needed for cough.   Yes Historical Provider, MD  donepezil (ARICEPT) 10 MG tablet Take 10 mg by mouth at bedtime.   Yes Historical Provider, MD  folic acid (FOLVITE) 1 MG tablet Take 1 mg by mouth daily.   Yes Historical Provider, MD  lamoTRIgine (LAMICTAL) 100 MG tablet Take 100 mg by mouth daily.   Yes Historical Provider, MD  memantine (NAMENDA) 10 MG tablet Take 10 mg by mouth 2 (two) times daily.   Yes Historical Provider, MD  Multiple Vitamin (MULTIVITAMIN WITH MINERALS) TABS Take 1 tablet by mouth daily.  Yes Historical Provider, MD  omeprazole (PRILOSEC) 20 MG capsule Take 20 mg by mouth 2 (two) times daily before a meal. For GERD   Yes Historical Provider, MD  potassium chloride (MICRO-K) 10 MEQ CR capsule Take 40 mEq by mouth daily.   Yes Historical Provider, MD  QUEtiapine (SEROQUEL) 25 MG tablet Take 25 mg by mouth daily. Takes 3 tablets to equal 75 mg twice a day   Yes Historical Provider, MD  QUEtiapine (SEROQUEL) 50 MG tablet Take 50 mg by mouth at bedtime.   Yes Historical Provider, MD  acetaminophen (TYLENOL) 500 MG tablet Take 500 mg by mouth every 6 (six) hours as needed for pain.    Historical Provider, MD  Janne Lab Oil [facility]) OINT Apply topically 3 (three) times daily as needed.    Historical Provider, MD  budesonide (PULMICORT)  0.25 MG/2ML nebulizer solution Take 2 mLs (0.25 mg total) by nebulization 2 (two) times daily. 01/12/13   Barton Dubois, MD  feeding supplement (ENSURE COMPLETE) LIQD Take 237 mLs by mouth 2 (two) times daily between meals. 01/12/13   Barton Dubois, MD  magnesium hydroxide (MILK OF MAGNESIA) 400 MG/5ML suspension Take by mouth daily as needed for mild constipation.    Historical Provider, MD  potassium chloride SA (K-DUR,KLOR-CON) 20 MEQ tablet Take 40 mEq by mouth daily.    Historical Provider, MD  traMADol Veatrice Bourbon) 50 MG tablet Take one tablet by mouth every 6 hours as needed for pain 09/02/14   Lauree Chandler, NP     Allergies  Allergen Reactions  . Biaxin [Clarithromycin]   . Erythromycin   . Imodium A-D [Loperamide Hcl] Other (See Comments)    Unsure   . Keflex [Cephalexin]   . Macrodantin [Nitrofurantoin Macrocrystal]   . Penicillins   . Sulfa Antibiotics     Social History:  Resident of SNF, and Bedridden  reports that she quit smoking about 2 years ago. Her smoking use included Cigarettes. She has a 63 pack-year smoking history. She has never used smokeless tobacco. She reports that she does not drink alcohol or use illicit drugs.    Family History  Problem Relation Age of Onset  . Other Mother   . AAA (abdominal aortic aneurysm) Mother   . Heart attack Mother   . Bladder Cancer Sister   . Breast cancer Sister   . Lung cancer Sister   . Colon cancer Brother   . Colon cancer Brother   . Hyperlipidemia Father   . Hypertension Father   . Heart disease Father     Heart Disease before age 64       Physical Exam:  GEN:  Frail Cachectic Elderly 81 y.o. Caucasian female examined and in no acute distress; cooperative with exam Filed Vitals:   10/02/14 1730 10/02/14 1800 10/02/14 1830 10/02/14 1900  BP: 145/68 133/58 111/73 137/62  Pulse: 54 58 58 59  Temp:      TempSrc:      Resp: 14 16 14 17   Height:      Weight:      SpO2: 100% 100% 100% 100%   Blood pressure  137/62, pulse 59, temperature 97.1 F (36.2 C), temperature source Rectal, resp. rate 17, height 5\' 6"  (1.676 m), weight 44.453 kg (98 lb), SpO2 100 %. PSYCH: She is alert and oriented x1; does not appear anxious does not appear depressed; affect is normal HEENT: Normocephalic and Atraumatic, Mucous membranes pink; PERRLA; EOM intact; Fundi:  Benign;  No scleral icterus,  Nares: Patent, Oropharynx: Clear, Edentulous with Dentures,    Neck:  FROM, No Cervical Lymphadenopathy nor Thyromegaly or Carotid Bruit; No JVD; Breasts:: Not examined CHEST WALL: No tenderness CHEST: Normal respiration, clear to auscultation bilaterally HEART: Regular rate and rhythm; no murmurs rubs or gallops BACK: No kyphosis or scoliosis; No CVA tenderness ABDOMEN: Positive Bowel Sounds, Scaphoid, Soft Non-Tender, No Rebound or Guarding; No Masses, No Organomegaly,  Rectal Exam: Not done EXTREMITIES: No Cyanosis, Clubbing, or Edema;   Dystrophic Thickened Toenails, and Erythematous Right 2nd Toe.  Genitalia: not examined PULSES: 2+ and symmetric SKIN: Normal hydration no rash or ulceration CNS:  Alert and Oriented x 1, Generalized Weakness, No Focal Deficits Vascular: pulses palpable throughout    Labs on Admission:  Basic Metabolic Panel:  Recent Labs Lab 10/02/14 1444  NA 140  K 4.6  CL 105  CO2 27  GLUCOSE 141*  BUN 34*  CREATININE 1.51*  CALCIUM 9.4   Liver Function Tests:  Recent Labs Lab 10/02/14 1444  AST 26  ALT 13  ALKPHOS 134*  BILITOT 0.8  PROT 6.9  ALBUMIN 3.0*    Recent Labs Lab 10/02/14 1444  LIPASE 22   No results for input(s): AMMONIA in the last 168 hours. CBC:  Recent Labs Lab 10/02/14 1444  WBC 8.1  NEUTROABS 6.1  HGB 12.0  HCT 37.7  MCV 94.5  PLT 159   Cardiac Enzymes: No results for input(s): CKTOTAL, CKMB, CKMBINDEX, TROPONINI in the last 168 hours.  BNP (last 3 results) No results for input(s): BNP in the last 8760 hours.  ProBNP (last 3 results) No  results for input(s): PROBNP in the last 8760 hours.  CBG: No results for input(s): GLUCAP in the last 168 hours.  Radiological Exams on Admission: Ct Abdomen Pelvis Wo Contrast  10/02/2014   CLINICAL DATA:  81 year old with dementia and known abdominal aortic aneurysm, presenting with acute onset of abdominal pain. Current history of hypertension, COPD and chronic kidney disease.  EXAM: CT ABDOMEN AND PELVIS WITHOUT CONTRAST  TECHNIQUE: Multidetector CT imaging of the abdomen and pelvis was performed following the standard protocol without IV contrast. Oral contrast was administered.  COMPARISON:  CTA abdomen and pelvis 02/24/2014, 11/26/2012.  FINDINGS: Infrarenal abdominal aortic aneurysm with maximum axial measurements of 7.0 x 7.2 cm (previously 6.6 x 6.9 cm on the 02/24/2014 CT). No evidence of retroperitoneal hemorrhage or hematoma to suggest acute rupture. The aneurysm extends to just above the aortic bifurcation.  Stable chronic elevation of the right hemidiaphragm. Normal unenhanced appearance of the liver, spleen, adrenal glands, and gallbladder. No biliary ductal dilation. Pancreatic atrophy as noted previously.  Atrophic left kidney with cortical cysts arising from the upper pole and the mid kidney, unchanged. Compensatory hypertrophy of the right kidney with a simple cyst arising from the lower pole, unchanged. No visible lymphadenopathy.  Very large hiatal hernia, unchanged. Normal-appearing small bowel. Large rectosigmoid stool burden. Remainder of the colon relatively decompressed and unremarkable. Normal appendix in the right upper pelvis. No ascites.  Metallic beam hardening streak artifact from the right hip prosthesis obscures the lower pelvis. Visualize urinary bladder unremarkable. Uterus atrophic consistent with age. No visible adnexal masses.  Bone window images demonstrate anatomic alignment of the left hip prosthesis. Numerous compression fracture crash set numerous osteoporotic  compression fractures are present including T11, T12, L1, L2 and L4, similar to the most recent prior examination. Visualized lung bases clear apart from chronic scarring in the lower lobes adjacent to the large  hiatal hernia.  IMPRESSION: 1. Slight interval increase in size of the infrarenal abdominal aortic aneurysm since the most recent prior examination in September, 2015. Current maximum axial measurements are 7.0 x 7.2 cm (previously 6.6 x 6.9 cm). 2. No evidence of retroperitoneal hemorrhage to suggest aneurysm rupture. 3. Possible rectosigmoid fecal impaction. 4. Stable large hiatal hernia. 5. Stable left renal atrophy. 6. Stable mild pancreatic atrophy.   Electronically Signed   By: Evangeline Dakin M.D.   On: 10/02/2014 18:41       Assessment/Plan:   81 y.o. female with  Principal Problem:   1.   Abdominal pain- Due to UTI, or AAA   Pain Control with IV Dilaudid PRN   IV Cipro   Active Problems:   2.   Hypotension- due to # 2, Sepsis   IVFs     3.   UTI (lower urinary tract infection)   Urine C+S sent   Abx Changed to IV Cipro     4.   AKI (acute kidney injury)   IVFs   Monitor BUN/Cr     5.   Rectal bleeding- FOBT HEME+   Monitor Hemoglobin levels   Send Anemia Panel     6.   Aortic aneurysm, abdominal- Inoperable     7.  COPD (chronic obstructive pulmonary disease) with emphysema   DUONebs   Continue Pulmicort Nebs   O2 PRN  ` Monitor O2 Sats    8.  Dementia   Continue Namenda and Aricept Rx   Continue LAmictal and Seroquel Rx     9.  DVT Prophylaxis   SCDs           Code Status:     DO NOT RESUSCITATE (DNR)      Family Communication:   Daughter at Bedside    Disposition Plan:    Inpatient Status        Time spent:  [place] [facility] Pager [phone]   If [place] Please Contact the Day Rounding Team MD for [facility]  If 7PM-7AM, Please Contact Night-Floor Coverage  [email or web address] Password  [facility] 10/02/2014, 8:32 PM     ADDENDUM:   Patient was seen and examined on 10/02/2014

## 2014-10-02 NOTE — ED Notes (Signed)
Pt  Has reddened area to second toe on right foot with thick yellow toe nails. Also has area of skin on heel to right foot that has peeled from previous skin tear. Blanches, no drainage.

## 2014-10-02 NOTE — ED Notes (Signed)
Patient noted to have another loose stool. Peri care preformed. Hemoccult done. EDP made aware.

## 2014-10-02 NOTE — ED Notes (Signed)
Patient brought in via EMS from [facility]. Patient lethargic but arousable. Per daughter patient started c/o abd pain this morning after eating. Denies any nausea, vomiting, or diarrhea,. Per daughter [facility] staff stated that she had a normal BM this morning. Patient cold to touch. Per EMS personal manual blood pressure 69/49 with HR of 50 with PVCs on monitor. EMS states unable to feel radial pulses. Patient has hx of unoperative abdominal aortic aneurysm.

## 2014-10-03 ENCOUNTER — Inpatient Hospital Stay (HOSPITAL_COMMUNITY): Payer: Medicare Other

## 2014-10-03 DIAGNOSIS — I714 Abdominal aortic aneurysm, without rupture: Secondary | ICD-10-CM

## 2014-10-03 DIAGNOSIS — F0390 Unspecified dementia without behavioral disturbance: Secondary | ICD-10-CM

## 2014-10-03 DIAGNOSIS — A047 Enterocolitis due to Clostridium difficile: Secondary | ICD-10-CM

## 2014-10-03 LAB — CBC
HCT: 34.3 % — ABNORMAL LOW (ref 36.0–46.0)
HEMOGLOBIN: 11.2 g/dL — AB (ref 12.0–15.0)
MCH: 30.2 pg (ref 26.0–34.0)
MCHC: 32.7 g/dL (ref 30.0–36.0)
MCV: 92.5 fL (ref 78.0–100.0)
PLATELETS: 162 10*3/uL (ref 150–400)
RBC: 3.71 MIL/uL — AB (ref 3.87–5.11)
RDW: 15.6 % — ABNORMAL HIGH (ref 11.5–15.5)
WBC: 10.6 10*3/uL — AB (ref 4.0–10.5)

## 2014-10-03 LAB — IRON AND TIBC
IRON: 42 ug/dL (ref 42–145)
Saturation Ratios: 27 % (ref 20–55)
TIBC: 154 ug/dL — ABNORMAL LOW (ref 250–470)
UIBC: 112 ug/dL — ABNORMAL LOW (ref 125–400)

## 2014-10-03 LAB — BASIC METABOLIC PANEL
ANION GAP: 7 (ref 5–15)
BUN: 25 mg/dL — ABNORMAL HIGH (ref 6–23)
CALCIUM: 8.3 mg/dL — AB (ref 8.4–10.5)
CO2: 23 mmol/L (ref 19–32)
CREATININE: 1.14 mg/dL — AB (ref 0.50–1.10)
Chloride: 108 mmol/L (ref 96–112)
GFR calc non Af Amer: 43 mL/min — ABNORMAL LOW (ref >90)
GFR, EST AFRICAN AMERICAN: 49 mL/min — AB (ref >90)
Glucose, Bld: 71 mg/dL (ref 70–99)
Potassium: 4.1 mmol/L (ref 3.5–5.1)
Sodium: 138 mmol/L (ref 135–145)

## 2014-10-03 LAB — CLOSTRIDIUM DIFFICILE BY PCR: Toxigenic C. Difficile by PCR: POSITIVE — AB

## 2014-10-03 LAB — RETICULOCYTES
RBC.: 3.71 MIL/uL — ABNORMAL LOW (ref 3.87–5.11)
RETIC COUNT ABSOLUTE: 59.4 10*3/uL (ref 19.0–186.0)
Retic Ct Pct: 1.6 % (ref 0.4–3.1)

## 2014-10-03 LAB — MRSA PCR SCREENING: MRSA by PCR: NEGATIVE

## 2014-10-03 MED ORDER — IPRATROPIUM-ALBUTEROL 0.5-2.5 (3) MG/3ML IN SOLN: 3.0000 mL | RESPIRATORY_TRACT | Status: DC | PRN

## 2014-10-03 MED ORDER — METRONIDAZOLE IN NACL 5-0.79 MG/ML-% IV SOLN
500.0000 mg | Freq: Three times a day (TID) | INTRAVENOUS | Status: DC
  Administered 2014-10-03 – 2014-10-04 (×3): 500 mg via INTRAVENOUS
  Filled 2014-10-03 (×3): qty 100

## 2014-10-03 MED ORDER — CHLORHEXIDINE GLUCONATE 0.12 % MT SOLN
15.0000 mL | Freq: Two times a day (BID) | OROMUCOSAL | Status: DC
  Administered 2014-10-03: 15 mL via OROMUCOSAL
  Filled 2014-10-03 (×2): qty 15

## 2014-10-03 MED ORDER — DEXTROSE 5 % IV SOLN
1.0000 g | Freq: Once | INTRAVENOUS | Status: AC
  Administered 2014-10-03: 1 g via INTRAVENOUS
  Filled 2014-10-03: qty 10

## 2014-10-03 MED ORDER — CETYLPYRIDINIUM CHLORIDE 0.05 % MT LIQD
7.0000 mL | Freq: Two times a day (BID) | OROMUCOSAL | Status: DC
  Administered 2014-10-03 – 2014-10-04 (×4): 7 mL via OROMUCOSAL

## 2014-10-03 MED ORDER — METRONIDAZOLE 500 MG PO TABS
500.0000 mg | ORAL_TABLET | Freq: Three times a day (TID) | ORAL | Status: DC
  Filled 2014-10-03: qty 1

## 2014-10-03 NOTE — Progress Notes (Signed)
Patient very combative and spit all of her medications out.

## 2014-10-03 NOTE — Progress Notes (Signed)
Lungs appear clear on Cat Scan, Breath sounds appear clear, Will stop duo neb scheduled to PRN.

## 2014-10-03 NOTE — Progress Notes (Signed)
Utilization review Completed Kallin Henk RN BSN   

## 2014-10-03 NOTE — Progress Notes (Signed)
Patient spit out all PO medications this morning and yelled "Get out of here!" MD notified.

## 2014-10-03 NOTE — Progress Notes (Signed)
[facility]                                                                              Patient Demographics  [name], is a 81 y.o. female, DOB - 1926-07-08, BJS:[id number]  Admit date - 10/02/2014   Admitting Physician Theressa Millard, MD  Outpatient Primary MD for the patient is Marjorie Smolder, MD  LOS - 1   Chief Complaint  Patient presents with  . Abdominal Pain      HPI on 10/02/2014 by Dr. Jana Hakim [name] is a 81 y.o. female with a history of an inoperable AAA, COPD, HTN, CKD, and Dementia resident of the area SNF who was sent to the ED due to complaints of ABD pain and then decreased responsiveness in the afternoon. EMS was called and ground her to have hypotension. She administered IVFs and on evaluation she was found to have a UTI, and was administered IV Rocephin x 1 dose and she began to have improvement. A ct scan of the ABD was performed which revealed enlargement of her AAA which has been under surveillance, however was deemed inoperable due to her frailty and co-morbidites. Code status was discussed with her Daughter who is at the bedside and her code status is a Do Not Resuscitate.The history has been obtained from interview of the patients daughter due to patients severe Dementia.   Assessment & Plan  Abdominal pain secondary to UTI/Cdiff -Per patient (who has dementia), she currently does not have abdominal pain or diarrhea -Cdiff PCR positive, will start flagyl 500mg  TID IV -CT of the abdomen shows slight increase in AAA, possible rectosigmoid fecal impaction -UA: TNTC WBC, many bacteria, large leukocytes  -Urine culture pending -Continue ceftriaxone (will discontinue cipro)  CKD, stage 3 -Creatinine appears to be stable, currently 1.14 -Continue to montior BMP  Dementia -Continue Namenda, Aricept, Lamictal and Seroquel  AAA -Increasing in size on Abdominal CT- 7.0 X 7.2cm (previously 6.6 x 6.9cm) -however inoperable                                                                                                                                                                                 Rectal bleeding -Hemoglobin appears to be stable (baseline 11) -Anemia panel pending  COPD with emphysema -Appears to be stable, no wheezing noted -Continue nebulizer treatments as needed, supplemental oxygen  to maintain saturations above 92%                                                                                                                                                                                                       Code Status: DNR  Family Communication: None at bedside  Disposition Plan: Admitted, likely will be discharged back to [facility] 10/07/2014  Time Spent in minutes   30 minutes  Procedures  None  Consults   None  DVT Prophylaxis  SCDs  Lab Results  Component Value Date   PLT 162 10/03/2014    Medications  Scheduled Meds: . sodium chloride   Intravenous STAT  . antiseptic oral rinse  7 mL Mouth Rinse q12n4p  . budesonide  0.25 mg Nebulization BID  . chlorhexidine  15 mL Mouth Rinse BID  . cholecalciferol  1,000 Units Oral Daily  . ciprofloxacin  200 mg Intravenous Q12H  . cyanocobalamin  1,000 mcg Intramuscular Q30 days  . donepezil  10 mg Oral QHS  . feeding supplement (ENSURE ENLIVE)  237 mL Oral BID BM  . folic acid  1 mg Oral Daily  . lamoTRIgine  100 mg Oral Daily  . memantine  10 mg Oral BID  . metroNIDAZOLE  500 mg Oral 3 times per day  . multivitamin with minerals  1 tablet Oral Daily  . potassium chloride  10 mEq Oral Daily  . QUEtiapine  25 mg Oral Daily  . sodium chloride  3 mL Intravenous Q12H   Continuous Infusions: . sodium chloride 75 mL/hr at 10/03/14 0328   PRN Meds:.acetaminophen **OR** acetaminophen, alum & mag hydroxide-simeth, HYDROmorphone (DILAUDID) injection, ipratropium-albuterol, ondansetron **OR** ondansetron (ZOFRAN) IV,  oxyCODONE  Antibiotics    Anti-infectives    Start     Dose/Rate Route Frequency Ordered Stop   10/03/14 1000  ciprofloxacin (CIPRO) IVPB 200 mg     200 mg 100 mL/hr over 60 Minutes Intravenous Every 12 hours 10/02/14 2039     10/03/14 0730  metroNIDAZOLE (FLAGYL) tablet 500 mg     500 mg Oral 3 times per day 10/03/14 0718 10/17/14 0559   10/02/14 1615  cefTRIAXone (ROCEPHIN) 1 g in dextrose 5 % 50 mL IVPB     1 g 100 mL/hr over 30 Minutes Intravenous  Once 10/02/14 1611 10/02/14 1700        Subjective:   Brandy Hale seen and examined today. Patient has dementia.  States "you're pretty, I love you."  Denies abdominal pain, diarrhea.  States she wants to sleep.  Objective:  Filed Vitals:   10/02/14 2058 10/02/14 2352 10/03/14 0720 10/03/14 1053  BP:  127/60 114/52   Pulse:  108 60   Temp:  97.7 F (36.5 C) 98 F (36.7 C)   TempSrc:  Tympanic Axillary   Resp:  18 18   Height:  5\' 6"  (1.676 m)    Weight:  44 kg (97 lb)    SpO2: 98% 95% 96% 99%    Wt Readings from Last 3 Encounters:  10/02/14 44 kg (97 lb)  02/24/14 51.256 kg (113 lb)  02/09/14 53.524 kg (118 lb)     Intake/Output Summary (Last 24 hours) at 10/03/14 1212 Last data filed at 10/02/14 1510  Gross per 24 hour  Intake      0 ml  Output     15 ml  Net    -15 ml    Exam  General: Well developed, thin, elderly, no distress  HEENT: NCAT,mucous membranes moist.   Cardiovascular: S1 S2 auscultated, no rubs, murmurs or gallops. Regular rate and rhythm.  Respiratory: Clear to auscultation bilaterally with equal chest rise  Abdomen: Soft, nontender, nondistended, + bowel sounds  Extremities: warm dry without cyanosis clubbing or edema, right 2nd toe erythema  Neuro: Unable to fully assess due to dementia.  Patient does not answer all questions appropriately or follow directions.   Data Review   Micro Results Recent Results (from the past 240 hour(s))  Clostridium Difficile by PCR     Status:  Abnormal   Collection Time: 10/03/14 12:45 AM  Result Value Ref Range Status   C difficile by pcr POSITIVE (A) NEGATIVE Final    Comment: CRITICAL RESULT CALLED TO, READ BACK BY AND VERIFIED WITH:  THOMAS,C @ 0205 ON 10/03/14 BY WOODIE,J   MRSA PCR Screening     Status: None   Collection Time: 10/03/14 12:50 AM  Result Value Ref Range Status   MRSA by PCR NEGATIVE NEGATIVE Final    Comment:        The GeneXpert MRSA Assay (FDA approved for NASAL specimens only), is one component of a comprehensive MRSA colonization surveillance program. It is not intended to diagnose MRSA infection nor to guide or monitor treatment for MRSA infections.     Radiology Reports Ct Abdomen Pelvis Wo Contrast  10/02/2014   CLINICAL DATA:  81 year old with dementia and known abdominal aortic aneurysm, presenting with acute onset of abdominal pain. Current history of hypertension, COPD and chronic kidney disease.  EXAM: CT ABDOMEN AND PELVIS WITHOUT CONTRAST  TECHNIQUE: Multidetector CT imaging of the abdomen and pelvis was performed following the standard protocol without IV contrast. Oral contrast was administered.  COMPARISON:  CTA abdomen and pelvis 02/24/2014, 11/26/2012.  FINDINGS: Infrarenal abdominal aortic aneurysm with maximum axial measurements of 7.0 x 7.2 cm (previously 6.6 x 6.9 cm on the 02/24/2014 CT). No evidence of retroperitoneal hemorrhage or hematoma to suggest acute rupture. The aneurysm extends to just above the aortic bifurcation.  Stable chronic elevation of the right hemidiaphragm. Normal unenhanced appearance of the liver, spleen, adrenal glands, and gallbladder. No biliary ductal dilation. Pancreatic atrophy as noted previously.  Atrophic left kidney with cortical cysts arising from the upper pole and the mid kidney, unchanged. Compensatory hypertrophy of the right kidney with a simple cyst arising from the lower pole, unchanged. No visible lymphadenopathy.  Very large hiatal hernia,  unchanged. Normal-appearing small bowel. Large rectosigmoid stool burden. Remainder of the colon relatively decompressed and unremarkable. Normal appendix in the right upper pelvis.  No ascites.  Metallic beam hardening streak artifact from the right hip prosthesis obscures the lower pelvis. Visualize urinary bladder unremarkable. Uterus atrophic consistent with age. No visible adnexal masses.  Bone window images demonstrate anatomic alignment of the left hip prosthesis. Numerous compression fracture crash set numerous osteoporotic compression fractures are present including T11, T12, L1, L2 and L4, similar to the most recent prior examination. Visualized lung bases clear apart from chronic scarring in the lower lobes adjacent to the large hiatal hernia.  IMPRESSION: 1. Slight interval increase in size of the infrarenal abdominal aortic aneurysm since the most recent prior examination in September, 2015. Current maximum axial measurements are 7.0 x 7.2 cm (previously 6.6 x 6.9 cm). 2. No evidence of retroperitoneal hemorrhage to suggest aneurysm rupture. 3. Possible rectosigmoid fecal impaction. 4. Stable large hiatal hernia. 5. Stable left renal atrophy. 6. Stable mild pancreatic atrophy.   Electronically Signed   By: Evangeline Dakin M.D.   On: 10/02/2014 18:41    CBC  Recent Labs Lab 10/02/14 1444 10/03/14 0610  WBC 8.1 10.6*  HGB 12.0 11.2*  HCT 37.7 34.3*  PLT 159 162  MCV 94.5 92.5  MCH 30.1 30.2  MCHC 31.8 32.7  RDW 15.5 15.6*  LYMPHSABS 1.4  --   MONOABS 0.6  --   EOSABS 0.0  --   BASOSABS 0.0  --     Chemistries   Recent Labs Lab 10/02/14 1444 10/03/14 0610  NA 140 138  K 4.6 4.1  CL 105 108  CO2 27 23  GLUCOSE 141* 71  BUN 34* 25*  CREATININE 1.51* 1.14*  CALCIUM 9.4 8.3*  AST 26  --   ALT 13  --   ALKPHOS 134*  --   BILITOT 0.8  --    ------------------------------------------------------------------------------------------------------------------ estimated  creatinine clearance is 25.1 mL/min (by C-G formula based on Cr of 1.14). ------------------------------------------------------------------------------------------------------------------ No results for input(s): HGBA1C in the last 72 hours. ------------------------------------------------------------------------------------------------------------------ No results for input(s): CHOL, HDL, LDLCALC, TRIG, CHOLHDL, LDLDIRECT in the last 72 hours. ------------------------------------------------------------------------------------------------------------------ No results for input(s): TSH, T4TOTAL, T3FREE, THYROIDAB in the last 72 hours.  Invalid input(s): FREET3 ------------------------------------------------------------------------------------------------------------------  Recent Labs  10/03/14 0610  RETICCTPCT 1.6    Coagulation profile No results for input(s): INR, PROTIME in the last 168 hours.  No results for input(s): DDIMER in the last 72 hours.  Cardiac Enzymes No results for input(s): CKMB, TROPONINI, MYOGLOBIN in the last 168 hours.  Invalid input(s): CK ------------------------------------------------------------------------------------------------------------------ Invalid input(s): POCBNP    Farhana Fellows D.O. on 10/03/2014 at 12:12 PM  Between 7am to 7pm - Pager - [phone]  After 7pm go to [email or web address] - password [email or web address]  And look for the night coverage person covering for me after hours  [facility] Office  [phone]

## 2014-10-03 NOTE — Consult Note (Signed)
WOC wound consult note Reason for Consult: Requested for assessment and suggestions for right 2nd toe which presented with erythema yesterday, none at present.  A Black small eschar, probably infarct vs pressure, is noted over the hallux/1st metatarsal head and it is stable. Feet with poor hygiene, long nails in need of debridement. Wound type:reactive hyperemia, no wound present Pressure Ulcer POA: No Measurement: Eschar measures 0.2x 0.2 round  Wound ZDG:UYQI Drainage (amount, consistency, odor) None Periwound:As noted above Dressing procedure/placement/frequency:I will provide guidance to the nursing staff for hygenic care to the bilateral fee and provide pressure redistribution boots (Prevalon) for comfort and protection from pressure or other injury. [place] nursing team will not follow, but will remain available to this patient, the nursing and medical teams.  Please re-consult if needed. Thanks, Maudie Flakes, MSN, RN, [facility], [place], [place] [phone])

## 2014-10-04 ENCOUNTER — Inpatient Hospital Stay
Admission: RE | Admit: 2014-10-04 | Discharge: 2015-06-12 | Disposition: E | Payer: Medicare Other | Source: Ambulatory Visit | Attending: Internal Medicine | Admitting: Internal Medicine

## 2014-10-04 DIAGNOSIS — L539 Erythematous condition, unspecified: Secondary | ICD-10-CM | POA: Insufficient documentation

## 2014-10-04 DIAGNOSIS — F0391 Unspecified dementia with behavioral disturbance: Secondary | ICD-10-CM

## 2014-10-04 DIAGNOSIS — R059 Cough, unspecified: Secondary | ICD-10-CM

## 2014-10-04 DIAGNOSIS — J189 Pneumonia, unspecified organism: Secondary | ICD-10-CM

## 2014-10-04 DIAGNOSIS — R229 Localized swelling, mass and lump, unspecified: Principal | ICD-10-CM

## 2014-10-04 DIAGNOSIS — R109 Unspecified abdominal pain: Secondary | ICD-10-CM | POA: Insufficient documentation

## 2014-10-04 DIAGNOSIS — R05 Cough: Secondary | ICD-10-CM

## 2014-10-04 DIAGNOSIS — R100 Acute abdomen: Secondary | ICD-10-CM

## 2014-10-04 DIAGNOSIS — IMO0002 Reserved for concepts with insufficient information to code with codable children: Principal | ICD-10-CM

## 2014-10-04 DIAGNOSIS — R509 Fever, unspecified: Secondary | ICD-10-CM

## 2014-10-04 LAB — VITAMIN B12

## 2014-10-04 LAB — CBC
HEMATOCRIT: 31.4 % — AB (ref 36.0–46.0)
Hemoglobin: 10.3 g/dL — ABNORMAL LOW (ref 12.0–15.0)
MCH: 30.2 pg (ref 26.0–34.0)
MCHC: 32.8 g/dL (ref 30.0–36.0)
MCV: 92.1 fL (ref 78.0–100.0)
Platelets: 140 10*3/uL — ABNORMAL LOW (ref 150–400)
RBC: 3.41 MIL/uL — ABNORMAL LOW (ref 3.87–5.11)
RDW: 15.5 % (ref 11.5–15.5)
WBC: 7.5 10*3/uL (ref 4.0–10.5)

## 2014-10-04 LAB — URINE CULTURE: Colony Count: 50000

## 2014-10-04 LAB — FOLATE: Folate: >20 ng/mL

## 2014-10-04 LAB — FERRITIN: FERRITIN: 400 ng/mL — AB (ref 10–291)

## 2014-10-04 MED ORDER — VANCOMYCIN 50 MG/ML ORAL SOLUTION
125.0000 mg | Freq: Four times a day (QID) | ORAL | Status: DC
  Administered 2014-10-04: 125 mg via ORAL
  Filled 2014-10-04 (×9): qty 2.5

## 2014-10-04 MED ORDER — VANCOMYCIN 50 MG/ML ORAL SOLUTION: 125.0000 mg | Freq: Four times a day (QID) | ORAL | Status: DC

## 2014-10-04 MED ORDER — POTASSIUM CHLORIDE CRYS ER 10 MEQ PO TBCR: 10.0000 meq | EXTENDED_RELEASE_TABLET | Freq: Every day | ORAL | Status: AC

## 2014-10-04 MED ORDER — FOSFOMYCIN TROMETHAMINE 3 G PO PACK
3.0000 g | PACK | Freq: Once | ORAL | Status: AC
  Administered 2014-10-04: 3 g via ORAL
  Filled 2014-10-04: qty 3

## 2014-10-04 NOTE — Care Management Note (Signed)
    Page 1 of 1   09/28/2014     2:58:40 PM CARE MANAGEMENT NOTE 10/06/2014  Patient:  [name], [name]   Account Number:  [id number]  Date Initiated:  10/05/2014  Documentation initiated by:  CHILDRESS,JESSICA  Subjective/Objective Assessment:   Pt is from [facility]. Pt admitted for abdoninal pain secondary to Cdiff, UTI, AAA. Pt plans to discharge back to [facility] SNF today. CSW is aware and will arrange for return to facility. No CM needs.     Action/Plan:   Anticipated DC Date:  09/21/2014   Anticipated DC Plan:  SKILLED NURSING FACILITY  In-house referral  Clinical Social Worker      DC Planning Services  CM consult      Choice offered to / List presented to:             Status of service:  Completed, signed off Medicare Important Message given?   (If response is "NO", the following Medicare IM given date fields will be blank) Date Medicare IM given:   Medicare IM given by:   Date Additional Medicare IM given:   Additional Medicare IM given by:    Discharge Disposition:  [place]  Per UR Regulation:    If discussed at Long Length of Stay Meetings, dates discussed:    Comments:  09/27/2014 Hannah, RN, MSN, CM

## 2014-10-04 NOTE — Progress Notes (Signed)
[place] and report given to [place], [place]. Patient transferred to [facility] via bed by staff this shift, daughter present. D/C paperwork including signed DNR sent with patient, Estill Bamberg LPN confirmed receiving. Foot and toe nail care provided to patient prior to d/c to [facility]. IV catheter removed from RIGHT arm, catheter tip intact, no s/s of infection noted.

## 2014-10-04 NOTE — Clinical Social Work Note (Signed)
Clinical Social Work Assessment  Patient Details  Name: [name] MRN: [id number] Date of Birth: 29-Sep-1926  Date of referral:  10/03/2014               Reason for consult:  Facility Placement                Permission sought to share information with:  Other Permission granted to share information::  Yes, Verbal Permission Granted  Name::        Agency::  [facility]  Relationship::     Contact Information:     Housing/Transportation Living arrangements for the past 2 months:  [place], [place] of Information:  Adult Children Patient Interpreter Needed:  None Criminal Activity/Legal Involvement Pertinent to Current Situation/Hospitalization:  No - Comment as needed Significant Relationships:  Adult Children Lives with:  Facility Resident Do you feel safe going back to the place where you live?  Yes Need for family participation in patient care:  Yes (Comment)  Care giving concerns:  SNF return     Social Worker assessment / plan:  CSW spoke with SNF rep as well as patient's daughter, [name], at bedside who states her mother has resided at [facility] since 2014. "she went there after a hip fracture and has never recovered". Daughter agrees to plans for her to return to SNF today and CSW has confirmed plans with SNF also.  Employment status:  Retired Forensic scientist:  Medicare PT Recommendations:  [place] / Referral to community resources:  [place]  Patient/Family's Response to care:  Daughter has brought cornbread, buttermilk and some toenail clippers- "Her toes have not been tended to". She has asked the RN to assist with clipping these prior to her return to SNF and also plans to discuss with the SNF rep her concerns that they were not clipped sooner.   Patient/Family's Understanding of and Emotional Response to Diagnosis, Current Treatment, and Prognosis:  Daughter appears very nurturing and  familiar with her mother's needs and what works best for her- "I brought a sprite to help her take her medicinces".  She has been pleased with the care at [facility] and is prepared for her to return today. She is aware of her cdiff (chronic per her report) and plans for ongoing treatment.  Emotional Assessment Appearance:  Appears stated age Attitude/Demeanor/Rapport:  Unresponsive, Lethargic Affect (typically observed):  Withdrawn Orientation:  Oriented to Self Alcohol / Substance use:    Psych involvement (Current and /or in the community):  No (Comment)  Discharge Needs  Concerns to be addressed:  No discharge needs identified Readmission within the last 30 days:  No Current discharge risk:  None Barriers to Discharge:  No Barriers Identified   Ludwig Clarks, LCSW 09/27/2014, 2:37 PM

## 2014-10-04 NOTE — Progress Notes (Signed)
0831 Patient refused to take all PO medications this morning. MD notified.

## 2014-10-04 NOTE — Discharge Summary (Signed)
Physician Discharge Summary  [name] BOF:[id number] DOB: Mar 05, 1927 DOA: 10/02/2014  PCP: Marjorie Smolder, MD  Admit date: 10/02/2014 Discharge date: 09/12/2014  Time spent: 45 minutes  Recommendations for Outpatient Follow-up:  Patient will be discharged to [facility].  Patient will need to follow up with primary care provider within one week of discharge.  Patient should continue medications as prescribed.  Patient should follow a regular diet.   Discharge Diagnoses:  Abdominal pain secondary to UTI/Cdiff/AAA PKD, stage III Dementia AAA Rectal bleeding/Normocytic Anemia COPD with emphysema  Discharge Condition: stable  Diet recommendation: regular  Filed Weights   10/02/14 1416 10/02/14 2352  Weight: 44.453 kg (98 lb) 44 kg (97 lb)    History of present illness:  on 10/02/2014 by Dr. Debbrah Key is a 81 y.o. female with a history of an inoperable AAA, COPD, HTN, CKD, and Dementia resident of the area SNF who was sent to the ED due to complaints of ABD pain and then decreased responsiveness in the afternoon. EMS was called and ground her to have hypotension. She administered IVFs and on evaluation she was found to have a UTI, and was administered IV Rocephin x 1 dose and she began to have improvement. A ct scan of the ABD was performed which revealed enlargement of her AAA which has been under surveillance, however was deemed inoperable due to her frailty and co-morbidites. Code status was discussed with her Daughter who is at the bedside and her code status is a Do Not Resuscitate.The history has been obtained from interview of the patients daughter due to patients severe Dementia.   Hospital Course:  Abdominal pain secondary to UTI/Cdiff -Per patient (who has dementia), she currently does not have abdominal pain or diarrhea -Cdiff PCR positive, initially placed on flaygl PO but would not swallow her pills, then transitioned to  IC -CT of the abdomen shows slight increase in AAA, possible rectosigmoid fecal impaction -UA: TNTC WBC, many bacteria, large leukocytes  -Urine culture: multiple bacterial morphotypes, 50Kcolonies -Patient initially placed on cipro, transitioned to ceftriaxone, but will give 1 dose of fosfomycin to cover UTI -Patient refuses to take PO medications, and PICC line is not the best option as patient will more than likely pull it out.   -Discussed with the physician at [facility], Dr. Dellia Nims, patient has not had a positive Cdiff since March 2015- so unlikely a colonizer -Will discharge patient with Vancomycin 125mg  liquid (to be mixed with fluid) q6hours for an additional 12 days (total of 14 days of antibiotics)  CKD, stage 3 -Creatinine appears to be stable, currently 1.14  Dementia -Continue Namenda, Aricept, Lamictal and Seroquel -Patient continues to refuse her medicines   AAA -Increasing in size on Abdominal CT- 7.0 X 7.2cm (previously 6.6 x 6.9cm) -however inoperable   Rectal bleeding/ Normocytic Anemia -Hemoglobin appears to be stable (baseline 11) -Anemia panel: Iron 42, Ferritin 400   COPD with emphysema -Appears to be stable, no wheezing noted -Continue nebulizer treatments as needed, supplemental oxygen to maintain saturations above 92%   Procedures: None  Consultations: None  Discharge Exam: Filed Vitals:   09/26/2014 0624  BP: 102/52  Pulse: 58  Temp: 98.5 F (36.9 C)  Resp: 16     General: Well developed, thin, no distress  HEENT: NCAT, mucous membranes moist.  Cardiovascular: RRR, no murmurs, normal S1/S2  Respiratory: Clear to auscultation  bilaterally with equal chest rise  Abdomen: Soft, nontender, nondistended, + bowel sounds  Extremities: warm dry without cyanosis clubbing or edema  Discharge Instructions      Discharge Instructions    Discharge instructions    Complete by:  As directed   Patient will be discharged to [facility].  Patient will need to follow up with primary care provider within one week of discharge.  Patient should continue medications as prescribed.  Patient should follow a regular diet.            Medication List    STOP taking these medications        potassium chloride 10 MEQ CR capsule  Commonly known as:  MICRO-K  Replaced by:  potassium chloride 10 MEQ tablet     potassium chloride SA 20 MEQ tablet  Commonly known as:  K-DUR,KLOR-CON      TAKE these medications        acetaminophen 500 MG tablet  Commonly known as:  TYLENOL  Take 500 mg by mouth every 6 (six) hours as needed for pain.     budesonide 0.25 MG/2ML nebulizer solution  Commonly known as:  PULMICORT  Take 2 mLs (0.25 mg total) by nebulization 2 (two) times daily.     cholecalciferol 1000 UNITS tablet  Commonly known as:  VITAMIN D  Take 1,000 Units by mouth daily.     cyanocobalamin 1000 MCG/ML injection  Commonly known as:  (VITAMIN B-12)  Inject 1,000 mcg into the muscle every 30 (thirty) days.     dextromethorphan-guaiFENesin 10-100 MG/5ML liquid  Commonly known as:  ROBITUSSIN-DM  Take 10 mLs by mouth every 4 (four) hours as needed for cough.     donepezil 10 MG tablet  Commonly known as:  ARICEPT  Take 10 mg by mouth at bedtime.     feeding supplement (ENSURE COMPLETE) Liqd  Take 237 mLs by mouth 2 (two) times daily between meals.     folic acid 1 MG tablet  Commonly known as:  FOLVITE  Take 1 mg by mouth daily.     lamoTRIgine 100 MG tablet  Commonly known as:  LAMICTAL  Take 100 mg by mouth daily.     magnesium hydroxide 400 MG/5ML suspension  Commonly known as:  MILK OF  MAGNESIA  Take by mouth daily as needed for mild constipation.     memantine 10 MG tablet  Commonly known as:  NAMENDA  Take 10 mg by mouth 2 (two) times daily.     multivitamin with minerals Tabs tablet  Take 1 tablet by mouth daily.     omeprazole 20 MG capsule  Commonly known as:  PRILOSEC  Take 20 mg by mouth 2 (two) times daily before a meal. For GERD     potassium chloride 10 MEQ tablet  Commonly known as:  K-DUR,KLOR-CON  Take 1 tablet (10 mEq total) by mouth daily.     QUEtiapine 25 MG tablet  Commonly known as:  SEROQUEL  Take 25 mg by mouth daily.     QUEtiapine 50 MG tablet  Commonly known as:  SEROQUEL  Take 50 mg by mouth at bedtime.     traMADol 50 MG tablet  Commonly known as:  ULTRAM  Take one tablet by mouth every 6 hours as needed for pain     vancomycin 50 mg/mL oral solution  Commonly known as:  VANCOCIN  Take 2.5 mLs (125 mg total) by mouth every 6 (six) hours.     VENELEX Oint  Apply topically 3 (three) times daily as needed.  Allergies  Allergen Reactions  . Biaxin [Clarithromycin]   . Erythromycin   . Imodium A-D [Loperamide Hcl] Other (See Comments)    Unsure   . Keflex [Cephalexin]   . Macrodantin [Nitrofurantoin Macrocrystal]   . Penicillins   . Sulfa Antibiotics    Follow-up Information    Follow up with Marjorie Smolder, MD. Schedule an appointment as soon as possible for a visit in 1 week.   Specialty:  Family Medicine   Why:  Hospital follow up.   Contact information:   [place] [address] [phone]        The results of significant diagnostics from this hospitalization (including imaging, microbiology, ancillary and laboratory) are listed below for reference.    Significant Diagnostic Studies: Ct Abdomen Pelvis Wo Contrast  10/02/2014   CLINICAL DATA:  81 year old with dementia and known abdominal aortic aneurysm, presenting with acute onset of abdominal pain. Current history  of hypertension, COPD and chronic kidney disease.  EXAM: CT ABDOMEN AND PELVIS WITHOUT CONTRAST  TECHNIQUE: Multidetector CT imaging of the abdomen and pelvis was performed following the standard protocol without IV contrast. Oral contrast was administered.  COMPARISON:  CTA abdomen and pelvis 02/24/2014, 11/26/2012.  FINDINGS: Infrarenal abdominal aortic aneurysm with maximum axial measurements of 7.0 x 7.2 cm (previously 6.6 x 6.9 cm on the 02/24/2014 CT). No evidence of retroperitoneal hemorrhage or hematoma to suggest acute rupture. The aneurysm extends to just above the aortic bifurcation.  Stable chronic elevation of the right hemidiaphragm. Normal unenhanced appearance of the liver, spleen, adrenal glands, and gallbladder. No biliary ductal dilation. Pancreatic atrophy as noted previously.  Atrophic left kidney with cortical cysts arising from the upper pole and the mid kidney, unchanged. Compensatory hypertrophy of the right kidney with a simple cyst arising from the lower pole, unchanged. No visible lymphadenopathy.  Very large hiatal hernia, unchanged. Normal-appearing small bowel. Large rectosigmoid stool burden. Remainder of the colon relatively decompressed and unremarkable. Normal appendix in the right upper pelvis. No ascites.  Metallic beam hardening streak artifact from the right hip prosthesis obscures the lower pelvis. Visualize urinary bladder unremarkable. Uterus atrophic consistent with age. No visible adnexal masses.  Bone window images demonstrate anatomic alignment of the left hip prosthesis. Numerous compression fracture crash set numerous osteoporotic compression fractures are present including T11, T12, L1, L2 and L4, similar to the most recent prior examination. Visualized lung bases clear apart from chronic scarring in the lower lobes adjacent to the large hiatal hernia.  IMPRESSION: 1. Slight interval increase in size of the infrarenal abdominal aortic aneurysm since the most recent  prior examination in September, 2015. Current maximum axial measurements are 7.0 x 7.2 cm (previously 6.6 x 6.9 cm). 2. No evidence of retroperitoneal hemorrhage to suggest aneurysm rupture. 3. Possible rectosigmoid fecal impaction. 4. Stable large hiatal hernia. 5. Stable left renal atrophy. 6. Stable mild pancreatic atrophy.   Electronically Signed   By: Evangeline Dakin M.D.   On: 10/02/2014 18:41   Dg Foot Complete Right  10/03/2014   CLINICAL DATA:  Erythema involving the right foot with deformity of the 3rd toe. No known injury.  EXAM: RIGHT FOOT COMPLETE - 3+ VIEW  COMPARISON:  09/12/2007.  FINDINGS: Osseous demineralization. No evidence of acute fracture or dislocation. Severe hallux valgus deformity, not significantly changed since 2009. Hammertoe deformities, progressive since the prior examination. Narrowing of IP joints of multiple toes. Narrowed joint spaces in the midfoot.  IMPRESSION: No acute osseous  abnormality. Osseous demineralization. Mild osteoarthritis. Hammertoe deformities. Severe hallux valgus.   Electronically Signed   By: Evangeline Dakin M.D.   On: 10/03/2014 16:09    Microbiology: Recent Results (from the past 240 hour(s))  Urine culture     Status: None   Collection Time: 10/02/14  3:22 PM  Result Value Ref Range Status   Specimen Description URINE, CATHETERIZED  Final   Special Requests Immunocompromised  Final   Colony Count   Final    50,000 COLONIES/ML Performed at [facility]    Culture   Final    Multiple bacterial morphotypes present, none predominant. Suggest appropriate recollection if clinically indicated. Performed at [facility]    Report Status 09/17/2014 FINAL  Final  Clostridium Difficile by PCR     Status: Abnormal   Collection Time: 10/03/14 12:45 AM  Result Value Ref Range Status   C difficile by pcr POSITIVE (A) NEGATIVE Final    Comment: CRITICAL RESULT CALLED TO, READ BACK BY AND VERIFIED WITH:  THOMAS,C @ 0205 ON 10/03/14  BY WOODIE,J   MRSA PCR Screening     Status: None   Collection Time: 10/03/14 12:50 AM  Result Value Ref Range Status   MRSA by PCR NEGATIVE NEGATIVE Final    Comment:        The GeneXpert MRSA Assay (FDA approved for NASAL specimens only), is one component of a comprehensive MRSA colonization surveillance program. It is not intended to diagnose MRSA infection nor to guide or monitor treatment for MRSA infections.      Labs: Basic Metabolic Panel:  Recent Labs Lab 10/02/14 1444 10/03/14 0610  NA 140 138  K 4.6 4.1  CL 105 108  CO2 27 23  GLUCOSE 141* 71  BUN 34* 25*  CREATININE 1.51* 1.14*  CALCIUM 9.4 8.3*   Liver Function Tests:  Recent Labs Lab 10/02/14 1444  AST 26  ALT 13  ALKPHOS 134*  BILITOT 0.8  PROT 6.9  ALBUMIN 3.0*    Recent Labs Lab 10/02/14 1444  LIPASE 22   No results for input(s): AMMONIA in the last 168 hours. CBC:  Recent Labs Lab 10/02/14 1444 10/03/14 0610 10/07/2014 0749  WBC 8.1 10.6* 7.5  NEUTROABS 6.1  --   --   HGB 12.0 11.2* 10.3*  HCT 37.7 34.3* 31.4*  MCV 94.5 92.5 92.1  PLT 159 162 140*   Cardiac Enzymes: No results for input(s): CKTOTAL, CKMB, CKMBINDEX, TROPONINI in the last 168 hours. BNP: BNP (last 3 results) No results for input(s): BNP in the last 8760 hours.  ProBNP (last 3 results) No results for input(s): PROBNP in the last 8760 hours.  CBG: No results for input(s): GLUCAP in the last 168 hours.   SignedNATACHA, JEPSEN  [facility] 10/02/2014, 2:02 PM

## 2014-10-05 NOTE — Care Management Utilization Note (Signed)
UR completed 

## 2014-10-06 ENCOUNTER — Encounter: Payer: Self-pay | Admitting: Internal Medicine

## 2014-10-06 ENCOUNTER — Non-Acute Institutional Stay (SKILLED_NURSING_FACILITY): Payer: Medicare Other | Admitting: Internal Medicine

## 2014-10-06 DIAGNOSIS — R109 Unspecified abdominal pain: Secondary | ICD-10-CM | POA: Diagnosis not present

## 2014-10-06 DIAGNOSIS — I714 Abdominal aortic aneurysm, without rupture, unspecified: Secondary | ICD-10-CM

## 2014-10-06 DIAGNOSIS — A0472 Enterocolitis due to Clostridium difficile, not specified as recurrent: Secondary | ICD-10-CM

## 2014-10-06 DIAGNOSIS — A047 Enterocolitis due to Clostridium difficile: Secondary | ICD-10-CM | POA: Diagnosis not present

## 2014-10-06 NOTE — Progress Notes (Signed)
Patient ID: [name], female   DOB: Oct 01, 1926, 81 y.o.   MRN: [id number] Facility; [facility] Chief complaint readmission to the facility post stay at [facility] from 4/23 through 4/25 History; this is a patient who was admitted to hospital with abdominal pain, decreased responsiveness and apparent hypotension when EMS arrived the. She was found to have a UTI and was given Rocephin although I can't see that her urine culture was positive. She did have a CT scan of the abdomen that showed enlarging of an already very large infrarenal abdominal aortic aneurysm. [See report below]. Patient apparently refused to take by mouth medications. She apparently had diarrhea in the hospital although I cannot find that she had actually had diarrhea here she had a positive C. difficile stool of in and I was called to to discuss this. As far as I can see in her record she was treated for pseudomembranous colitis last in March 2015 therefore this was not a colonizer. She was started on vancomycin every 6 for 14 days.  She is back in the facility apparently has not been having diarrhea.  Past Medical History  Diagnosis Date  . Hypertension   . Memory loss   . Anemia   . Chronic back pain   . Chronic kidney disease   . Anxiety   . Panic attack   . Chronic respiratory failure 04/30/2012  . COPD (chronic obstructive pulmonary disease)   . AAA (abdominal aortic aneurysm)   . Cancer     skin Left wrist BCC  . DVT (deep venous thrombosis)   . Aortic aneurysm, abdominal   . Back injury     has had broken back x 2  . Knee problem     Right knee pain  . Arm fracture, right   . Clostridium difficile enteritis 02/2013    treated   . PMB (postmenopausal bleeding) 03/12/2013  . Depression   . Dementia   . Fall at nursing home Aug. 2015    [facility]   Past Surgical History  Procedure Laterality Date  . Hemorrhoid surgery    . Hip arthroplasty Right 01/08/2013    Procedure: ARTHROPLASTY  BIPOLAR HIP;  Surgeon: Nita Sells, MD;  Location: [facility];  Service: Orthopedics;  Laterality: Right;  . Fracture surgery Right January 08, 2013    Hip   Current Outpatient Prescriptions on File Prior to Visit  Medication Sig Dispense Refill  . acetaminophen (TYLENOL) 500 MG tablet Take 500 mg by mouth every 6 (six) hours as needed for pain.    Roseanne Kaufman Peru-Castor Oil (VENELEX) OINT Apply topically 3 (three) times daily as needed.    . budesonide (PULMICORT) 0.25 MG/2ML nebulizer solution Take 2 mLs (0.25 mg total) by nebulization 2 (two) times daily. 60 mL 12  . cholecalciferol (VITAMIN D) 1000 UNITS tablet Take 1,000 Units by mouth daily.    . cyanocobalamin (,VITAMIN B-12,) 1000 MCG/ML injection Inject 1,000 mcg into the muscle every 30 (thirty) days.    Marland Kitchen dextromethorphan-guaiFENesin (ROBITUSSIN-DM) 10-100 MG/5ML liquid Take 10 mLs by mouth every 4 (four) hours as needed for cough.    . donepezil (ARICEPT) 10 MG tablet Take 10 mg by mouth at bedtime.    . feeding supplement (ENSURE COMPLETE) LIQD Take 237 mLs by mouth 2 (two) times daily between meals.    . folic acid (FOLVITE) 1 MG tablet Take 1 mg by mouth daily.    Marland Kitchen lamoTRIgine (LAMICTAL) 100 MG tablet Take  100 mg by mouth daily.    . magnesium hydroxide (MILK OF MAGNESIA) 400 MG/5ML suspension Take by mouth daily as needed for mild constipation.    . memantine (NAMENDA) 10 MG tablet Take 10 mg by mouth 2 (two) times daily.    . Multiple Vitamin (MULTIVITAMIN WITH MINERALS) TABS Take 1 tablet by mouth daily.    Marland Kitchen omeprazole (PRILOSEC) 20 MG capsule Take 20 mg by mouth 2 (two) times daily before a meal. For GERD    . potassium chloride (K-DUR,KLOR-CON) 10 MEQ tablet Take 1 tablet (10 mEq total) by mouth daily.    . QUEtiapine (SEROQUEL) 25 MG tablet Take 25 mg by mouth daily.     . QUEtiapine (SEROQUEL) 50 MG tablet Take 50 mg by mouth at bedtime.    . traMADol (ULTRAM) 50 MG tablet Take one tablet by mouth every 6 hours as  needed for pain 120 tablet 5  . vancomycin (VANCOCIN) 50 mg/mL oral solution Take 2.5 mLs (125 mg total) by mouth every 6 (six) hours.      Social; the patient has recently been made a DO NOT RESUSCITATE apparently only during this hospitalization  indicated that her mother is deceased. She indicated that her father is deceased. She indicated that her maternal grandmother is deceased. She indicated that her maternal grandfather is deceased. She indicated that her paternal grandmother is deceased. She indicated that her paternal grandfather is deceased.   Review of systems; This is not possible from the patient however her nursing does not report diarrhea here. Her intake is spotty according to the staff she has been gradually losing weight over time. Apparently her daughter once to have her seen by gastroenterology I'm not really sure what the issue here is.  Physical examination Gen. very frail woman with severe dementia. Vitals blood pressure 932 systolic O2 sat 67% on room air respirations 20 and unlabored Respiratory fairly clear entry bilaterally Cardiac she does not appear to be dehydrated the. Otherwise heart sounds are normal no murmurs. Abdomen; bowel sounds are positive nontender. Signs of weight loss. Easily palpable abdominal aortic aneurysm. GU bladder is not distended there is no costovertebral angle tenderness. Skin some mild erythema over the coccyx however no open areas are seen.  Impression/plan #1 abdominal pain and the presence of hypotension. She was found to have pseudomembranous colitis in the hospital and is on treatment with vancomycin. Also an enlarging/very large abdominal aortic aneurysm. She is not a operative candidate and apparently this is already been deemed isn't operable. See no good reason for a GI consult at this point. #2 severe dementia on Aricept Lamictal and Seroquel. I am uncertain about the benefit of the Seroquel here. Would also wonder about the  benefit of Aricept. #3 very large abdominal aortic aneurysm for which she is not a surgical candidate. #4 COPD this appears to be stable her sats are greater than 92% on room air #5 stage III chronic renal failure this is not unstable.  I will repeat her lab work next week. I have continued her on vancomycin. It seems unlikely that she actually had a UTI. There was some suggestion of a rectosigmoid feculent faction on CT scan of the abdomen although I see no evidence of that currently        CLINICAL DATA:  81 year old with dementia and known abdominal aortic aneurysm, presenting with acute onset of abdominal pain. Current history of hypertension, COPD and chronic kidney disease.   EXAM: CT ABDOMEN AND PELVIS  WITHOUT CONTRAST   TECHNIQUE: Multidetector CT imaging of the abdomen and pelvis was performed following the standard protocol without IV contrast. Oral contrast was administered.   COMPARISON:  CTA abdomen and pelvis 02/24/2014, 11/26/2012.   FINDINGS: Infrarenal abdominal aortic aneurysm with maximum axial measurements of 7.0 x 7.2 cm (previously 6.6 x 6.9 cm on the 02/24/2014 CT). No evidence of retroperitoneal hemorrhage or hematoma to suggest acute rupture. The aneurysm extends to just above the aortic bifurcation.   Stable chronic elevation of the right hemidiaphragm. Normal unenhanced appearance of the liver, spleen, adrenal glands, and gallbladder. No biliary ductal dilation. Pancreatic atrophy as noted previously.   Atrophic left kidney with cortical cysts arising from the upper pole and the mid kidney, unchanged. Compensatory hypertrophy of the right kidney with a simple cyst arising from the lower pole, unchanged. No visible lymphadenopathy.   Very large hiatal hernia, unchanged. Normal-appearing small bowel. Large rectosigmoid stool burden. Remainder of the colon relatively decompressed and unremarkable. Normal appendix in the right upper pelvis. No ascites.    Metallic beam hardening streak artifact from the right hip prosthesis obscures the lower pelvis. Visualize urinary bladder unremarkable. Uterus atrophic consistent with age. No visible adnexal masses.   Bone window images demonstrate anatomic alignment of the left hip prosthesis. Numerous compression fracture crash set numerous osteoporotic compression fractures are present including T11, T12, L1, L2 and L4, similar to the most recent prior examination. Visualized lung bases clear apart from chronic scarring in the lower lobes adjacent to the large hiatal hernia.   IMPRESSION: 1. Slight interval increase in size of the infrarenal abdominal aortic aneurysm since the most recent prior examination in September, 2015. Current maximum axial measurements are 7.0 x 7.2 cm (previously 6.6 x 6.9 cm). 2. No evidence of retroperitoneal hemorrhage to suggest aneurysm rupture. 3. Possible rectosigmoid fecal impaction. 4. Stable large hiatal hernia. 5. Stable left renal atrophy. 6. Stable mild pancreatic atrophy.     Electronically Signed   By: Evangeline Dakin M.D.   On: 10/02/2014 18:41    Results for [name], [name] (MRN [id number]) as of 10/06/2014 10:30  Ref. Range 08/19/2014 07:10 09/10/2014 07:05 10/02/2014 14:44 10/03/2014 06:10 09/21/2014 07:49  Sodium Latest Ref Range: 135-145 mmol/L 141 139 140 138   Potassium Latest Ref Range: 3.5-5.1 mmol/L 4.0 4.0 4.6 4.1   Chloride Latest Ref Range: 96-112 mmol/L 108 107 105 108   CO2 Latest Ref Range: 19-32 mmol/L _0 Mean Plasma Glucose Latest Units: mg/dL 100      BUN Latest Ref Range: 6-23 mg/dL 29 (H) 25 (H) 34 (H) 25 (H)   Creatinine Latest Ref Range: 0.50-1.10 mg/dL 1.21 (H) 1.02 1.51 (H) 1.14 (H)   Calcium Latest Ref Range: 8.4-10.5 mg/dL 8.9 9.0 9.4 8.3 (L)   EGFR (Non-African Amer.) Latest Ref Range: >90 mL/min 40 (L) 49 (L) 30 (L) 43 (L)   EGFR (African American) Latest Ref Range: >90 mL/min 46 (L) 56 (L) 35 (L) 49 (L)   Glucose  Latest Ref Range: 70-99 mg/dL 69 (L) 72 141 (H) 71   Anion gap Latest Ref Range: 5-_1 Alkaline Phosphatase Latest Ref Range: 39-117 U/L 136 (H) 135 (H) 134 (H)    Albumin Latest Ref Range: 3.5-5.2 g/dL 2.6 (L) 2.7 (L) 3.0 (L)    Lipase Latest Ref Range: 11-59 U/L   22    AST Latest Ref Range: 0-37 U/L 19 23 26  ALT Latest Ref Range: 0-35 U/L _0 Total Protein Latest Ref Range: 6.0-8.3 g/dL 6.1 6.3 6.9    Total Bilirubin Latest Ref Range: 0.3-1.2 mg/dL 0.9 0.8 0.8    Iron Latest Ref Range: 42-145 ug/dL    42   UIBC Latest Ref Range: 125-400 ug/dL    112 (L)   TIBC Latest Ref Range: 250-470 ug/dL    154 (L)   Saturation Ratios Latest Ref Range: 20-55 %    27   Ferritin Latest Ref Range: 10-291 ng/mL    400 (H)   Folate Latest Units: ng/mL    >20.0   Vitamin B-12 Latest Ref Range: 211-911 pg/mL    >2000 (H)   WBC Latest Ref Range: 4.0-10.5 K/uL 7.5 7.9 8.1 10.6 (H) 7.5  RBC Latest Ref Range: 3.87-5.11 MIL/uL 3.81 (L) 4.07 3.99 3.71 (L) 3.41 (L)  Hemoglobin Latest Ref Range: 12.0-15.0 g/dL 11.4 (L) 11.9 (L) 12.0 11.2 (L) 10.3 (L)  HCT Latest Ref Range: 36.0-46.0 % 35.5 (L) 37.6 37.7 34.3 (L) 31.4 (L)  MCV Latest Ref Range: 78.0-100.0 fL 93.2 92.4 94.5 92.5 92.1  MCH Latest Ref Range: 26.0-34.0 pg 29.9 29.2 30.1 30.2 30.2  MCHC Latest Ref Range: 30.0-36.0 g/dL 32.1 31.6 31.8 32.7 32.8  RDW Latest Ref Range: 11.5-15.5 % 15.6 (H) 15.4 15.5 15.6 (H) 15.5  Platelets Latest Ref Range: 150-400 K/uL 147 (L) 180 159 162 140 (L)  Neutrophils Latest Ref Range: 43-77 % 67  75    Lymphocytes Latest Ref Range: 12-46 % 20  17    Monocytes Relative Latest Ref Range: 3-12 % 11  8    Eosinophil Latest Ref Range: 0-5 % 1  0    Basophil Latest Ref Range: 0-1 % 1  0    NEUT# Latest Ref Range: 1.7-7.7 K/uL 5.0  6.1    Lymphocyte # Latest Ref Range: 0.7-4.0 K/uL 1.5  1.4    Monocyte # Latest Ref Range: 0.1-1.0 K/uL 0.9  0.6    Eosinophils Absolute Latest Ref Range: 0.0-0.7 K/uL 0.1  0.0     Basophils Absolute Latest Ref Range: 0.0-0.1 K/uL 0.0  0.0    RBC. Latest Ref Range: 3.87-5.11 MIL/uL    3.71 (L)   Retic Ct Pct Latest Ref Range: 0.4-3.1 %    1.6   Retic Count, Manual Latest Ref Range: 19.0-186.0 K/uL    59.4   Hemoglobin A1C Latest Ref Range: 4.8-5.6 % 5.1

## 2014-10-06 NOTE — Progress Notes (Signed)
Patient ID: [name], female   DOB: [birth year], 81 y.o.   MRN: [id number]       Facility; [facility]  This is an acute visitt Level of care skilled  .  Chief complaint;--acute visit secondary to altered mental status with possible hypotension-hypoxia      History of present illness  --; this is an 81 year old woman who came to [place] after suffering a right hip fracture she underwent a right hip hemiarthroplasty. She has a history of COPD. Other issues include dementia, tricuspid regurgitation, and chronic renal failure stage II .  Also has a history of recurrent C. difficile although this has been stable for some time  However her dementia appears to be progressing fairly significantly-l this has been her major issue.  She has been eating somewhat poorly more recently as well a.  Apparently this afternoon patient had a change in status apparently she ate her breakfast well however shortly after lunch nursing staff and daughter noted that she had a change she was more lethargic and less responsive-it was very difficult to get her vital signs I could not really auscultate a blood pressure or a pulse-machine did say 81/26 although I question how accurate this is-O2 stats also are difficult to obtain appear to be in the 70s to 80s.  Her pulse also appeared to be somewhat variable  although this was very difficult to auscultate or palpate  Nonetheless through all all this she was intermittently responsive and occasionally mildy agitated in between periods of somnolence   I did review her medications and note she has been on Lamictal and Seroquel but this is not really new I do not see any real recent medication changes---   .    -.  Family medical social history reviewed per history and physical on 01/12/2013 --and previous progress notes .  Medications have been reviewed per [facility] they include Tylenol  K53 folic acid Lamictal  Namenda potassium and Seroquel twice a day.  .   Review of systems--essentially impossible secondary to lethargy complicated with severe dementia She does not appear to be in pain or shortness of breath;         Physical exam;  Vital signs As noted above this was very challenging to obtain - In general this is a frail elderly female intermittently unresponsive-  Her skin is warm and dry there is chronic bruising.  Eyes pupils do appear reactive to light although she tends to hold them shutt quite tightly.  Oropharynx could not be really assess secondary to patient holding her mouth shut.  Chest poor respiratory effort but I could not appreciate any overt congestion.  Heart very difficult to auscultate-there is no lower extremity edema.  Abdomen there is some tenderness to palpation of her abdomen and lower abdominal area bowel sounds appear to be active.  Muscle skeletal she does move all her extremities 4 at times-- it appears she has her baseline strength here. When she does move her extremities  Neurologic she does have severe dementia which complicates things as well as lethargy again she is intermittently responsive moving all extremities and then becomes somnolent.  Psych as noted above.  Labs.  09/10/2014.  WBC 7.9 hemoglobin 11.9 platelets 180.  Sodium 139 potassium 4 BUN 25 creatinine 1.02 CO2 was 25.  Liver function tests within normal limits except albumin of 2.7 and alkaline phosphatase 135.  Assessment and plan.  Altered mental status this appears to be a significant change from her  baseline even earlier today-very difficult to get vital signs auscultate a pulse--despite numerous attempts- she has wildly fluctuating O2 satu rations-her daughter is quite concerned-she is at the patient's side and has witnessed all this and also was able to give a history of her status earlier today.  We will send to the ER for urgent evaluation.  [id number]         .   Marland Kitchen

## 2014-10-10 DEATH — deceased

## 2014-10-11 ENCOUNTER — Encounter (HOSPITAL_COMMUNITY)
Admission: RE | Admit: 2014-10-11 | Discharge: 2014-10-11 | Disposition: A | Discharge status: Home / Self Care | Payer: Medicare Other | Source: Skilled Nursing Facility | Attending: Internal Medicine | Admitting: Internal Medicine

## 2014-10-11 DIAGNOSIS — F39 Unspecified mood [affective] disorder: Secondary | ICD-10-CM | POA: Diagnosis not present

## 2014-10-11 DIAGNOSIS — F0391 Unspecified dementia with behavioral disturbance: Secondary | ICD-10-CM | POA: Insufficient documentation

## 2014-10-11 LAB — COMPREHENSIVE METABOLIC PANEL
ALK PHOS: 120 U/L (ref 38–126)
ALT: 16 U/L (ref 14–54)
ANION GAP: 5 (ref 5–15)
AST: 30 U/L (ref 15–41)
Albumin: 2.4 g/dL — ABNORMAL LOW (ref 3.5–5.0)
BILIRUBIN TOTAL: 0.6 mg/dL (ref 0.3–1.2)
BUN: 14 mg/dL (ref 6–20)
CO2: 29 mmol/L (ref 22–32)
Calcium: 8.8 mg/dL — ABNORMAL LOW (ref 8.9–10.3)
Chloride: 106 mmol/L (ref 101–111)
Creatinine, Ser: 0.85 mg/dL (ref 0.44–1.00)
GFR calc non Af Amer: >60 mL/min (ref >60)
GLUCOSE: 68 mg/dL — AB (ref 70–99)
POTASSIUM: 4.3 mmol/L (ref 3.5–5.1)
Sodium: 140 mmol/L (ref 135–145)
TOTAL PROTEIN: 5.8 g/dL — AB (ref 6.5–8.1)

## 2014-10-11 LAB — CBC
HCT: 35.6 % — ABNORMAL LOW (ref 36.0–46.0)
HEMOGLOBIN: 11.3 g/dL — AB (ref 12.0–15.0)
MCH: 29.6 pg (ref 26.0–34.0)
MCHC: 31.7 g/dL (ref 30.0–36.0)
MCV: 93.2 fL (ref 78.0–100.0)
Platelets: 219 10*3/uL (ref 150–400)
RBC: 3.82 MIL/uL — ABNORMAL LOW (ref 3.87–5.11)
RDW: 15.8 % — AB (ref 11.5–15.5)
WBC: 5.8 10*3/uL (ref 4.0–10.5)

## 2014-10-13 ENCOUNTER — Non-Acute Institutional Stay (SKILLED_NURSING_FACILITY): Payer: Medicare Other | Admitting: Internal Medicine

## 2014-10-13 DIAGNOSIS — I714 Abdominal aortic aneurysm, without rupture, unspecified: Secondary | ICD-10-CM

## 2014-10-13 DIAGNOSIS — F0391 Unspecified dementia with behavioral disturbance: Secondary | ICD-10-CM

## 2014-10-13 DIAGNOSIS — R634 Abnormal weight loss: Secondary | ICD-10-CM | POA: Diagnosis not present

## 2014-10-13 DIAGNOSIS — M79674 Pain in right toe(s): Secondary | ICD-10-CM

## 2014-10-13 DIAGNOSIS — F03918 Unspecified dementia, unspecified severity, with other behavioral disturbance: Secondary | ICD-10-CM

## 2014-10-13 DIAGNOSIS — R5383 Other fatigue: Secondary | ICD-10-CM | POA: Diagnosis not present

## 2014-10-13 DIAGNOSIS — A047 Enterocolitis due to Clostridium difficile: Secondary | ICD-10-CM | POA: Diagnosis not present

## 2014-10-13 DIAGNOSIS — M79676 Pain in unspecified toe(s): Secondary | ICD-10-CM | POA: Insufficient documentation

## 2014-10-13 DIAGNOSIS — A0472 Enterocolitis due to Clostridium difficile, not specified as recurrent: Secondary | ICD-10-CM

## 2014-10-13 NOTE — Progress Notes (Signed)
Patient ID: [name], female   DOB: 11-05-1926, 81 y.o.   MRN: [id number]   This is an acute visit   Level care skilled.  Facility [facility].     Chief complaint --acute visit to address family concerns including lethargy with history of dementia with behavior disturbances-general medical issues  Historyof present illness---; this is a patient who is a long-term resident of this facility--most recently she was admitted to hospital with abdominal pain, decreased responsiveness and apparent hypotension when EMS arrived the. She was found to have a UTI and was given Rocephin a. She did have a CT scan of the abdomen that showed enlarging of an already very large infrarenal abdominal aortic aneurysm. [See report below]. Patient apparently refused to take by mouth medications. She apparently had diarrhea in the hospital although I cannot find that she had actually had diarrhea here she had a positive C. difficile stool . She was started on vancomycin every 6  Hours  for 14 days.  She is back in the facility apparently has not been having diarrhea.  Her family has noted over the course of several weeks she's had some increased lethargy-previous to that she has significant behavioral disturbances-she has been seen by psychiatric nurse practitioner-who has been adjusting her medications currently she is on the Lamictal 100 mg a day and Seroquel twice a day 25 mg earlier in the day 50 mg in the evening.  Her behaviors have stabilized significantly-and according to family she is more  consistently alert the past few days-however they were wondering if possibly some medications can be titrated down slightly.-- will havethe psychiatric nurse practitioner   Contacted for her input I suspect this can be accomplished but will have to be judicious with her history of behavioral disturbances in the past and her family is well aware of this and in agreement.  Patient has had recent weight loss but this  appears actually to have reversed itself weight currently is 103.2 which is up up actually 4 pounds the chart in the past 2 weeks-apparently she is eating better which is encouraging.  Patient does need encouragement to eat and will write an order that this needs to be encouraged strongly during mealtimes.  Patient also has had foot issues in the hospital apparently on the right second toe there was some discomfort aggravated by the length of her toenails-this was addressed somewhat during her hospitalization with trimming-family would like podiatry input as soon as can be arranged and will write for that as well.  On exam I do not see any sign of infection this afternoon.  In regards to GI issues-there is some thought of a GI consult-however since patient would be a poor candidate I suspect for aggressive treatment this was canceled-however discussing this with her family today they would be more comfortable at least getting a GI consult to talk with the physician although they are aware that aggressive measures are probably not a viable option-they would like to at least visit with the GI doctor and certainly will write an order for this as well.  Clinically she appears to be stable today I do note occasional low blood pressures but I got 110/80 manually today it appears it ranges from 373/42 sometimes systolics in the 87G although I suspect some of this is variation because blood pressures are often taken by machine    Past Medical History  Diagnosis Date  . Hypertension   . Memory loss   . Anemia   .  Chronic back pain   . Chronic kidney disease   . Anxiety   . Panic attack   . Chronic respiratory failure 04/30/2012  . COPD (chronic obstructive pulmonary disease)   . AAA (abdominal aortic aneurysm)   . Cancer     skin Left wrist BCC  . DVT (deep venous thrombosis)   . Aortic aneurysm, abdominal   . Back injury     has had broken back x 2  . Knee problem     Right knee pain  . Arm  fracture, right   . Clostridium difficile enteritis 02/2013    treated   . PMB (postmenopausal bleeding) 03/12/2013  . Depression   . Dementia   . Fall at nursing home Aug. 2015    [facility]   Past Surgical History  Procedure Laterality Date  . Hemorrhoid surgery    . Hip arthroplasty Right 01/08/2013    Procedure: ARTHROPLASTY BIPOLAR HIP;  Surgeon: Nita Sells, MD;  Location: [facility];  Service: Orthopedics;  Laterality: Right;  . Fracture surgery Right January 08, 2013    Hip   Current Outpatient Prescriptions on File Prior to Visit  Medication Sig Dispense Refill  . acetaminophen (TYLENOL) 500 MG tablet Take 500 mg by mouth every 6 (six) hours as needed for pain.    Roseanne Kaufman Peru-Castor Oil (VENELEX) OINT Apply topically 3 (three) times daily as needed.    . budesonide (PULMICORT) 0.25 MG/2ML nebulizer solution Take 2 mLs (0.25 mg total) by nebulization 2 (two) times daily. 60 mL 12  . cholecalciferol (VITAMIN D) 1000 UNITS tablet Take 1,000 Units by mouth daily.    . cyanocobalamin (,VITAMIN B-12,) 1000 MCG/ML injection Inject 1,000 mcg into the muscle every 30 (thirty) days.    Marland Kitchen dextromethorphan-guaiFENesin (ROBITUSSIN-DM) 10-100 MG/5ML liquid Take 10 mLs by mouth every 4 (four) hours as needed for cough.    . donepezil (ARICEPT) 10 MG tablet Take 10 mg by mouth at bedtime.    . feeding supplement (ENSURE COMPLETE) LIQD Take 237 mLs by mouth 2 (two) times daily between meals.    . folic acid (FOLVITE) 1 MG tablet Take 1 mg by mouth daily.    Marland Kitchen lamoTRIgine (LAMICTAL) 100 MG tablet Take 100 mg by mouth daily.    . magnesium hydroxide (MILK OF MAGNESIA) 400 MG/5ML suspension Take by mouth daily as needed for mild constipation.    . memantine (NAMENDA) 10 MG tablet Take 10 mg by mouth 2 (two) times daily.    . Multiple Vitamin (MULTIVITAMIN WITH MINERALS) TABS Take 1 tablet by mouth daily.    Marland Kitchen omeprazole (PRILOSEC) 20 MG capsule Take 20 mg by mouth 2 (two) times daily  before a meal. For GERD    . potassium chloride (K-DUR,KLOR-CON) 10 MEQ tablet Take 1 tablet (10 mEq total) by mouth daily.    . QUEtiapine (SEROQUEL) 25 MG tablet Take 25 mg by mouth daily.     . QUEtiapine (SEROQUEL) 50 MG tablet Take 50 mg by mouth at bedtime.    . traMADol (ULTRAM) 50 MG tablet Take one tablet by mouth every 6 hours as needed for pain 120 tablet 5  . vancomycin (VANCOCIN) 50 mg/mL oral solution Take 2.5 mLs (125 mg total) by mouth every 6 (six) hours.      Social; the patient has recently been made a DO NOT RESUSCITATE apparently only during this hospitalization   her mother is deceased. her father is deceased. her maternal grandmother is  deceased. She indicated that her maternal grandfather is deceased. t her paternal grandmother is deceased. her paternal grandfather is deceased.   Review of systems; This is not possible from the patient however her nursing does not report diarrhea here.  Apparently she is eating better the last few days appears to have gained weight which is encouraging-according the family she is alert and responsive today and that's the case by nursing staff as well-she appears relatively at her baseline alert confused not combative.  Physical examination She is afebrile pulse of 82 respirations 18 blood pressure taken manually 110/80 weight is 103.2 Gen. very frail woman with severe dementia. She is resting comfortably she is alert but confused Her skin is warm and dry do not see any increased bruising beyond baseline-.  Eyes she has prescription lenses pupils appear reactive light sclera and conjunctiva clear visual acuity appears to be intact  Oropharynx is clear mucous membranes appear fairly moist  Respiratory fairly clear entry bilaterally Cardiac regular rate and rhythm without murmur gallop or rub-she does not have significant lower extremity edema. Abdomen; bowel sounds are positive nontender. Signs of weight loss.  palpable abdominal  aortic aneurysm. Muscle skeletal general frailty but I do not note any deformity she does move all extremities 4 with baseline strength.  Per review feet do not see any sign of infection or drainage toe nails have been trimmed somewhat there are scaling changes which are not new--  Neurologic is grossly intact --speech is clear-no lateralizing findings  Psych-has severe dementia but is pleasant and cooperative-follows some verbal commands she is able to identify family members by name   Labs.  10/11/2014.  WBC 5.8 hemoglobin 11.3 platelets 219.  Sodium 140 potassium 4.3 BUN 14 creatinine 0.85 CO2 of 29.  Liver function tests within normal limits except albumin of 2.4  .  Impression/plan  #1 abdominal pain and the presence of hypotension. She was found to have pseudomembranous colitis in the hospital and is on treatment with vancomycin. Also an enlarging/very large abdominal aortic aneurysm. She is not a operative candidate and apparently this is already been deemed isn't operable. --However per discussion with family today they really would like a GI consult just to talk to the physician and I will order this they are aware that she is probably not a aggressive candidate   #2 severe dementia on Aricept Lamictal and Seroquel. There are concerns about lethargy although apparently this has improved recently-she is followed closely by the psychiatric nurse practitioner I did discuss this with family-would be hesitant to change Lamictal or Seroquel doses without psychiatric nurse practitioner -- will have her contacted for her recommendations again I suspect any changes will be fairly judicious -since she appears to be doing relatively well  #3 very large abdominal aortic aneurysm for which she is not a surgical candidate . #4 COPD this appears to be stable  Number 5-foot care issues-family would like a podiatry consult they do have a podiatrist comes the facility although this may be a  while-will write an order for  Outside podiatry consult   #'6 weight loss-this actually appears to have reversed itself she is eating better-will write an order to monitor eating and encourage assistance-she is a dependent fever.  Also will update laboratory values next week it appears the ones done earlier this week were fairly baseline and unremarkable which is encouraging.  Clinically she appears to be relatively stable with again the above-stated concerns.  [id number] note greater than 40 minutes  spent assessing patient-reviewing her chart-discussing her status with nursing staff- discussing patient's status with her family including her son and daughter  at the patient's side--and coordinating plan of care.--Of note greater than 50% of time spent coordinating plan of care with extensive family input  .           CLINICAL DATA:  81 year old with dementia and known abdominal aortic aneurysm, presenting with acute onset of abdominal pain. Current history of hypertension, COPD and chronic kidney disease.   EXAM: CT ABDOMEN AND PELVIS WITHOUT CONTRAST   TECHNIQUE: Multidetector CT imaging of the abdomen and pelvis was performed following the standard protocol without IV contrast. Oral contrast was administered.   COMPARISON:  CTA abdomen and pelvis 02/24/2014, 11/26/2012.   FINDINGS: Infrarenal abdominal aortic aneurysm with maximum axial measurements of 7.0 x 7.2 cm (previously 6.6 x 6.9 cm on the 02/24/2014 CT). No evidence of retroperitoneal hemorrhage or hematoma to suggest acute rupture. The aneurysm extends to just above the aortic bifurcation.   Stable chronic elevation of the right hemidiaphragm. Normal unenhanced appearance of the liver, spleen, adrenal glands, and gallbladder. No biliary ductal dilation. Pancreatic atrophy as noted previously.   Atrophic left kidney with cortical cysts arising from the upper pole and the mid kidney, unchanged. Compensatory  hypertrophy of the right kidney with a simple cyst arising from the lower pole, unchanged. No visible lymphadenopathy.   Very large hiatal hernia, unchanged. Normal-appearing small bowel. Large rectosigmoid stool burden. Remainder of the colon relatively decompressed and unremarkable. Normal appendix in the right upper pelvis. No ascites.   Metallic beam hardening streak artifact from the right hip prosthesis obscures the lower pelvis. Visualize urinary bladder unremarkable. Uterus atrophic consistent with age. No visible adnexal masses.   Bone window images demonstrate anatomic alignment of the left hip prosthesis. Numerous compression fracture crash set numerous osteoporotic compression fractures are present including T11, T12, L1, L2 and L4, similar to the most recent prior examination. Visualized lung bases clear apart from chronic scarring in the lower lobes adjacent to the large hiatal hernia.   IMPRESSION: 1. Slight interval increase in size of the infrarenal abdominal aortic aneurysm since the most recent prior examination in September, 2015. Current maximum axial measurements are 7.0 x 7.2 cm (previously 6.6 x 6.9 cm). 2. No evidence of retroperitoneal hemorrhage to suggest aneurysm rupture. 3. Possible rectosigmoid fecal impaction. 4. Stable large hiatal hernia. 5. Stable left renal atrophy. 6. Stable mild pancreatic atrophy.     Electronically Signed   By: Evangeline Dakin M.D.   On: 10/02/2014 18:41    Results for TANNYA, GONET (MRN [id number]) as of 10/06/2014 10:30  Ref. Range 08/19/2014 07:10 09/10/2014 07:05 10/02/2014 14:44 10/03/2014 06:10 09/18/2014 07:49  Sodium Latest Ref Range: 135-145 mmol/L 141 139 140 138   Potassium Latest Ref Range: 3.5-5.1 mmol/L 4.0 4.0 4.6 4.1   Chloride Latest Ref Range: 96-112 mmol/L 108 107 105 108   CO2 Latest Ref Range: 19-32 mmol/L $RemoveBefo'27 25 27 23   'KmhJtvfcMQw$ Mean Plasma Glucose Latest Units: mg/dL 100      BUN Latest Ref Range: 6-23 mg/dL  29 (H) 25 (H) 34 (H) 25 (H)   Creatinine Latest Ref Range: 0.50-1.10 mg/dL 1.21 (H) 1.02 1.51 (H) 1.14 (H)   Calcium Latest Ref Range: 8.4-10.5 mg/dL 8.9 9.0 9.4 8.3 (L)   EGFR (Non-African Amer.) Latest Ref Range: >90 mL/min 40 (L) 49 (L) 30 (L) 43 (L)   EGFR (African American) Latest Ref Range: >90 mL/min  46 (L) 56 (L) 35 (L) 49 (L)   Glucose Latest Ref Range: 70-99 mg/dL 69 (L) 72 141 (H) 71   Anion gap Latest Ref Range: 5-$RemoveBefo'15  6 7 8 7   'kFdXjTJWJAc$ Alkaline Phosphatase Latest Ref Range: 39-117 U/L 136 (H) 135 (H) 134 (H)    Albumin Latest Ref Range: 3.5-5.2 g/dL 2.6 (L) 2.7 (L) 3.0 (L)    Lipase Latest Ref Range: 11-59 U/L   22    AST Latest Ref Range: 0-37 U/L $Remove'19 23 26    'eirDYsT$ ALT Latest Ref Range: 0-35 U/L $Remove'11 12 13    'MMtlqdN$ Total Protein Latest Ref Range: 6.0-8.3 g/dL 6.1 6.3 6.9    Total Bilirubin Latest Ref Range: 0.3-1.2 mg/dL 0.9 0.8 0.8    Iron Latest Ref Range: 42-145 ug/dL    42   UIBC Latest Ref Range: 125-400 ug/dL    112 (L)   TIBC Latest Ref Range: 250-470 ug/dL    154 (L)   Saturation Ratios Latest Ref Range: 20-55 %    27   Ferritin Latest Ref Range: 10-291 ng/mL    400 (H)   Folate Latest Units: ng/mL    >20.0   Vitamin B-12 Latest Ref Range: 211-911 pg/mL    >2000 (H)   WBC Latest Ref Range: 4.0-10.5 K/uL 7.5 7.9 8.1 10.6 (H) 7.5  RBC Latest Ref Range: 3.87-5.11 MIL/uL 3.81 (L) 4.07 3.99 3.71 (L) 3.41 (L)  Hemoglobin Latest Ref Range: 12.0-15.0 g/dL 11.4 (L) 11.9 (L) 12.0 11.2 (L) 10.3 (L)  HCT Latest Ref Range: 36.0-46.0 % 35.5 (L) 37.6 37.7 34.3 (L) 31.4 (L)  MCV Latest Ref Range: 78.0-100.0 fL 93.2 92.4 94.5 92.5 92.1  MCH Latest Ref Range: 26.0-34.0 pg 29.9 29.2 30.1 30.2 30.2  MCHC Latest Ref Range: 30.0-36.0 g/dL 32.1 31.6 31.8 32.7 32.8  RDW Latest Ref Range: 11.5-15.5 % 15.6 (H) 15.4 15.5 15.6 (H) 15.5  Platelets Latest Ref Range: 150-400 K/uL 147 (L) 180 159 162 140 (L)  Neutrophils Latest Ref Range: 43-77 % 67  75    Lymphocytes Latest Ref Range: 12-46 % 20  17    Monocytes  Relative Latest Ref Range: 3-12 % 11  8    Eosinophil Latest Ref Range: 0-5 % 1  0    Basophil Latest Ref Range: 0-1 % 1  0    NEUT# Latest Ref Range: 1.7-7.7 K/uL 5.0  6.1    Lymphocyte # Latest Ref Range: 0.7-4.0 K/uL 1.5  1.4    Monocyte # Latest Ref Range: 0.1-1.0 K/uL 0.9  0.6    Eosinophils Absolute Latest Ref Range: 0.0-0.7 K/uL 0.1  0.0    Basophils Absolute Latest Ref Range: 0.0-0.1 K/uL 0.0  0.0    RBC. Latest Ref Range: 3.87-5.11 MIL/uL    3.71 (L)   Retic Ct Pct Latest Ref Range: 0.4-3.1 %    1.6   Retic Count, Manual Latest Ref Range: 19.0-186.0 K/uL    59.4   Hemoglobin A1C Latest Ref Range: 4.8-5.6 % 5.1

## 2014-10-18 ENCOUNTER — Encounter (HOSPITAL_COMMUNITY)
Admission: RE | Admit: 2014-10-18 | Discharge: 2014-10-18 | Disposition: A | Discharge status: Home / Self Care | Payer: Medicare Other | Source: Skilled Nursing Facility | Attending: Internal Medicine | Admitting: Internal Medicine

## 2014-10-18 DIAGNOSIS — F39 Unspecified mood [affective] disorder: Secondary | ICD-10-CM | POA: Diagnosis not present

## 2014-10-18 LAB — BASIC METABOLIC PANEL
Anion gap: 7 (ref 5–15)
BUN: 23 mg/dL — AB (ref 6–20)
CHLORIDE: 106 mmol/L (ref 101–111)
CO2: 28 mmol/L (ref 22–32)
Calcium: 9.2 mg/dL (ref 8.9–10.3)
Creatinine, Ser: 1.15 mg/dL — ABNORMAL HIGH (ref 0.44–1.00)
GFR calc Af Amer: 49 mL/min — ABNORMAL LOW (ref >60)
GFR calc non Af Amer: 42 mL/min — ABNORMAL LOW (ref >60)
GLUCOSE: 68 mg/dL — AB (ref 70–99)
POTASSIUM: 4.3 mmol/L (ref 3.5–5.1)
SODIUM: 141 mmol/L (ref 135–145)

## 2014-10-18 LAB — CBC WITH DIFFERENTIAL/PLATELET
Basophils Absolute: 0.1 10*3/uL (ref 0.0–0.1)
Basophils Relative: 1 % (ref 0–1)
EOS ABS: 0.1 10*3/uL (ref 0.0–0.7)
Eosinophils Relative: 1 % (ref 0–5)
HCT: 38.3 % (ref 36.0–46.0)
HEMOGLOBIN: 12 g/dL (ref 12.0–15.0)
LYMPHS ABS: 1.3 10*3/uL (ref 0.7–4.0)
Lymphocytes Relative: 16 % (ref 12–46)
MCH: 29.5 pg (ref 26.0–34.0)
MCHC: 31.3 g/dL (ref 30.0–36.0)
MCV: 94.1 fL (ref 78.0–100.0)
MONO ABS: 0.7 10*3/uL (ref 0.1–1.0)
MONOS PCT: 9 % (ref 3–12)
Neutro Abs: 5.8 10*3/uL (ref 1.7–7.7)
Neutrophils Relative %: 73 % (ref 43–77)
Platelets: 210 10*3/uL (ref 150–400)
RBC: 4.07 MIL/uL (ref 3.87–5.11)
RDW: 16.1 % — ABNORMAL HIGH (ref 11.5–15.5)
WBC: 7.9 10*3/uL (ref 4.0–10.5)

## 2014-10-19 ENCOUNTER — Encounter: Payer: Self-pay | Admitting: Vascular Surgery

## 2014-10-20 ENCOUNTER — Ambulatory Visit (INDEPENDENT_AMBULATORY_CARE_PROVIDER_SITE_OTHER): Payer: Medicare Other | Admitting: Vascular Surgery

## 2014-10-20 ENCOUNTER — Ambulatory Visit (HOSPITAL_COMMUNITY)
Admit: 2014-10-20 | Discharge: 2014-10-20 | Disposition: A | Discharge status: Home / Self Care | Payer: Medicare Other | Source: Ambulatory Visit | Attending: Vascular Surgery | Admitting: Vascular Surgery

## 2014-10-20 ENCOUNTER — Encounter: Payer: Self-pay | Admitting: Vascular Surgery

## 2014-10-20 VITALS — BP 145/71 | HR 71 | Ht 66.0 in | Wt 99.0 lb

## 2014-10-20 DIAGNOSIS — I714 Abdominal aortic aneurysm, without rupture, unspecified: Secondary | ICD-10-CM

## 2014-10-20 NOTE — Progress Notes (Signed)
[facility]  Patient name: [name] MRN: [id number] DOB: 09/08/1926 Sex: female  REASON FOR VISIT: Follow up of abdominal aortic aneurysm.  HPI: [name] is a 81 y.o. female who I last saw in September 2015. She has a history of a juxtarenal aneurysm. She is not a candidate for EVAR. The ascending aorta measures 3.6 cm in maximum diameter at that time. Her juxtarenal aneurysm measured 6.7 cm in maximum diameter. She has multiple comorbidities including COPD and chronic kidney disease. She also has underlying dementia. She resides in a nursing home. I had a lengthy discussion at the last visit and the daughter agree that she was not a candidate for open repair. She comes in for routine follow up visit.  She has been in the hospital recently approximate 2 weeks ago at [facility]. This was for abdominal pain. She has had some persistent abdominal pain but is having problems with constipation.  She had a study during that admission which showed no evidence of aneurysm rupture although the aneurysm was larger.  Past Medical History  Diagnosis Date  . Hypertension   . Memory loss   . Anemia   . Chronic back pain   . Chronic kidney disease   . Anxiety   . Panic attack   . Chronic respiratory failure 04/30/2012  . COPD (chronic obstructive pulmonary disease)   . AAA (abdominal aortic aneurysm)   . Cancer     skin Left wrist BCC  . DVT (deep venous thrombosis)   . Aortic aneurysm, abdominal   . Back injury     has had broken back x 2  . Knee problem     Right knee pain  . Arm fracture, right   . Clostridium difficile enteritis 02/2013    treated   . PMB (postmenopausal bleeding) 03/12/2013  . Depression   . Dementia   . Fall at nursing home Aug. 2015    [facility]   Family History  Problem Relation Age of Onset  . Other Mother   . AAA (abdominal aortic aneurysm) Mother   . Heart attack Mother   . Bladder Cancer Sister   .  Breast cancer Sister   . Lung cancer Sister   . Colon cancer Brother   . Colon cancer Brother   . Hyperlipidemia Father   . Hypertension Father   . Heart disease Father     Heart Disease before age 68   SOCIAL HISTORY: History  Substance Use Topics  . Smoking status: Former Smoker -- 1.00 packs/day for 63 years    Types: Cigarettes    Quit date: 04/25/2012  . Smokeless tobacco: Never Used     Comment: PATIENT QUIT AFTER FALL ON 04/2012  . Alcohol Use: No   Allergies  Allergen Reactions  . Biaxin [Clarithromycin]   . Erythromycin   . Imodium A-D [Loperamide Hcl] Other (See Comments)    Unsure   . Keflex [Cephalexin]   . Macrodantin [Nitrofurantoin Macrocrystal]   . Penicillins   . Sulfa Antibiotics    No current outpatient prescriptions on file.   No current facility-administered medications for this visit.   REVIEW OF SYSTEMS: Valu.Nieves ] denotes positive finding; [  ] denotes negative finding  CARDIOVASCULAR:  [ ]  chest pain   [ ]  chest pressure   [ ]  palpitations   [ ]  orthopnea   [ ]  dyspnea on exertion   [ ]  claudication   [ ]   rest pain   [ ]  DVT   [ ]  phlebitis PULMONARY:   [ ]  productive cough   [ ]  asthma   [ ]  wheezing NEUROLOGIC:   Valu.Nieves ] weakness  [ ]  paresthesias  [ ]  aphasia  [ ]  amaurosis  [ ]  dizziness HEMATOLOGIC:   [ ]  bleeding problems   [ ]  clotting disorders MUSCULOSKELETAL:  Valu.Nieves ] joint pain   [ ]  joint swelling [ ]  leg swelling GASTROINTESTINAL: [ ]   blood in stool  [ ]   hematemesis GENITOURINARY:  [ ]   dysuria  [ ]   hematuria PSYCHIATRIC:  [ ]  history of major depression INTEGUMENTARY:  [ ]  rashes  [ ]  ulcers CONSTITUTIONAL:  [ ]  fever   [ ]  chills  PHYSICAL EXAM: Filed Vitals:   10/20/14 1050  BP: 145/71  Pulse: 71  Height: 5\' 6"  (1.676 m)  Weight: 99 lb (44.906 kg)  SpO2: 100%   GENERAL: The patient is a well-nourished female, in no acute distress. The vital signs are documented above. CARDIOVASCULAR: There is a regular rate and rhythm. Do not  detect carotid bruits. Both feet are warm and well-perfused. She has no ischemic ulcers. PULMONARY: There is good air exchange bilaterally without wheezing or rales. ABDOMEN: Soft and non-tender with normal pitched bowel sounds. Her aneurysm is nontender. MUSCULOSKELETAL: There are no major deformities or cyanosis. NEUROLOGIC: No focal weakness or paresthesias are detected. SKIN: There are no ulcers or rashes noted. PSYCHIATRIC: The patient has a normal affect.  DATA:  I reviewed her ultrasound today which shows that the maximum diameter of her aneurysm is 7.9 cm.  MEDICAL ISSUES: JUXTARENAL ABDOMINAL AORTIC ANEURYSM: I have again had a long discussion with the patient's daughter. We both agree that she is not a candidate for repair of her juxtarenal aneurysm. She has multiple medical comorbidities, is markedly debilitated with significant dementia. This reason we will not obtain routine follow up ultrasound studies. She would like me to see her back on a routine basis and I have arranged to follow up visit in 9 months. I have encouraged her to make it clear at the nursing facility that if she has sudden collapse or evidence of aneurysm rupture that she be made clear that she does not wish intervention. She is DO NOT RESUSCITATE.   Return in about 9 months (around 07/23/2015).   Deitra Mayo [facility] [place]: [phone]

## 2014-10-29 ENCOUNTER — Ambulatory Visit: Payer: Medicare Other | Admitting: Gastroenterology

## 2014-12-15 ENCOUNTER — Non-Acute Institutional Stay (SKILLED_NURSING_FACILITY): Payer: Medicare Other | Admitting: Internal Medicine

## 2014-12-15 ENCOUNTER — Encounter: Payer: Self-pay | Admitting: Internal Medicine

## 2014-12-15 DIAGNOSIS — F0391 Unspecified dementia with behavioral disturbance: Secondary | ICD-10-CM

## 2014-12-15 DIAGNOSIS — A047 Enterocolitis due to Clostridium difficile: Secondary | ICD-10-CM

## 2014-12-15 DIAGNOSIS — F03918 Unspecified dementia, unspecified severity, with other behavioral disturbance: Secondary | ICD-10-CM

## 2014-12-15 DIAGNOSIS — I714 Abdominal aortic aneurysm, without rupture, unspecified: Secondary | ICD-10-CM

## 2014-12-15 DIAGNOSIS — J438 Other emphysema: Secondary | ICD-10-CM | POA: Diagnosis not present

## 2014-12-15 DIAGNOSIS — A0472 Enterocolitis due to Clostridium difficile, not specified as recurrent: Secondary | ICD-10-CM

## 2014-12-15 DIAGNOSIS — N289 Disorder of kidney and ureter, unspecified: Secondary | ICD-10-CM

## 2014-12-15 DIAGNOSIS — I10 Essential (primary) hypertension: Secondary | ICD-10-CM

## 2014-12-15 NOTE — Progress Notes (Signed)
Patient ID: [name], female   DOB: 1926-06-30, 81 y.o.   MRN: [id number]     This is a routine visit   Level care skilled.  Facility [facility].     Chief complaint -- Medical management of chronic medical issues including dementia with behaviors anemia chronic back pain chronic kidney disease COPD abdominal aortic aneurysm depression  Historyof present illness---; this is a patient who is a long-term resident of this facility--most recently she was admitted to hospital with abdominal pain, decreased responsiveness and apparent hypotension when EMS arrived the. She was found to have a UTI and was given Rocephin a. She did have a CT scan of the abdomen that showed enlarging of an already very large infrarenal abdominal aortic aneurysm.  She also had diarrhea and was treated for C. difficile with vancomycin this has been stable recently   -she has been seen by psychiatric nurse practitioner-who has been adjusting her medications currently she is on the Lamictal 100 mg a day and Seroquel twice a day 25 mg earlier in the day 50 mg in the evening. \She also continues on Aricept and Namenda  Her behaviors have stabilized although she still has occasional episodes of anxiety aggressive illness combativeness--however nursing staff says this has significantly improved recently .  Her weight has been relatively stable-currently at about 100 pounds --apparently she eats well with encouragement and assistance     In regards to GI issues- With history of large abdominal aortic aneurysm thought nonoperable-they have seen the vascular surgeon and he again has reiterated that she is a poor surgical candidate and family is aware of this with no aggressive intervention-family is comfortable with this-there is follow-up arranged in approximately 7 months        Past Medical History  Diagnosis Date  . Hypertension   . Memory loss   . Anemia   . Chronic back pain   . Chronic kidney  disease   . Anxiety   . Panic attack   . Chronic respiratory failure 04/30/2012  . COPD (chronic obstructive pulmonary disease)   . AAA (abdominal aortic aneurysm)   . Cancer     skin Left wrist BCC  . DVT (deep venous thrombosis)   . Aortic aneurysm, abdominal   . Back injury     has had broken back x 2  . Knee problem     Right knee pain  . Arm fracture, right   . Clostridium difficile enteritis 02/2013    treated   . PMB (postmenopausal bleeding) 03/12/2013  . Depression   . Dementia   . Fall at nursing home Aug. 2015    [facility]   Past Surgical History  Procedure Laterality Date  . Hemorrhoid surgery    . Hip arthroplasty Right 01/08/2013    Procedure: ARTHROPLASTY BIPOLAR HIP;  Surgeon: Nita Sells, MD;  Location: [facility];  Service: Orthopedics;  Laterality: Right;  . Fracture surgery Right January 08, 2013    Hip   Current Outpatient Prescriptions on File Prior to Visit  Medication Sig Dispense Refill  . acetaminophen (TYLENOL) 500 MG tablet Take 500 mg by mouth every 6 (six) hours as needed for pain.    Roseanne Kaufman Peru-Castor Oil (VENELEX) OINT Apply topically 3 (three) times daily as needed.    . budesonide (PULMICORT) 0.25 MG/2ML nebulizer solution Take 2 mLs (0.25 mg total) by nebulization 2 (two) times daily. 60 mL 12  . cholecalciferol (VITAMIN D) 1000 UNITS tablet  Take 1,000 Units by mouth daily.    . cyanocobalamin (,VITAMIN B-12,) 1000 MCG/ML injection Inject 1,000 mcg into the muscle every 30 (thirty) days.    Marland Kitchen dextromethorphan-guaiFENesin (ROBITUSSIN-DM) 10-100 MG/5ML liquid Take 10 mLs by mouth every 4 (four) hours as needed for cough.    . donepezil (ARICEPT) 10 MG tablet Take 10 mg by mouth at bedtime.    . feeding supplement (ENSURE COMPLETE) LIQD Take 237 mLs by mouth 2 (two) times daily between meals.    . folic acid (FOLVITE) 1 MG tablet Take 1 mg by mouth daily.    Marland Kitchen lamoTRIgine (LAMICTAL) 100 MG tablet Take 100 mg by mouth daily.    .  magnesium hydroxide (MILK OF MAGNESIA) 400 MG/5ML suspension Take by mouth daily as needed for mild constipation.    . memantine (NAMENDA) 10 MG tablet Take 10 mg by mouth 2 (two) times daily.    . Multiple Vitamin (MULTIVITAMIN WITH MINERALS) TABS Take 1 tablet by mouth daily.    Marland Kitchen omeprazole (PRILOSEC) 20 MG capsule Take 20 mg by mouth 2 (two) times daily before a meal. For GERD    . potassium chloride (K-DUR,KLOR-CON) 40 meq MEQt  .    Marland Kitchen QUEtiapine (SEROQUEL) 25 MG tablet Take 25 mg by mouth daily.     . QUEtiapine (SEROQUEL) 50 MG tablet Take 50 mg by mouth at bedtime.    .  Prilosec 20 mg daily   120 tablet 5  .         Social; the patient has recently been made a DO NOT RESUSCITATE apparently only during this hospitalization   her mother is deceased. her father is deceased. her maternal grandmother is deceased. She indicated that her maternal grandfather is deceased. t her paternal grandmother is deceased. her paternal grandfather is deceased.   Review of systems; This is not possible from the patient obtained from nursing. In general no complaint fever or chills.  Skin no complaints of rashes or itching has some chronic bruising.  Head ears eyes nose mouth and throat has prescription lenses does not complain of visual changes or sore throat.  Rest. No complaints of shortness breath or cough.  Cardiac no chest pain minimal lower extremity edema.  GI does not complain of abdominal pain apparently H quite well no nausea or vomiting noted.  GU no complaints of dysuria.  Muscle skeletal has chronic pain especially back pain this appears to be controlled with Tylenol currently.  Neurologic does not complain of dizziness or headache no recent syncopal episodes does have a history of these in the past.  Psych severe dementia although this is stabilized recently with her behaviors     Physical examination She is afebrile pulse 56 respirations 18 blood pressure taken manually  today 138/80 weight is 99.8 appears to be stable to slowly rising  Gen. very frail woman with severe dementia. She is resting comfortably she is alert but confused Her skin is warm and dry do not see any increased bruising beyond baseline-.  Eyes she has prescription lenses pupils appear reactive light sclera and conjunctiva clear visual acuity appears to be intact  Oropharynx is clear mucous membranes appear fairly moist  Respiratory fairly clear entry bilaterally Cardiac regular rate and rhythm without murmur gallop or rub-she does not have significant lower extremity edema-heart sounds are distant. Abdomen; bowel sounds are positive nontender. History of palpable aortic aneurysm. Muscle skeletal general frailty but I do not note any deformity she does move all  extremities 4 with baseline strength.   -  Neurologic is grossly intact --speech is clear-no lateralizing findings  Psych-has severe dementia but is pleasant and cooperative-follows some verbal commands    Labs  10/18/2014.  Sodium 141 potassium 4.3 BUN 23 creatinine 1.15.  WBC 7.9 hemoglobin 12.0 platelets are 210.  10/11/2014.  Liver function tests within normal limits except albumen of 2.4.  10/11/2014.  WBC 5.8 hemoglobin 11.3 platelets 219.  Sodium 140 potassium 4.3 BUN 14 creatinine 0.85 CO2 of 29.  Liver function tests within normal limits except albumin of 2.4  .  Impression/plan  #1 abdominal pain and the presence of hypotension. She was found to have pseudomembranous colitis in the hospital and finished treatment with vancomycin. Also an enlarging/very large abdominal aortic aneurysm. She is not a operative candidate and apparently this is already been deemed isn't operable. - Family has seen vascular surgery about this and follow-up has been arranged.  In regards to the C. difficile there's been no recurrence-   #2 severe dementia on Aricept  Namenda  Lamictal and Seroquel.  This appears to  have stabilized somewhat she is followed closely by psychiatric nurse practitioner  #3 very large abdominal aortic aneurysm for which she is not a surgical candidate--as noted above . #4 COPD this appears to be stable   #5-history of chronic back pain this appears to be relatively stable she is on Tylenol routinely twice a day and when necessary.  #6 apparent history of hypokalemia she is on aggressive potassium supplementation Will update a BMP.  #7 high-risk meds she is on Seroquel would like to update a hemoglobin A1c also will update a CBC and basic metabolic panel.  #8 history of anemia she is on B12 supplementation Will update a B12 level as well as a CBC.    #9 chronic kidney disease this has been stable recently with a BUN of 23 creatinine 1.15 in May will update again a metabolic panel  #60 hypertension-this is complicated with some history of hypotension as well-she has variable blood pressures I see listed readings 152/84-147/56-I got 138/80 manually at this point will monitor.  This is complicated somewhat with a history of syncopal episodes which have not occurred for some time-her Norvasc was discontinued however secondary to this  #11 weight loss-her weight appears to be relatively stable at around 100 pounds-there has been some fluctuation apparently nursing staff is encouraging by mouth intake and she is receiving assistance which is helping at this point will monitor.-Speaking with dietary today her weight is trending up again  #12-do note per chart review history of vaginal bleeding-she has been seen by GYN-pelvic ultrasound did not show any acute issues-suggestion for follow-up as necessary there been no further episodes of bleeding for some time.  #13 history of Hemoccult-positive stool-this was thought possibly secondary to C. difficile hemoglobin has remained stable--she is on Prilosec with some history of GERD I suspect this will have GI protective effects as  well.  [id number] note greater than 35 minutes spent assessing patient-discussing her status with nursing staff-reviewing her chart-and coordinating and formulating a plan of care for numerous diagnoses-of note greater than 50% of time spent coordinating plan of care  .  7.5        3.41 (L)        10.3 (L)        31.4 (L)        92.1        30.2        32.8        15.5        140 (L)

## 2014-12-16 ENCOUNTER — Other Ambulatory Visit (HOSPITAL_COMMUNITY)
Admission: AD | Admit: 2014-12-16 | Discharge: 2014-12-16 | Disposition: A | Discharge status: Home / Self Care | Payer: Medicare Other | Source: Skilled Nursing Facility | Attending: Internal Medicine | Admitting: Internal Medicine

## 2014-12-16 DIAGNOSIS — E119 Type 2 diabetes mellitus without complications: Secondary | ICD-10-CM | POA: Insufficient documentation

## 2014-12-16 DIAGNOSIS — I10 Essential (primary) hypertension: Secondary | ICD-10-CM | POA: Diagnosis present

## 2014-12-16 DIAGNOSIS — D500 Iron deficiency anemia secondary to blood loss (chronic): Secondary | ICD-10-CM | POA: Insufficient documentation

## 2014-12-16 LAB — COMPREHENSIVE METABOLIC PANEL
ALT: 15 U/L (ref 14–54)
AST: 22 U/L (ref 15–41)
Albumin: 3 g/dL — ABNORMAL LOW (ref 3.5–5.0)
Alkaline Phosphatase: 93 U/L (ref 38–126)
Anion gap: 4 — ABNORMAL LOW (ref 5–15)
BUN: 32 mg/dL — ABNORMAL HIGH (ref 6–20)
CO2: 30 mmol/L (ref 22–32)
CREATININE: 1.01 mg/dL — AB (ref 0.44–1.00)
Calcium: 9.1 mg/dL (ref 8.9–10.3)
Chloride: 106 mmol/L (ref 101–111)
GFR, EST AFRICAN AMERICAN: 57 mL/min — AB (ref >60)
GFR, EST NON AFRICAN AMERICAN: 49 mL/min — AB (ref >60)
Glucose, Bld: 97 mg/dL (ref 65–99)
Potassium: 4 mmol/L (ref 3.5–5.1)
SODIUM: 140 mmol/L (ref 135–145)
Total Bilirubin: 0.8 mg/dL (ref 0.3–1.2)
Total Protein: 6.3 g/dL — ABNORMAL LOW (ref 6.5–8.1)

## 2014-12-16 LAB — CBC WITH DIFFERENTIAL/PLATELET
BASOS ABS: 0 10*3/uL (ref 0.0–0.1)
BASOS PCT: 1 % (ref 0–1)
EOS ABS: 0.1 10*3/uL (ref 0.0–0.7)
EOS PCT: 2 % (ref 0–5)
HCT: 36.8 % (ref 36.0–46.0)
Hemoglobin: 11.5 g/dL — ABNORMAL LOW (ref 12.0–15.0)
LYMPHS PCT: 26 % (ref 12–46)
Lymphs Abs: 1.5 10*3/uL (ref 0.7–4.0)
MCH: 29.5 pg (ref 26.0–34.0)
MCHC: 31.3 g/dL (ref 30.0–36.0)
MCV: 94.4 fL (ref 78.0–100.0)
MONO ABS: 0.4 10*3/uL (ref 0.1–1.0)
Monocytes Relative: 8 % (ref 3–12)
Neutro Abs: 3.6 10*3/uL (ref 1.7–7.7)
Neutrophils Relative %: 63 % (ref 43–77)
Platelets: 139 10*3/uL — ABNORMAL LOW (ref 150–400)
RBC: 3.9 MIL/uL (ref 3.87–5.11)
RDW: 15.9 % — AB (ref 11.5–15.5)
WBC: 5.6 10*3/uL (ref 4.0–10.5)

## 2014-12-16 LAB — VITAMIN B12: Vitamin B-12: 892 pg/mL (ref 180–914)

## 2014-12-17 LAB — HEMOGLOBIN A1C
Hgb A1c MFr Bld: 5.1 % (ref 4.8–5.6)
Mean Plasma Glucose: 100 mg/dL

## 2014-12-24 ENCOUNTER — Ambulatory Visit (HOSPITAL_COMMUNITY): Payer: Medicare Other | Attending: Internal Medicine

## 2014-12-24 ENCOUNTER — Non-Acute Institutional Stay (SKILLED_NURSING_FACILITY): Payer: Medicare Other | Admitting: Internal Medicine

## 2014-12-24 ENCOUNTER — Encounter: Payer: Self-pay | Admitting: Internal Medicine

## 2014-12-24 DIAGNOSIS — M489 Spondylopathy, unspecified: Secondary | ICD-10-CM | POA: Diagnosis present

## 2014-12-24 DIAGNOSIS — IMO0002 Reserved for concepts with insufficient information to code with codable children: Secondary | ICD-10-CM | POA: Insufficient documentation

## 2014-12-24 DIAGNOSIS — I10 Essential (primary) hypertension: Secondary | ICD-10-CM | POA: Diagnosis not present

## 2014-12-24 DIAGNOSIS — N289 Disorder of kidney and ureter, unspecified: Secondary | ICD-10-CM | POA: Diagnosis not present

## 2014-12-24 DIAGNOSIS — M954 Acquired deformity of chest and rib: Secondary | ICD-10-CM | POA: Diagnosis not present

## 2014-12-24 NOTE — Progress Notes (Signed)
Patient ID: [name], female   DOB: 11-Apr-1927, 81 y.o.   MRN: [id number]      This is an acute visit   Level care skilled.  Facility [facility].     Chief complaint --  Acute visit secondary to question lump left mid spine  Historyof present illness---  Patient is an elderly resident staff has noted possibly a small lump on her left Spine area-this is somewhat questionable and may be just the way she is lying in bed-area does not appear to be painful there's been no recent history of trauma or falls to my knowledge otherwise patient remains at her baseline  .           Past Medical History  Diagnosis Date  . Hypertension   . Memory loss   . Anemia   . Chronic back pain   . Chronic kidney disease   . Anxiety   . Panic attack   . Chronic respiratory failure 04/30/2012  . COPD (chronic obstructive pulmonary disease)   . AAA (abdominal aortic aneurysm)   . Cancer     skin Left wrist BCC  . DVT (deep venous thrombosis)   . Aortic aneurysm, abdominal   . Back injury     has had broken back x 2  . Knee problem     Right knee pain  . Arm fracture, right   . Clostridium difficile enteritis 02/2013    treated   . PMB (postmenopausal bleeding) 03/12/2013  . Depression   . Dementia   . Fall at nursing home Aug. 2015    [facility]   Past Surgical History  Procedure Laterality Date  . Hemorrhoid surgery    . Hip arthroplasty Right 01/08/2013    Procedure: ARTHROPLASTY BIPOLAR HIP;  Surgeon: Nita Sells, MD;  Location: [facility];  Service: Orthopedics;  Laterality: Right;  . Fracture surgery Right January 08, 2013    Hip   Current Outpatient Prescriptions on File Prior to Visit  Medication Sig Dispense Refill  . acetaminophen (TYLENOL) 500 MG tablet Take 500 mg by mouth every 6 (six) hours as needed for pain.    Roseanne Kaufman Peru-Castor Oil (VENELEX) OINT Apply topically 3 (three) times daily as needed.    . budesonide (PULMICORT) 0.25 MG/2ML  nebulizer solution Take 2 mLs (0.25 mg total) by nebulization 2 (two) times daily. 60 mL 12  . cholecalciferol (VITAMIN D) 1000 UNITS tablet Take 1,000 Units by mouth daily.    . cyanocobalamin (,VITAMIN B-12,) 1000 MCG/ML injection Inject 1,000 mcg into the muscle every 30 (thirty) days.    Marland Kitchen dextromethorphan-guaiFENesin (ROBITUSSIN-DM) 10-100 MG/5ML liquid Take 10 mLs by mouth every 4 (four) hours as needed for cough.    . donepezil (ARICEPT) 10 MG tablet Take 10 mg by mouth at bedtime.    . feeding supplement (ENSURE COMPLETE) LIQD Take 237 mLs by mouth 2 (two) times daily between meals.    . folic acid (FOLVITE) 1 MG tablet Take 1 mg by mouth daily.    Marland Kitchen lamoTRIgine (LAMICTAL) 100 MG tablet Take 100 mg by mouth daily.    . magnesium hydroxide (MILK OF MAGNESIA) 400 MG/5ML suspension Take by mouth daily as needed for mild constipation.    . memantine (NAMENDA) 10 MG tablet Take 10 mg by mouth 2 (two) times daily.    . Multiple Vitamin (MULTIVITAMIN WITH MINERALS) TABS Take 1 tablet by mouth daily.    Marland Kitchen omeprazole (PRILOSEC) 20 MG capsule  Take 20 mg by mouth 2 (two) times daily before a meal. For GERD    . potassium chloride (K-DUR,KLOR-CON) 40 meq MEQt  .    Marland Kitchen QUEtiapine (SEROQUEL) 25 MG tablet Take 25 mg by mouth daily.     . QUEtiapine (SEROQUEL) 50 MG tablet Take 50 mg by mouth at bedtime.    .  Prilosec 20 mg daily   120 tablet 5  .         Social; the patient has recently been made a DO NOT RESUSCITATE apparently only during this hospitalization   her mother is deceased. her father is deceased. her maternal grandmother is deceased. She indicated that her maternal grandfather is deceased. t her paternal grandmother is deceased. her paternal grandfather is deceased.   Review of systems; This is not possible from the patient obtained from nursing. In general no complaint fever or chills.  Respiratory no complaints of shortness of breath or increased cough or increased respiratory  effort.  Cardiac no complaints of chest pain    Muscle skeletal has chronic pain especially back pain this appears to be controlled with Tylenol currently. Pain does not appear to be increased from baseline        Physical examination  Temperature 97.2 pulse 68 respirations 20 blood pressure 148/79  Gen. very frail woman with severe dementia. She is resting comfortably she is alert but confused Her skin is warm and dry do not see any increased bruising beyond baseline-.     Respiratory fairly clear entry bilaterally no labored breathing Cardiac regular rate and rhythm-she does not have significant lower extremity edema-heart sounds are distant. Abdomen; bowel sounds are positive nontender. History of palpable aortic aneurysm. Muscle skeletal general frailty b Left mid back there appears to be somewhat of a bony prominence but I suspect this is not really new just the way she is lying in bed this is not really tender or erythematous appears to have possibly a small amount of fatty tissue.   -  Neurologic is grossly intact --speech is clear-no lateralizing findings  Psych-has severe dementia but is pleasant and cooperative-follows some verbal commands    Labs  12/10/2014.  Sodium 140 potassium 4 BUN 32 creatinine 1.01.  Liver function tests within normal limits except albumen of 3.0.  WBC 5.6 hemoglobin 11.5 platelets 139  10/18/2014.  Sodium 141 potassium 4.3 BUN 23 creatinine 1.15.  WBC 7.9 hemoglobin 12.0 platelets are 210.  10/11/2014.  Liver function tests within normal limits except albumen of 2.4.  10/11/2014.  WBC 5.8 hemoglobin 11.3 platelets 219.  Sodium 140 potassium 4.3 BUN 14 creatinine 0.85 CO2 of 29.  Liver function tests within normal limits except albumin of 2.4  .  Impression/plan  #1-history of bony prominence left mid spine-suspect this is chronic but will order an x-ray of the lumbar and thoracic spine to rule out anything  acute She does have a history of T12 compression fractures and degenerative changes per review of previous x-rays        #2 chronic kidney disease this has been stable recently with a BUN of 32 and creatinine 1.01  #3 hypertension-this is complicated with some history of hypotension as well-she has variable blood pressures -I suspect this is complicated with her history of agitation-at this point will monitor I see systolics ranging from the 130s 150s generally  [id number].       Marland Kitchen  7.5        3.41 (L)        10.3 (L)        31.4 (L)        92.1        30.2        32.8        15.5        140 (L)

## 2014-12-29 ENCOUNTER — Encounter: Payer: Self-pay | Admitting: Internal Medicine

## 2014-12-29 ENCOUNTER — Non-Acute Institutional Stay (SKILLED_NURSING_FACILITY): Payer: Medicare Other | Admitting: Internal Medicine

## 2014-12-29 DIAGNOSIS — R05 Cough: Secondary | ICD-10-CM

## 2014-12-29 DIAGNOSIS — IMO0001 Reserved for inherently not codable concepts without codable children: Secondary | ICD-10-CM

## 2014-12-29 DIAGNOSIS — R059 Cough, unspecified: Secondary | ICD-10-CM

## 2014-12-29 DIAGNOSIS — M4850XD Collapsed vertebra, not elsewhere classified, site unspecified, subsequent encounter for fracture with routine healing: Secondary | ICD-10-CM

## 2014-12-29 NOTE — Progress Notes (Signed)
Patient ID: [name], female   DOB: 09-19-1926, 81 y.o.   MRN: [id number]       This is an acute visit   Level care skilled.  Facility [facility].     Chief complaint --  Acute visit secondary to cough  Historyof present illness---  Patient is a long-term care resident here who apparently has developed somewhat of a dry cough today nursing is asked me to look at her.  She is not running a fever she denies any shortness of breath although again she does have significant dementia and is a very poor historian.  She does have a listed history of COPD although this is been quite stable for an extended period of time     Past Medical History  Diagnosis Date  . Hypertension   . Memory loss   . Anemia   . Chronic back pain   . Chronic kidney disease   . Anxiety   . Panic attack   . Chronic respiratory failure 04/30/2012  . COPD (chronic obstructive pulmonary disease)   . AAA (abdominal aortic aneurysm)   . Cancer     skin Left wrist BCC  . DVT (deep venous thrombosis)   . Aortic aneurysm, abdominal   . Back injury     has had broken back x 2  . Knee problem     Right knee pain  . Arm fracture, right   . Clostridium difficile enteritis 02/2013    treated   . PMB (postmenopausal bleeding) 03/12/2013  . Depression   . Dementia   . Fall at nursing home Aug. 2015    [facility]   Past Surgical History  Procedure Laterality Date  . Hemorrhoid surgery    . Hip arthroplasty Right 01/08/2013    Procedure: ARTHROPLASTY BIPOLAR HIP;  Surgeon: Nita Sells, MD;  Location: [facility];  Service: Orthopedics;  Laterality: Right;  . Fracture surgery Right January 08, 2013    Hip   Current Outpatient Prescriptions on File Prior to Visit  Medication Sig Dispense Refill  . acetaminophen (TYLENOL) 500 MG tablet Take 500 mg by mouth every 6 (six) hours as needed for pain.    Roseanne Kaufman Peru-Castor Oil (VENELEX) OINT Apply topically 3 (three) times daily as needed.     . budesonide (PULMICORT) 0.25 MG/2ML nebulizer solution Take 2 mLs (0.25 mg total) by nebulization 2 (two) times daily. 60 mL 12  . cholecalciferol (VITAMIN D) 1000 UNITS tablet Take 1,000 Units by mouth daily.    . cyanocobalamin (,VITAMIN B-12,) 1000 MCG/ML injection Inject 1,000 mcg into the muscle every 30 (thirty) days.    Marland Kitchen dextromethorphan-guaiFENesin (ROBITUSSIN-DM) 10-100 MG/5ML liquid Take 10 mLs by mouth every 4 (four) hours as needed for cough.    . donepezil (ARICEPT) 10 MG tablet Take 10 mg by mouth at bedtime.    . feeding supplement (ENSURE COMPLETE) LIQD Take 237 mLs by mouth 2 (two) times daily between meals.    . folic acid (FOLVITE) 1 MG tablet Take 1 mg by mouth daily.    Marland Kitchen lamoTRIgine (LAMICTAL) 100 MG tablet Take 100 mg by mouth daily.    . magnesium hydroxide (MILK OF MAGNESIA) 400 MG/5ML suspension Take by mouth daily as needed for mild constipation.    . memantine (NAMENDA) 10 MG tablet Take 10 mg by mouth 2 (two) times daily.    . Multiple Vitamin (MULTIVITAMIN WITH MINERALS) TABS Take 1 tablet by mouth daily.    Marland Kitchen  omeprazole (PRILOSEC) 20 MG capsule Take 20 mg by mouth 2 (two) times daily before a meal. For GERD    . potassium chloride (K-DUR,KLOR-CON) 40 meq MEQt  .    Marland Kitchen QUEtiapine (SEROQUEL) 25 MG tablet Take 25 mg by mouth daily.     . QUEtiapine (SEROQUEL) 50 MG tablet Take 50 mg by mouth at bedtime.    .  Prilosec 20 mg daily   120 tablet 5  .         Social; the patient has recently been made a DO NOT RESUSCITATE apparently only during this hospitalization   her mother is deceased. her father is deceased. her maternal grandmother is deceased. She indicated that her maternal grandfather is deceased. t her paternal grandmother is deceased. her paternal grandfather is deceased.   Review of systems; This is not possible from the patient obtained from nursing. In general no complaint fever or chills. Skin-was seen last week for what appear to be somewhat of  a protrusion on left lower back spinet x-ray did not show any acute changes--it did show chronic compression fractures and kyphosis which is not new  Respiratory no complaints of shortness of breath --has a dry cough.  Cardiac no complaints of chest pain    Muscle skeletal has chronic pain especially back pain this appears to be controlled with Tylenol currently. Pain does not appear to be increased from baseline        Physical examination  T- 97.8 pulse 83 respirations 19 blood pressure 144/82 O2 saturation 100% on room air  Gen. very frail woman with severe dementia. She is resting comfortably she is alert but confused Her skin is warm and dry do not see any increased bruising beyond baseline Left mid back to have that small protrusion as noted last week-there is a somewhat violaceous darkened area of skin that is not open or  tender to palpation it is not warm appears to be more fatty tissue as it appeared last week-.     Respiratory -lungs are clear there is no labored breathing Cardiac regular rate and rhythm-she does not have significant lower extremity edema-. Abdomen; bowel sounds are positive nontender. History of palpable aortic aneurysm. Muscle skeletal general frailty b Left mid back there appears to be somewhat of a bony prominence As noted above this does not really appear to   Be changed compare to last week   -  Neurologic is grossly intact --speech is clear-no lateralizing findings  Psych-history of severe dementia she is somewhat agitated with exam which is not unusual-follows some verbal commands    Labs  12/16/2014.  Sodium 140 potassium 4 BUN 32 creatinine 1.01.  Albumen 3.0 otherwise liver function tests within normal limits.  WBC 5.6 hemoglobin 11.5 platelets 139.    12/10/2014.  Sodium 140 potassium 4 BUN 32 creatinine 1.01.  Liver function tests within normal limits except albumen of 3.0.  WBC 5.6 hemoglobin 11.5 platelets  139  10/18/2014.  Sodium 141 potassium 4.3 BUN 23 creatinine 1.15.  WBC 7.9 hemoglobin 12.0 platelets are 210.  10/11/2014.  Liver function tests within normal limits except albumen of 2.4.  10/11/2014.  WBC 5.8 hemoglobin 11.3 platelets 219.  Sodium 140 potassium 4.3 BUN 14 creatinine 0.85 CO2 of 29.  Liver function tests within normal limits except albumin of 2.4  .  Impression/plan  Cough-patient does not appear to be in any distress or discomfort whatsoever-lung exam is quite benign-will write an order for Robitussin 10 mL  routine every 6 hours 72 hours and then when necessary and have vital signs pulse ox monitored as well for a couple days to keep an eye on this and make sure it does not progress-- if it progresses or sign of congestion will order a x-ray  #-history of bony prominence left mid spine-suspect this is chronic   She does have a history of thoracic and lumbar chronic compression fractures and kyphosis Staff will have to try to keep this area protected however I do not see any side of infection or warmth currently Will write order the pad area and change every 2 days and certainly monitor for any changes--increased warmth tenderness or erythema           [id number].       Marland Kitchen                                                                                                                                                                                                  7.5        3.41 (L)        10.3 (L)        31.4 (L)        92.1        30.2        32.8        15.5        140 (L)

## 2014-12-30 ENCOUNTER — Ambulatory Visit (HOSPITAL_COMMUNITY): Payer: Medicare Other | Attending: Internal Medicine

## 2014-12-30 DIAGNOSIS — R911 Solitary pulmonary nodule: Secondary | ICD-10-CM | POA: Insufficient documentation

## 2014-12-30 DIAGNOSIS — R05 Cough: Secondary | ICD-10-CM | POA: Insufficient documentation

## 2015-01-06 ENCOUNTER — Ambulatory Visit (HOSPITAL_COMMUNITY): Payer: Medicare Other | Attending: Internal Medicine

## 2015-01-06 DIAGNOSIS — R918 Other nonspecific abnormal finding of lung field: Secondary | ICD-10-CM | POA: Insufficient documentation

## 2015-01-06 DIAGNOSIS — R05 Cough: Secondary | ICD-10-CM | POA: Insufficient documentation

## 2015-01-10 ENCOUNTER — Encounter (HOSPITAL_COMMUNITY)
Admission: AD | Admit: 2015-01-10 | Discharge: 2015-01-10 | Disposition: A | Discharge status: Home / Self Care | Payer: Medicare Other | Source: Skilled Nursing Facility | Attending: Internal Medicine | Admitting: Internal Medicine

## 2015-01-10 DIAGNOSIS — R8290 Unspecified abnormal findings in urine: Secondary | ICD-10-CM | POA: Diagnosis not present

## 2015-01-10 DIAGNOSIS — I10 Essential (primary) hypertension: Secondary | ICD-10-CM | POA: Insufficient documentation

## 2015-01-10 DIAGNOSIS — J449 Chronic obstructive pulmonary disease, unspecified: Secondary | ICD-10-CM | POA: Diagnosis present

## 2015-01-10 DIAGNOSIS — D649 Anemia, unspecified: Secondary | ICD-10-CM | POA: Insufficient documentation

## 2015-01-10 LAB — CBC WITH DIFFERENTIAL/PLATELET
Basophils Absolute: 0 10*3/uL (ref 0.0–0.1)
Basophils Relative: 0 % (ref 0–1)
EOS PCT: 0 % (ref 0–5)
Eosinophils Absolute: 0 10*3/uL (ref 0.0–0.7)
HCT: 35.3 % — ABNORMAL LOW (ref 36.0–46.0)
Hemoglobin: 11.3 g/dL — ABNORMAL LOW (ref 12.0–15.0)
LYMPHS PCT: 11 % — AB (ref 12–46)
Lymphs Abs: 1.3 10*3/uL (ref 0.7–4.0)
MCH: 29.4 pg (ref 26.0–34.0)
MCHC: 32 g/dL (ref 30.0–36.0)
MCV: 91.9 fL (ref 78.0–100.0)
MONO ABS: 1.2 10*3/uL — AB (ref 0.1–1.0)
Monocytes Relative: 10 % (ref 3–12)
NEUTROS ABS: 9.2 10*3/uL — AB (ref 1.7–7.7)
NEUTROS PCT: 79 % — AB (ref 43–77)
PLATELETS: 197 10*3/uL (ref 150–400)
RBC: 3.84 MIL/uL — ABNORMAL LOW (ref 3.87–5.11)
RDW: 14.4 % (ref 11.5–15.5)
WBC: 11.7 10*3/uL — ABNORMAL HIGH (ref 4.0–10.5)

## 2015-01-10 LAB — COMPREHENSIVE METABOLIC PANEL
ALK PHOS: 136 U/L — AB (ref 38–126)
ALT: 10 U/L — ABNORMAL LOW (ref 14–54)
AST: 17 U/L (ref 15–41)
Albumin: 2.6 g/dL — ABNORMAL LOW (ref 3.5–5.0)
Anion gap: 6 (ref 5–15)
BUN: 20 mg/dL (ref 6–20)
CALCIUM: 8.6 mg/dL — AB (ref 8.9–10.3)
CHLORIDE: 102 mmol/L (ref 101–111)
CO2: 28 mmol/L (ref 22–32)
CREATININE: 0.83 mg/dL (ref 0.44–1.00)
GFR calc Af Amer: >60 mL/min (ref >60)
GLUCOSE: 78 mg/dL (ref 65–99)
POTASSIUM: 4 mmol/L (ref 3.5–5.1)
Sodium: 136 mmol/L (ref 135–145)
Total Bilirubin: 1.4 mg/dL — ABNORMAL HIGH (ref 0.3–1.2)
Total Protein: 6.3 g/dL — ABNORMAL LOW (ref 6.5–8.1)

## 2015-01-11 DIAGNOSIS — D649 Anemia, unspecified: Secondary | ICD-10-CM | POA: Diagnosis not present

## 2015-01-11 LAB — CBC WITH DIFFERENTIAL/PLATELET
BASOS PCT: 0 % (ref 0–1)
Basophils Absolute: 0 10*3/uL (ref 0.0–0.1)
Eosinophils Absolute: 0 10*3/uL (ref 0.0–0.7)
Eosinophils Relative: 0 % (ref 0–5)
HCT: 35.7 % — ABNORMAL LOW (ref 36.0–46.0)
HEMOGLOBIN: 11.5 g/dL — AB (ref 12.0–15.0)
LYMPHS ABS: 1.6 10*3/uL (ref 0.7–4.0)
Lymphocytes Relative: 13 % (ref 12–46)
MCH: 29.4 pg (ref 26.0–34.0)
MCHC: 32.2 g/dL (ref 30.0–36.0)
MCV: 91.3 fL (ref 78.0–100.0)
Monocytes Absolute: 1.5 10*3/uL — ABNORMAL HIGH (ref 0.1–1.0)
Monocytes Relative: 12 % (ref 3–12)
NEUTROS ABS: 9.6 10*3/uL — AB (ref 1.7–7.7)
Neutrophils Relative %: 75 % (ref 43–77)
PLATELETS: 239 10*3/uL (ref 150–400)
RBC: 3.91 MIL/uL (ref 3.87–5.11)
RDW: 14.4 % (ref 11.5–15.5)
WBC: 12.8 10*3/uL — ABNORMAL HIGH (ref 4.0–10.5)

## 2015-01-11 LAB — URINALYSIS, ROUTINE W REFLEX MICROSCOPIC
Bilirubin Urine: NEGATIVE
GLUCOSE, UA: NEGATIVE mg/dL
KETONES UR: NEGATIVE mg/dL
Nitrite: NEGATIVE
Protein, ur: NEGATIVE mg/dL
SPECIFIC GRAVITY, URINE: 1.02 (ref 1.005–1.030)
Urobilinogen, UA: 0.2 mg/dL (ref 0.0–1.0)
pH: 5.5 (ref 5.0–8.0)

## 2015-01-11 LAB — URINE MICROSCOPIC-ADD ON

## 2015-01-12 ENCOUNTER — Ambulatory Visit (HOSPITAL_COMMUNITY): Payer: Medicare Other | Attending: Internal Medicine

## 2015-01-12 DIAGNOSIS — I10 Essential (primary) hypertension: Secondary | ICD-10-CM | POA: Diagnosis not present

## 2015-01-12 DIAGNOSIS — J189 Pneumonia, unspecified organism: Secondary | ICD-10-CM | POA: Diagnosis not present

## 2015-01-13 DIAGNOSIS — D649 Anemia, unspecified: Secondary | ICD-10-CM | POA: Diagnosis not present

## 2015-01-13 LAB — CBC WITH DIFFERENTIAL/PLATELET
BASOS ABS: 0 10*3/uL (ref 0.0–0.1)
Basophils Relative: 1 % (ref 0–1)
EOS ABS: 0.1 10*3/uL (ref 0.0–0.7)
Eosinophils Relative: 1 % (ref 0–5)
HCT: 34.7 % — ABNORMAL LOW (ref 36.0–46.0)
HEMOGLOBIN: 11.2 g/dL — AB (ref 12.0–15.0)
LYMPHS PCT: 19 % (ref 12–46)
Lymphs Abs: 1.6 10*3/uL (ref 0.7–4.0)
MCH: 29.6 pg (ref 26.0–34.0)
MCHC: 32.3 g/dL (ref 30.0–36.0)
MCV: 91.6 fL (ref 78.0–100.0)
Monocytes Absolute: 0.9 10*3/uL (ref 0.1–1.0)
Monocytes Relative: 11 % (ref 3–12)
NEUTROS ABS: 5.8 10*3/uL (ref 1.7–7.7)
NEUTROS PCT: 69 % (ref 43–77)
Platelets: 197 10*3/uL (ref 150–400)
RBC: 3.79 MIL/uL — AB (ref 3.87–5.11)
RDW: 14.2 % (ref 11.5–15.5)
WBC: 8.4 10*3/uL (ref 4.0–10.5)

## 2015-01-13 LAB — URINE CULTURE: Culture: 40000

## 2015-01-25 ENCOUNTER — Non-Acute Institutional Stay (SKILLED_NURSING_FACILITY): Payer: Medicare Other | Admitting: Internal Medicine

## 2015-01-25 ENCOUNTER — Other Ambulatory Visit (HOSPITAL_COMMUNITY)
Admission: RE | Admit: 2015-01-25 | Discharge: 2015-01-25 | Disposition: A | Discharge status: Home / Self Care | Payer: Medicare Other | Source: Skilled Nursing Facility | Attending: Internal Medicine | Admitting: Internal Medicine

## 2015-01-25 ENCOUNTER — Encounter: Payer: Self-pay | Admitting: Internal Medicine

## 2015-01-25 DIAGNOSIS — D649 Anemia, unspecified: Secondary | ICD-10-CM | POA: Diagnosis not present

## 2015-01-25 DIAGNOSIS — R630 Anorexia: Secondary | ICD-10-CM

## 2015-01-25 DIAGNOSIS — D72829 Elevated white blood cell count, unspecified: Secondary | ICD-10-CM

## 2015-01-25 DIAGNOSIS — R634 Abnormal weight loss: Secondary | ICD-10-CM

## 2015-01-25 DIAGNOSIS — R1010 Upper abdominal pain, unspecified: Secondary | ICD-10-CM | POA: Insufficient documentation

## 2015-01-25 LAB — COMPREHENSIVE METABOLIC PANEL
ALT: 13 U/L — ABNORMAL LOW (ref 14–54)
AST: 22 U/L (ref 15–41)
Albumin: 2.4 g/dL — ABNORMAL LOW (ref 3.5–5.0)
Alkaline Phosphatase: 231 U/L — ABNORMAL HIGH (ref 38–126)
Anion gap: 8 (ref 5–15)
BUN: 30 mg/dL — AB (ref 6–20)
CHLORIDE: 102 mmol/L (ref 101–111)
CO2: 26 mmol/L (ref 22–32)
Calcium: 8.4 mg/dL — ABNORMAL LOW (ref 8.9–10.3)
Creatinine, Ser: 1.02 mg/dL — ABNORMAL HIGH (ref 0.44–1.00)
GFR calc Af Amer: 56 mL/min — ABNORMAL LOW (ref >60)
GFR calc non Af Amer: 48 mL/min — ABNORMAL LOW (ref >60)
Glucose, Bld: 74 mg/dL (ref 65–99)
Potassium: 4.8 mmol/L (ref 3.5–5.1)
SODIUM: 136 mmol/L (ref 135–145)
Total Bilirubin: 1.2 mg/dL (ref 0.3–1.2)
Total Protein: 6.5 g/dL (ref 6.5–8.1)

## 2015-01-25 LAB — CBC WITH DIFFERENTIAL/PLATELET
Basophils Absolute: 0 10*3/uL (ref 0.0–0.1)
Basophils Relative: 0 % (ref 0–1)
EOS PCT: 1 % (ref 0–5)
Eosinophils Absolute: 0.1 10*3/uL (ref 0.0–0.7)
HCT: 33.2 % — ABNORMAL LOW (ref 36.0–46.0)
Hemoglobin: 10.8 g/dL — ABNORMAL LOW (ref 12.0–15.0)
LYMPHS ABS: 1.4 10*3/uL (ref 0.7–4.0)
LYMPHS PCT: 11 % — AB (ref 12–46)
MCH: 29.3 pg (ref 26.0–34.0)
MCHC: 32.5 g/dL (ref 30.0–36.0)
MCV: 90.2 fL (ref 78.0–100.0)
MONO ABS: 1.1 10*3/uL — AB (ref 0.1–1.0)
Monocytes Relative: 9 % (ref 3–12)
Neutro Abs: 10.5 10*3/uL — ABNORMAL HIGH (ref 1.7–7.7)
Neutrophils Relative %: 79 % — ABNORMAL HIGH (ref 43–77)
Platelets: 217 10*3/uL (ref 150–400)
RBC: 3.68 MIL/uL — AB (ref 3.87–5.11)
RDW: 14.8 % (ref 11.5–15.5)
WBC: 13.1 10*3/uL — ABNORMAL HIGH (ref 4.0–10.5)

## 2015-01-25 NOTE — Progress Notes (Signed)
Patient ID: [name], female   DOB: 1926/07/30, 81 y.o.   MRN: [id number]      This is an acute visit   Level care skilled.  Facility [facility].     Chief complaint --  Acute visit follow-up  weight loss  Historyof present illness---; this is a patient who is a long-term resident of this facility--most recently she was admitted to hospital with abdominal pain, decreased responsiveness and apparent hypotension when EMS arrived the. She was found to have a UTI and was given Rocephin a. She did have a CT scan of the abdomen that showed enlarging of an already very large infrarenal abdominal aortic aneurysm.  She also had diarrhea and was treated for C. difficile with vancomycin this has been stable recently   -she has been seen by psychiatric nurse practitioner-who has been adjusting her medications currently she is on the Lamictal 100 mg a day and Seroquel twice a day 37.5 mg earlier in the day 50 mg in the evening. \She also continues on Aricept and Namenda  She continues to have behaviors at times   In regards to GI issues- With history of large abdominal aortic aneurysm thought nonoperable-they have seen the vascular surgeon and he again has reiterated that she is a poor surgical candidate and family is aware of this with no aggressive intervention-family is comfortable with this-there is follow-up arranged in approximately 6 months  Most acute issue over the past month appears to be weight loss and failure to thrive-at one point her weight has stabilized however it appears over the past month or so she's lost a significant amount await approximately 15 pounds.  She is on supplements-.  Now she is eating approximately 10% of her meal according to her nurse who is quite familiar with her.  Patient has severe dementia and cannot really give any review of systems-I suspect her poor appetite is largely related to her end-stage dementia  Lab done today shows an albumin of 2.4  which is down from 31 month ago renal function actually appears relatively stable she does have an elevated alkaline phosphatase however this is rising at 231 it was 136 several weeks ago and before that 94        Past Medical History  Diagnosis Date  . Hypertension   . Memory loss   . Anemia   . Chronic back pain   . Chronic kidney disease   . Anxiety   . Panic attack   . Chronic respiratory failure 04/30/2012  . COPD (chronic obstructive pulmonary disease)   . AAA (abdominal aortic aneurysm)   . Cancer     skin Left wrist BCC  . DVT (deep venous thrombosis)   . Aortic aneurysm, abdominal   . Back injury     has had broken back x 2  . Knee problem     Right knee pain  . Arm fracture, right   . Clostridium difficile enteritis 02/2013    treated   . PMB (postmenopausal bleeding) 03/12/2013  . Depression   . Dementia   . Fall at nursing home Aug. 2015    [facility]   Past Surgical History  Procedure Laterality Date  . Hemorrhoid surgery    . Hip arthroplasty Right 01/08/2013    Procedure: ARTHROPLASTY BIPOLAR HIP;  Surgeon: Nita Sells, MD;  Location: [facility];  Service: Orthopedics;  Laterality: Right;  . Fracture surgery Right January 08, 2013    Hip  Current Outpatient Prescriptions on File Prior to Visit  Medication Sig Dispense Refill  .       .      .    12  .      .      .      .      .      .      .      .       .      .      .      .      .      .      .    5  .         Occasions reviewed per [facility] they include.  Tylenol 500 mg daily at bedtime and 1000 mg every morning.  Aricept 10 mg daily.  Vitamin B12 1000 mg injection.  Every monthly.  Folic acid 1 mg daily.  Limited daughter 100 mg every morning.  Namenda 10 mg twice a day.  Milk of magnesia when necessary.  Multivitamin daily.  Potassium 40 mEq daily.  Seroquel 37.5 mg every morning-50 mg daily at bedtime.  Tylenol Extra Strength 500 mg every 8  hours when necessary.  Ensure supplements twice a day between meals.  Prilosec 20 mg twice a day.  Vitamin D 1000 units daily.    Social; the patient has recently been made a DO NOT RESUSCITATE   her mother is deceased. her father is deceased. her maternal grandmother is deceased. She indicated that her maternal grandfather is deceased. t her paternal grandmother is deceased. her paternal grandfather is deceased.   Review of systems; This is not possible from the patient obtained from nursing. In general no complaint fever or chills.  Skin no complaints of rashes or itching has some chronic bruising skin tear on right upper arm.  Head ears eyes nose mouth and throat has prescription lenses does not complain of visual changes or sore throat.  Rest. No complaints of shortness breath or cough.  Cardiac no chest pain minimal lower extremity edema.  GI does not complain of abdominal pain apparently H quite well no nausea or vomiting noted.  GU no complaints of dysuria.  Muscle skeletal has chronic pain especially back pain this appears to be controlled with Tylenol currently.  Neurologic does not complain of dizziness or headache no recent syncopal episodes does have a history of these in the past.  Psych severe dementia although this is stabilized recently with her behaviors     Physical examination Temperature 97.6 pulse 78 respirations 18 blood pressure 131/79 O2 saturation 98% 8 is 85.2  Gen. very frail woman with severe dementia. She is resting comfortably she is alert  Somewhat agitated appears increasingly frail-.  Eyes she has prescription lenses pupils appear reactive light sclera and conjunctiva clear visual acuity appears to be intact  Oropharynx is clear mucous membranes appear fairly moist  Respiratory fairly clear entry bilaterally but poor effort Cardiac regular rate and rhythm without murmur gallop or rub-she does not have significant lower extremity  edema-heart sounds are distant. Abdomen; bowel sounds are positive nontender. History of palpable aortic aneurysm. Muscle skeletal general frailty but I do not note any deformity she does move all extremities 4 with baseline strength.--Holding her legs and a somewhat contracted position this afternoon   -  Neurologic is grossly intact --speech is clear-no lateralizing findings  Psych-has severe dementia  is not really cooperating with exam this afternoon  01/24/2015.  Sodium 136 potassium 4.8 BUN 30 creatinine 1.02.  Albumin 2.4.  L, phosphatase notable at 231 this is up from 136 previously.  WBC 13.1 hemoglobin 10.8 platelets 217 absolute neutrophils 10.5  10/18/2014.  Sodium 141 potassium 4.3 BUN 23 creatinine 1.15.  WBC 7.9 hemoglobin 12.0 platelets are 210.  10/11/2014.  Liver function tests within normal limits except albumen of 2.4.  10/11/2014.  WBC 5.8 hemoglobin 11.3 platelets 219.  Sodium 140 potassium 4.3 BUN 14 creatinine 0.85 CO2 of 29.  Liver function tests within normal limits except albumin of 2.4  .  Impression/plan  weight loss-I suspect this is due to decline with advanced dementia-I have spoken with nursing and they have arranged a meeting with her daughter tomorrow for follow-up-about aggressiveness of care-actually her metabolic panel appears fairly unremarkable although albumin is dropping which is certainly concern she is on supplements-I am somewhat skeptical of any benefit from an appetite stimulant but will discuss this again with her daughter tomorrow    #2 severe dementia on Aricept  Namenda  Lamictal and Seroquel.  I suspect this is the etiology of her weight loss since this is progressing she is followed closely by psychiatric nurse practitioner  #3 very large abdominal aortic aneurysm for which she is not a surgical candidate--as noted above  #4-leukocytosis-she is had thispreviously and it moderated chest x-ray did not really show  any acute process urine only grew out 40,000 colonies of Proteus and again white count did come down without treatment-at this point will start by obtaining a urine culture she has had numerous recent chest x-rays which did not show any acute process-interestingly previous x-rays did show pulmonary nodules question-however follow-up chest x-ray did not show consistency and the most recent chest x-ray did not indicate this at all--- again considering patient's frail status with advanced dementia suspect this would not be pursued aggressively  Again will meet with her daughter tomorrow about aggressiveness of care clinically she does not appear to be unstable but appears to be declining.  [id number] .         Marland Kitchen                                                                                                                                                                                                  7.5        3.41 (L)        10.3 (L)        31.4 (L)        92.1  30.2        32.8        15.5        140 (L)

## 2015-01-26 ENCOUNTER — Non-Acute Institutional Stay (SKILLED_NURSING_FACILITY): Payer: Medicare Other | Admitting: Internal Medicine

## 2015-01-26 ENCOUNTER — Other Ambulatory Visit: Payer: Self-pay | Admitting: *Deleted

## 2015-01-26 ENCOUNTER — Encounter: Payer: Self-pay | Admitting: Internal Medicine

## 2015-01-26 DIAGNOSIS — F03918 Unspecified dementia, unspecified severity, with other behavioral disturbance: Secondary | ICD-10-CM

## 2015-01-26 DIAGNOSIS — I714 Abdominal aortic aneurysm, without rupture, unspecified: Secondary | ICD-10-CM

## 2015-01-26 DIAGNOSIS — R627 Adult failure to thrive: Secondary | ICD-10-CM | POA: Diagnosis not present

## 2015-01-26 DIAGNOSIS — F0391 Unspecified dementia with behavioral disturbance: Secondary | ICD-10-CM | POA: Diagnosis not present

## 2015-01-26 MED ORDER — TRAMADOL HCL 50 MG PO TABS: ORAL_TABLET | ORAL | Status: AC

## 2015-01-26 NOTE — Telephone Encounter (Signed)
[facility] 

## 2015-01-26 NOTE — Progress Notes (Signed)
Patient ID: [name], female   DOB: Feb 21, 1927, 81 y.o.   MRN: [id number]       This is an acute visit   Level care skilled.  Facility [facility].     Chief complaint --  Acute visit -family conference secondary to progressing dementia-weight loss-failure to thrive  Historyof present illness---; this is a patient who is a long-term resident of this facility--most recently she was admitted to hospital with abdominal pain, decreased responsiveness and apparent hypotension when EMS arrived the. She was found to have a UTI and was given Rocephin a. She did have a CT scan of the abdomen that showed enlarging of an already very large infrarenal abdominal aortic aneurysm.  She also had diarrhea and was treated for C. difficile with vancomycin this has been stable recently   -she has been seen by psychiatric nurse practitioner-who has been adjusting her medications currently she is on the Lamictal 100 mg a day and Seroquel twice a day 37.5 mg earlier in the day 50 mg in the evening. \She also continues on Aricept and Namenda  She continues to have behaviors at times   In regards to GI issues- With history of large abdominal aortic aneurysm thought nonoperable-they have seen the vascular surgeon and he again has reiterated that she is a poor surgical candidate and family is aware of this with no aggressive intervention-family is comfortable with this-there is follow-up arranged in approximately 6 months  Most acute issue over the past month appears to be weight loss and failure to thrive-at one point her weight has stabilized however it appears over the past month or so she's lost a significant amount await approximately 15 pounds.  She is on supplements-.  Now she is eating approximately 10% of her meal according to her nurse who is quite familiar with her.  Patient has severe dementia and cannot really give any review of systems-I suspect her poor appetite is largely related to  her end-stage    We are having a family conference today with her daughter secondary to patient's fragile prognosis and desire to get family in put on  view her condition and the plan going forward  We did discuss patient's status with daughter [name] is very involved in her mother's care.  She was quite adamant her mother does not want any feeding tube or aggressive interventions.  She continues to be concerned about the status of the abdomen will aortic aneurysm-after extensive discussion she understands that patient is a very poor candidate for any aggressive workup -- actually has talked with vascular surgery about this as well.  Her main goal is to keep her mother comfortable and try to encourage any by mouth intake is much as possible which we certainly will try to do-nursing also was involved in the meeting.  Her daughter does feel she is having some abdominal discomfort and we did review her medications and see which ones might be contributing to this-we will discontinue the Folic acid as well as multivitamin and Prostat which can be somewhat irritating to the digestive system as well.  She is comfortable with this.  She would also like psychiatric meds titrated down if possible.  I did discuss this over the phone with the psychiatric nurse practitioner and we'll decrease her a.m. Seroquel dose to 25 mg-and her Lamictal dose to 75 mg a day-with plan to further titrate this down as tolerated.  Patient still does continue with behaviors at times so this is somewhat  of a delicate balancing situtation which her daughter understands.  In regards to pain management she is receiving Tylenol her daughter feels that she would need something a bit stronger which I am in agreement with and will start a tramadol 50 mg every 6 hours when necessary this will have to be monitored and nursing has assured me they will keep a close eye on this     Past Medical History  Diagnosis Date  .  Hypertension   . Memory loss   . Anemia   . Chronic back pain   . Chronic kidney disease   . Anxiety   . Panic attack   . Chronic respiratory failure 04/30/2012  . COPD (chronic obstructive pulmonary disease)   . AAA (abdominal aortic aneurysm)   . Cancer     skin Left wrist BCC  . DVT (deep venous thrombosis)   . Aortic aneurysm, abdominal   . Back injury     has had broken back x 2  . Knee problem     Right knee pain  . Arm fracture, right   . Clostridium difficile enteritis 02/2013    treated   . PMB (postmenopausal bleeding) 03/12/2013  . Depression   . Dementia   . Fall at nursing home Aug. 2015    [facility]   Past Surgical History  Procedure Laterality Date  . Hemorrhoid surgery    . Hip arthroplasty Right 01/08/2013    Procedure: ARTHROPLASTY BIPOLAR HIP;  Surgeon: Nita Sells, MD;  Location: [facility];  Service: Orthopedics;  Laterality: Right;  . Fracture surgery Right January 08, 2013    Hip   Current Outpatient Prescriptions on File Prior to Visit  Medication Sig Dispense Refill  .       .      .    12  .      .      .      .      .      .      .      .       .      .      .      .      .      .      .    5  .        Medications reviewed per [facility] they include.  Tylenol 500 mg daily at bedtime and 1000 mg every morning.  Aricept 10 mg daily.  Vitamin B12 1000 mg injection.    Folic acid 1 mg daily.  Lamictal 100 mg every morning.  Namenda 10 mg twice a day.  Milk of magnesia when necessary.  Multivitamin daily.  Potassium 40 mEq daily.  Seroquel 37.5 mg every morning-50 mg daily at bedtime.  Tylenol Extra Strength 500 mg every 8 hours when necessary.  Ensure supplements twice a day between meals.  Prilosec 20 mg twice a day.  Vitamin D 1000 units daily.    Social; the patient has recently been made a DO NOT RESUSCITATE   her mother is deceased. her father is deceased. her maternal grandmother is  deceased. She indicated that her maternal grandfather is deceased. t her paternal grandmother is deceased. her paternal grandfather is deceased.   Review of systems; This is not possible from the patient obtained from nursing. In general no complaint fever or chills.  Skin no complaints of rashes  or itching has some chronic bruising skin tear on right upper arm.  Head ears eyes nose mouth and throat has prescription lenses does not complain of visual changes or sore throat.  Rest. No complaints of shortness breath or cough.  Cardiac no chest pain minimal lower extremity edema.  GI daughter feels she is having some increased abdominal pain recently.  GU no complaints of dysuria.  Muscle skeletal has chronic pain especially back pain daughter feels that again she is having somewhat increased generalized pain  Neurologic does not complain of dizziness or headache no recent syncopal episodes does have a history of these in the past.  Psych severe dementia although this is stabilized recently with her behaviors     Physical examination His afebrile pulse of 80 blood pressure 120/90 respirations 18 weight is 85 pounds  Gen. very frail woman with severe dementia. She is resting comfortably she is alert  Somewhat agitated appears increasingly frail-.  does complain of pain when she is palpated pretty much on any part of her body Skin is warm and dry she does have very fragile skin and has a skin tear present on her right upper arm  Eyes she has prescription lenses pupils appear reactive light sclera and conjunctiva clear visual acuity appears to be intact  Oropharynx is clear mucous membranes appear fairly moist  Respiratory fairly clear entry bilaterally but poor effort Cardiac regular rate and rhythm without murmur gallop or rub-she does not have significant lower extremity edema-heart sounds are distant. Abdomen; bowel sounds are positive she does appear to have some pain with  palpation of the abdomen although again this appears to be somewhat generalized pain on most parts of her body. History of palpable aortic aneurysm. Muscle skeletal general frailty but I do not note any deformity she does move all extremities 4 with baseline strength.--Holding her legs and a somewhat contracted position --has protective boots on bilaterally   -  Neurologic is grossly intact --speech is clear-no lateralizing findings  Psych-has severe dementia is not really cooperating with exam this afternoon  01/24/2015.  Sodium 136 potassium 4.8 BUN 30 creatinine 1.02.  Albumin 2.4.  L, phosphatase notable at 231 this is up from 136 previously.  WBC 13.1 hemoglobin 10.8 platelets 217 absolute neutrophils 10.5  10/18/2014.  Sodium 141 potassium 4.3 BUN 23 creatinine 1.15.  WBC 7.9 hemoglobin 12.0 platelets are 210.  10/11/2014.  Liver function tests within normal limits except albumen of 2.4.  10/11/2014.  WBC 5.8 hemoglobin 11.3 platelets 219.  Sodium 140 potassium 4.3 BUN 14 creatinine 0.85 CO2 of 29.  Liver function tests within normal limits except albumin of 2.4  .  Impression/plan  weight loss-I suspect this is due to decline with advanced dementia- As noted above this was discussed extensively with her daughter with plan as noted above daughter did reiterate wishes for no feeding tube or aggressive intervention-she is comfortable with discontinuing meds as noted above with mphasis on pain control and we will start tramadol as needed I did assure we can titrate her pain medication as needed and at some point I suspect will require morphine but that does not appear to be the case currently    #2 severe dementia on Aricept  Namenda  Lamictal and Seroquel.  I suspect this is the etiology of her weight loss since this is progressing she is followed closely by psychiatric nurse practitioner and we are titrating down her Lamictal and Seroquel as noted above per  discussion with  the psychiatric nurse practitioner  #3 very large abdominal aortic aneurysm for which she is not a surgical candidate--as noted above her daughter is concerned that the aneurysm may be causing abdominal discomfort-which we cannot totally rule out-however patient does not exhibit any symptoms of abrupt rupture here although she is at significant risk here  #4-leukocytosis-she is had thispreviously and it moderated chest x-ray did not really show any acute process urine only grew out 40,000 colonies of Proteus and again white count did come down without treatment-a urine culture is pending interestingly previous x-rays did show pulmonary nodules question-however follow-up chest x-ray did not show consistency and the most recent chest x-ray did not indicate this at all--- again considering patient's frail status with advanced dementia suspect this would not be pursued aggressively  Will update lab work on Friday, August 19 including a CBC and metabolic panel   .  [id number] note greater than 45 minutes spent assessing patient with daughter and nursing-discussing patient's status extensively with her daughter-and coordinating and formulating a plan of care with extensive nursing and family input--note greater than 50% of time spent coordinating plan of care .         Marland Kitchen                                                                                                                                                                                                  7.5        3.41 (L)        10.3 (L)        31.4 (L)        92.1        30.2        32.8        15.5        140 (L)

## 2015-01-28 DIAGNOSIS — D649 Anemia, unspecified: Secondary | ICD-10-CM | POA: Diagnosis not present

## 2015-01-28 LAB — BASIC METABOLIC PANEL
Anion gap: 7 (ref 5–15)
BUN: 25 mg/dL — ABNORMAL HIGH (ref 6–20)
CHLORIDE: 102 mmol/L (ref 101–111)
CO2: 26 mmol/L (ref 22–32)
CREATININE: 0.86 mg/dL (ref 0.44–1.00)
Calcium: 8.2 mg/dL — ABNORMAL LOW (ref 8.9–10.3)
GFR calc Af Amer: >60 mL/min (ref >60)
GFR, EST NON AFRICAN AMERICAN: 59 mL/min — AB (ref >60)
Glucose, Bld: 69 mg/dL (ref 65–99)
Potassium: 4 mmol/L (ref 3.5–5.1)
SODIUM: 135 mmol/L (ref 135–145)

## 2015-01-28 LAB — CBC WITH DIFFERENTIAL/PLATELET
BASOS ABS: 0 10*3/uL (ref 0.0–0.1)
BASOS PCT: 0 % (ref 0–1)
EOS ABS: 0.1 10*3/uL (ref 0.0–0.7)
EOS PCT: 1 % (ref 0–5)
HCT: 31.2 % — ABNORMAL LOW (ref 36.0–46.0)
HEMOGLOBIN: 10.2 g/dL — AB (ref 12.0–15.0)
Lymphocytes Relative: 12 % (ref 12–46)
Lymphs Abs: 1.2 10*3/uL (ref 0.7–4.0)
MCH: 29.1 pg (ref 26.0–34.0)
MCHC: 32.7 g/dL (ref 30.0–36.0)
MCV: 89.1 fL (ref 78.0–100.0)
Monocytes Absolute: 0.9 10*3/uL (ref 0.1–1.0)
Monocytes Relative: 9 % (ref 3–12)
Neutro Abs: 7.8 10*3/uL — ABNORMAL HIGH (ref 1.7–7.7)
Neutrophils Relative %: 78 % — ABNORMAL HIGH (ref 43–77)
PLATELETS: 194 10*3/uL (ref 150–400)
RBC: 3.5 MIL/uL — AB (ref 3.87–5.11)
RDW: 14.9 % (ref 11.5–15.5)
WBC: 10 10*3/uL (ref 4.0–10.5)

## 2015-01-30 LAB — URINE CULTURE: Culture: >=100000

## 2015-04-09 ENCOUNTER — Encounter (HOSPITAL_COMMUNITY)
Admission: RE | Admit: 2015-04-09 | Discharge: 2015-04-09 | Disposition: A | Discharge status: Home / Self Care | Payer: Medicare Other | Source: Skilled Nursing Facility | Attending: Internal Medicine | Admitting: Internal Medicine

## 2015-04-09 ENCOUNTER — Ambulatory Visit (HOSPITAL_COMMUNITY): Payer: Medicare Other

## 2015-04-09 DIAGNOSIS — R509 Fever, unspecified: Secondary | ICD-10-CM | POA: Diagnosis present

## 2015-04-09 LAB — CBC WITH DIFFERENTIAL/PLATELET
BASOS ABS: 0 10*3/uL (ref 0.0–0.1)
BASOS PCT: 1 %
EOS ABS: 0 10*3/uL (ref 0.0–0.7)
Eosinophils Relative: 1 %
HCT: 36.2 % (ref 36.0–46.0)
Hemoglobin: 11.5 g/dL — ABNORMAL LOW (ref 12.0–15.0)
Lymphocytes Relative: 11 %
Lymphs Abs: 0.7 10*3/uL (ref 0.7–4.0)
MCH: 29.7 pg (ref 26.0–34.0)
MCHC: 31.8 g/dL (ref 30.0–36.0)
MCV: 93.5 fL (ref 78.0–100.0)
Monocytes Absolute: 0.7 10*3/uL (ref 0.1–1.0)
Monocytes Relative: 10 %
NEUTROS PCT: 78 %
Neutro Abs: 5.2 10*3/uL (ref 1.7–7.7)
PLATELETS: 116 10*3/uL — AB (ref 150–400)
RBC: 3.87 MIL/uL (ref 3.87–5.11)
RDW: 16 % — ABNORMAL HIGH (ref 11.5–15.5)
WBC: 6.6 10*3/uL (ref 4.0–10.5)

## 2015-04-09 LAB — BASIC METABOLIC PANEL
Anion gap: 9 (ref 5–15)
BUN: 23 mg/dL — ABNORMAL HIGH (ref 6–20)
CO2: 26 mmol/L (ref 22–32)
CREATININE: 0.89 mg/dL (ref 0.44–1.00)
Calcium: 9 mg/dL (ref 8.9–10.3)
Chloride: 105 mmol/L (ref 101–111)
GFR, EST NON AFRICAN AMERICAN: 57 mL/min — AB (ref >60)
Glucose, Bld: 97 mg/dL (ref 65–99)
Potassium: 4.6 mmol/L (ref 3.5–5.1)
SODIUM: 140 mmol/L (ref 135–145)

## 2015-04-09 LAB — BRAIN NATRIURETIC PEPTIDE: B Natriuretic Peptide: 144 pg/mL — ABNORMAL HIGH (ref 0.0–100.0)

## 2015-04-12 ENCOUNTER — Non-Acute Institutional Stay (SKILLED_NURSING_FACILITY): Payer: Medicare Other | Admitting: Internal Medicine

## 2015-04-12 DIAGNOSIS — R627 Adult failure to thrive: Secondary | ICD-10-CM | POA: Diagnosis not present

## 2015-04-12 DIAGNOSIS — T148 Other injury of unspecified body region: Secondary | ICD-10-CM | POA: Diagnosis not present

## 2015-04-12 DIAGNOSIS — T148XXA Other injury of unspecified body region, initial encounter: Secondary | ICD-10-CM

## 2015-04-12 NOTE — Progress Notes (Signed)
Patient ID: [name], female   DOB: 1927/02/08, 81 y.o.   MRN: [id number]        This is an acute visit   Level care skilled.  Facility [facility].     Chief complaint --  Acute visit -secondary to failure to thrive-increased bruising-follow-up edema  Historyof present illness---; this is a patient who is a long-term resident of this facility--most recently she was admitted to hospital with abdominal pain, decreased responsiveness and apparent hypotension when EMS arrived the. She was found to have a UTI and was given Rocephin a. She did have a CT scan of the abdomen that showed enlarging of an already very large infrarenal abdominal aortic aneurysm.  She also had diarrhea and was treated for C. difficile with vancomycin this has been stable recently   -she has been seen by psychiatric nurse practitioner-who has been adjusting her medications currently  continues on Aricept and Namenda  She continues to have behaviors at times   In regards to GI issues- With history of large abdominal aortic aneurysm thought nonoperable-they have seen the vascular surgeon and he again has reiterated that she is a poor surgical candidate and family is aware of this with no aggressive intervention-family is comfortable with this-  She continues to have poor appetite she also had some increased edema earlier this week but this appears to have resolved-she also has some increased bruising most noted of her upper extremity and neck area she does have very fragile skin and I suspect this is secondary to her generally declining status and failure to thrive.  Nursing staff is requesting evaluation secondary to patient's declining status with failure to thrive-this has previously again been discussed with her daughter and she does not really want aggressive interventions            Past Medical History  Diagnosis Date  . Hypertension   . Memory loss   . Anemia   . Chronic back pain     . Chronic kidney disease   . Anxiety   . Panic attack   . Chronic respiratory failure 04/30/2012  . COPD (chronic obstructive pulmonary disease)   . AAA (abdominal aortic aneurysm)   . Cancer     skin Left wrist BCC  . DVT (deep venous thrombosis)   . Aortic aneurysm, abdominal   . Back injury     has had broken back x 2  . Knee problem     Right knee pain  . Arm fracture, right   . Clostridium difficile enteritis 02/2013    treated   . PMB (postmenopausal bleeding) 03/12/2013  . Depression   . Dementia   . Fall at nursing home Aug. 2015    [facility]   Past Surgical History  Procedure Laterality Date  . Hemorrhoid surgery    . Hip arthroplasty Right 01/08/2013    Procedure: ARTHROPLASTY BIPOLAR HIP;  Surgeon: Nita Sells, MD;  Location: [facility];  Service: Orthopedics;  Laterality: Right;  . Fracture surgery Right January 08, 2013    Hip   Current Outpatient Prescriptions on File Prior to Visit  Medication Sig Dispense Refill  .       .      .    12  .      .      .      .      .      .      .      .       .      .      .      .      .      .      Marland Kitchen  5  .        Medications reviewed per [facility] they include.  Tylenol 500 mg daily at bedtime and 1000 mg every morning.  Aricept 10 mg daily.  Vitamin B12 1000 mg injection.      Namenda 10 mg twice a day.  Milk of magnesia when necessary.  Multivitamin daily.  Potassium 40 mEq daily.     Tylenol Extra Strength 500 mg every 8 hours when necessary.  Ensure supplements twice a day between meals.  Prilosec 20 mg twice a day.  Vitamin D 1000 units daily.    Social; the patient has recently been made a DO NOT RESUSCITATE   her mother is deceased. her father is deceased. her maternal grandmother is deceased. She indicated that her maternal grandfather is deceased. t her paternal grandmother is deceased. her paternal grandfather is deceased.   Review of systems; This is not  possible from the patient obtained from nursing. In general no complaint fever or chills.  Skin continues to be at increased risk for bruising most noticeable in her upper extremities and neck area  Head ears eyes nose mouth and throat has prescription lenses does not complain of visual changes or sore throat.  Resp---- No complaints of shortness breath or cough.  Cardiac no chest pain --some edema which appears to have resolved today this was more unilateral one would suspect possibly dependent related.  GI has a poor appetite does not appear to have overt abdominal discomfort nausea vomiting diarrhea or constipation.  GU no complaints of dysuria.  Muscle skeletal has chronic pain especially back pain tramadol appears to help--  Neurologic does not complain of dizziness or headache no recent syncopal episodes does have a history of these in the past.  Psych severe dementia although this is stabilized recently with her behaviors --- but she is having increased lethargy at times    Physical examination Temperature 99.0 pulse 80 respirations 20 blood pressure 101/50 O2 saturation is 90% on room air weight is 87.4 was actually appears to be stable recently  Gen. very frail woman with severe dementia. She is resting comfortably she is is somewhat lethargic but easily arousable   Skin is warm and dry she does have very fragile skin and has some violaceous bruising most noticeable neck area and  lower arms bilaterally  Eyes she has prescription lenses pupils appear reactive light sclera and conjunctiva clear visual acuity appears to be intact  Oropharynx is clear mucous membranes appear somewhat dry  t  Respiratory fairly clear entry bilaterally but poor effort Cardiac regular rate and rhythm without murmur gallop or rub-she does not have significant lower extremity edema-heart sounds are distant. Abdomen; bowel sounds are positive she does appear to have some pain with palpation of  the abdomen although again this appears to be somewhat generalized pain on most parts of her body. History of palpable aortic aneurysm. Muscle skeletal general frailty but I do not note any deformity she does move all extremities 4 with baseline strength.-She is very weak   -  Neurologic is grossly intact she is somewhat lethargic appearing but easily arousable  Psych-has severe dementia is not really cooperating with exam today  Labs.  04/09/2015.  Sodium 140 potassium 4.6 BUN 23 creatinine 0.89.  WBC 6.6 hemoglobin 11.5 platelets 116.  BNP-144  01/24/2015.  Sodium 136 potassium 4.8 BUN 30 creatinine 1.02.  Albumin 2.4.  L, phosphatase notable at 231 this is up from 136 previously.  WBC 13.1 hemoglobin  10.8 platelets 217 absolute neutrophils 10.5  10/18/2014.  Sodium 141 potassium 4.3 BUN 23 creatinine 1.15.  WBC 7.9 hemoglobin 12.0 platelets are 210.  10/11/2014.  Liver function tests within normal limits except albumen of 2.4.  10/11/2014.  WBC 5.8 hemoglobin 11.3 platelets 219.  Sodium 140 potassium 4.3 BUN 14 creatinine 0.85 CO2 of 29.  Liver function tests within normal limits except albumin of 2.4  .  Impression/plan  #1 failure to thrive with some lethargy and poor appetite-this has previously been discussed with her daughter who does not want aggressive measures-I did talk with her again today-she would like IVs for short  term.-We did discuss the benefit of this and she is aware this is more of a "Band-Aid approach"--the feels her mom has been educated from this in the past and would like to try it-will start IV normal saline for 2 L and monitor her clinical status.  If her status does not improve with short-term IVs her daughter does not want to continue.  Also will update lab work including a CBC and BMP tomorrow-she does have some mild thrombocytopenia which probably is contributing to the bruising.  She continues to be a very frail individual  medications have been minimized   [id number]      .         Marland Kitchen                                                                                                                                                                                                  7.5        3.41 (L)        10.3 (L)        31.4 (L)        92.1        30.2        32.8        15.5        140 (L)

## 2015-04-13 ENCOUNTER — Encounter (HOSPITAL_COMMUNITY)
Admission: AD | Admit: 2015-04-13 | Discharge: 2015-04-13 | Disposition: A | Discharge status: Home / Self Care | Payer: Medicare Other | Source: Skilled Nursing Facility | Attending: Internal Medicine | Admitting: Internal Medicine

## 2015-04-13 DIAGNOSIS — N182 Chronic kidney disease, stage 2 (mild): Secondary | ICD-10-CM | POA: Diagnosis not present

## 2015-04-13 DIAGNOSIS — Z87440 Personal history of urinary (tract) infections: Secondary | ICD-10-CM | POA: Insufficient documentation

## 2015-04-13 LAB — CBC WITH DIFFERENTIAL/PLATELET
Basophils Absolute: 0 10*3/uL (ref 0.0–0.1)
Basophils Relative: 0 %
EOS ABS: 0 10*3/uL (ref 0.0–0.7)
EOS PCT: 0 %
HCT: 33 % — ABNORMAL LOW (ref 36.0–46.0)
Hemoglobin: 10.5 g/dL — ABNORMAL LOW (ref 12.0–15.0)
Lymphocytes Relative: 5 %
Lymphs Abs: 0.6 10*3/uL — ABNORMAL LOW (ref 0.7–4.0)
MCH: 29.5 pg (ref 26.0–34.0)
MCHC: 31.8 g/dL (ref 30.0–36.0)
MCV: 92.7 fL (ref 78.0–100.0)
MONOS PCT: 10 %
Monocytes Absolute: 1.2 10*3/uL — ABNORMAL HIGH (ref 0.1–1.0)
Neutro Abs: 10.4 10*3/uL — ABNORMAL HIGH (ref 1.7–7.7)
Neutrophils Relative %: 85 %
PLATELETS: 162 10*3/uL (ref 150–400)
RBC: 3.56 MIL/uL — AB (ref 3.87–5.11)
RDW: 15.8 % — ABNORMAL HIGH (ref 11.5–15.5)
WBC: 12.2 10*3/uL — AB (ref 4.0–10.5)

## 2015-04-13 LAB — BASIC METABOLIC PANEL
Anion gap: 8 (ref 5–15)
BUN: 24 mg/dL — AB (ref 6–20)
CHLORIDE: 107 mmol/L (ref 101–111)
CO2: 26 mmol/L (ref 22–32)
Calcium: 8.5 mg/dL — ABNORMAL LOW (ref 8.9–10.3)
Creatinine, Ser: 0.79 mg/dL (ref 0.44–1.00)
GFR calc non Af Amer: >60 mL/min (ref >60)
Glucose, Bld: 92 mg/dL (ref 65–99)
POTASSIUM: 3.5 mmol/L (ref 3.5–5.1)
SODIUM: 141 mmol/L (ref 135–145)

## 2015-04-13 LAB — URINE MICROSCOPIC-ADD ON

## 2015-04-13 LAB — URINALYSIS, ROUTINE W REFLEX MICROSCOPIC
Bilirubin Urine: NEGATIVE
Glucose, UA: NEGATIVE mg/dL
KETONES UR: NEGATIVE mg/dL
Nitrite: NEGATIVE
Protein, ur: NEGATIVE mg/dL
Specific Gravity, Urine: 1.025 (ref 1.005–1.030)
Urobilinogen, UA: 1 mg/dL (ref 0.0–1.0)
pH: 5.5 (ref 5.0–8.0)

## 2015-04-15 LAB — URINE CULTURE: CULTURE: NO GROWTH

## 2015-04-19 ENCOUNTER — Encounter: Payer: Self-pay | Admitting: Internal Medicine

## 2015-04-19 DIAGNOSIS — T148XXA Other injury of unspecified body region, initial encounter: Secondary | ICD-10-CM | POA: Insufficient documentation

## 2015-04-28 ENCOUNTER — Encounter (HOSPITAL_COMMUNITY)
Admission: AD | Admit: 2015-04-28 | Discharge: 2015-04-28 | Disposition: A | Discharge status: Home / Self Care | Payer: Medicare Other | Source: Skilled Nursing Facility | Attending: Internal Medicine | Admitting: Internal Medicine

## 2015-04-28 DIAGNOSIS — N182 Chronic kidney disease, stage 2 (mild): Secondary | ICD-10-CM | POA: Diagnosis not present

## 2015-04-28 LAB — LIPID PANEL
CHOL/HDL RATIO: 3.5 ratio
CHOLESTEROL: 120 mg/dL (ref 0–200)
HDL: 34 mg/dL — ABNORMAL LOW (ref >40)
LDL Cholesterol: 72 mg/dL (ref 0–99)
Triglycerides: 71 mg/dL (ref <150)
VLDL: 14 mg/dL (ref 0–40)

## 2015-04-29 LAB — HEMOGLOBIN A1C
Hgb A1c MFr Bld: 4.9 % (ref 4.8–5.6)
MEAN PLASMA GLUCOSE: 94 mg/dL

## 2015-05-18 ENCOUNTER — Non-Acute Institutional Stay (SKILLED_NURSING_FACILITY): Payer: Medicare Other | Admitting: Internal Medicine

## 2015-05-18 DIAGNOSIS — T148 Other injury of unspecified body region: Secondary | ICD-10-CM | POA: Diagnosis not present

## 2015-05-18 DIAGNOSIS — T148XXA Other injury of unspecified body region, initial encounter: Secondary | ICD-10-CM

## 2015-05-18 DIAGNOSIS — R5383 Other fatigue: Secondary | ICD-10-CM

## 2015-05-18 NOTE — Progress Notes (Signed)
Patient ID: [name], female   DOB: Sep 15, 1926, 81 y.o.   MRN: [id number]          This is an acute visit   Level care skilled.  Facility [facility].     Chief complaint --acute visit to assess small open area lower back.  History of present illness Patient is an 81 year old female with severe dementia and failure to thrive-she has extremely fragile skin her daughter has noted she has a small open area lower spine area where there is a slight protrusion of the spine-would like follow-up on this.  Patient is a poor nhistorian-- however she does not really complain of pain there's been no recent history of trauma to the area to my knowledge      Past Medical History  Diagnosis Date  . Hypertension   . Memory loss   . Anemia   . Chronic back pain   . Chronic kidney disease   . Anxiety   . Panic attack   . Chronic respiratory failure 04/30/2012  . COPD (chronic obstructive pulmonary disease)   . AAA (abdominal aortic aneurysm)   . Cancer     skin Left wrist BCC  . DVT (deep venous thrombosis)   . Aortic aneurysm, abdominal   . Back injury     has had broken back x 2  . Knee problem     Right knee pain  . Arm fracture, right   . Clostridium difficile enteritis 02/2013    treated   . PMB (postmenopausal bleeding) 03/12/2013  . Depression   . Dementia   . Fall at nursing home Aug. 2015    [facility]   Past Surgical History  Procedure Laterality Date  . Hemorrhoid surgery    . Hip arthroplasty Right 01/08/2013    Procedure: ARTHROPLASTY BIPOLAR HIP;  Surgeon: Nita Sells, MD;  Location: [facility];  Service: Orthopedics;  Laterality: Right;  . Fracture surgery Right January 08, 2013    Hip   Current Outpatient Prescriptions on File Prior to Visit  Medication Sig Dispense Refill  .       .      .    12  .      .      .      .      .      .      .      .       .      .      .      .      .      .      .    5  .         Medications reviewed per [facility] they include.  Tylenol 500 mg daily at bedtime and 1000 mg every morning.  Aricept 10 mg daily.  Vitamin B12 1000 mg injection.      Namenda 10 mg twice a day.  Milk of magnesia when necessary.  Multivitamin daily.  Potassium 40 mEq daily.     Tylenol Extra Strength 500 mg every 8 hours when necessary.  Ensure supplements twice a day between meals.  Prilosec 20 mg twice a day.  Vitamin D 1000 units daily.    Social; the patient has recently been made a DO NOT RESUSCITATE   her mother is deceased. her father is deceased. her maternal grandmother is deceased. She indicated that  her maternal grandfather is deceased. t her paternal grandmother is deceased. her paternal grandfather is deceased.   Review of systems; This is not possible from the patient obtained from nursing. In general no complaint fever or chills.  Skin continues to be at increased risk for bruising most noticeable in her upper extremities and neck area--again small open area noted lower back area  Head ears eyes nose mouth and throat has prescription lenses does not complain of visual changes or sore throat.  Resp---- No complaints of shortness breath or cough.  Cardiac no chest pain --small amount of edema that appears to come and go    .  Muscle skeletal has chronic pain especially back pain tramadol appears to help--     Psych severe dementia although this is stabilized recently with her behaviors --- Apparently lethargy has improved somewhat recently    Physical examination  She is afebrile pulse of 84 respirations 19  Gen. very frail woman with severe dementia. She is resting comfortably  She is alert and responsive   Skin is warm and dry she does have very fragile skin and has some violaceous bruising most noticeable neck area and  lower arms bilaterally--this is now extending to her shoulder area against very fragile skin.  I do note on  protrusion of her spine lower back there is a small area that looks like an abrasion there is a mildly erythematous wound bed there is no tenderness to this or drainage or warmth this appears to be fairly superficial at this point--there is no erythema extending from this area  Eyes she has prescription lenses pupils appear reactive light sclera and conjunctiva clear visual acuity appears to be intact  Oropharynx is clear mucous membranes appear somewhat dry  t  Respiratory fairly clear entry bilaterally but poor effort Cardiac regular rate and rhythm without murmur gallop or rub-she does not have significant lower extremity edema-heart sounds are distant. Abdomen; bowel sounds are positive Does not complain of overt tenderness tonight-abdomen is soft and cathartic appearing Muscle skeletal general frailty but I do not note any deformity she does move all extremities 4 with baseline strength.-She is very weak   -  Neurologic --she is alert this evening  Psych-has severe dementia and has difficulty following verbal commands but she is not agitated tonight  Labs.  04/09/2015.  Sodium 140 potassium 4.6 BUN 23 creatinine 0.89.  WBC 6.6 hemoglobin 11.5 platelets 116.  BNP-144  01/24/2015.  Sodium 136 potassium 4.8 BUN 30 creatinine 1.02.  Albumin 2.4.  L, phosphatase notable at 231 this is up from 136 previously.  WBC 13.1 hemoglobin 10.8 platelets 217 absolute neutrophils 10.5  10/18/2014.  Sodium 141 potassium 4.3 BUN 23 creatinine 1.15.  WBC 7.9 hemoglobin 12.0 platelets are 210.  10/11/2014.  Liver function tests within normal limits except albumen of 2.4.  10/11/2014.  WBC 5.8 hemoglobin 11.3 platelets 219.  Sodium 140 potassium 4.3 BUN 14 creatinine 0.85 CO2 of 29.  Liver function tests within normal limits except albumin of 2.4  .  Impression/plan  #1  Skin abrasion lower back-at this point does not have signs of infection we'll treat conservatively  with protective dressing and have wound care to follow-up on this-at this point appears to be quite superficial.  #2 lethargy-at one point she did receive IV fluids-she has perked up some she is more alert and interactive although I suspect this will continue to be a challenge as her dementia continues to progress.  [id number]      .         Marland Kitchen  7.5        3.41 (L)        10.3 (L)        31.4 (L)        92.1        30.2        32.8        15.5        140 (L)

## 2015-06-12 DEATH — deceased

## 2015-07-20 ENCOUNTER — Ambulatory Visit: Payer: Medicare Other | Admitting: Vascular Surgery

## 2016-04-25 IMAGING — CT CT HEAD W/O CM
4 of 5 series · 17 of 47 positions shown, 18 images · non-contrast
Comparison: Head CT 09/12/2007.

CLINICAL DATA: Fall.  Left chest and neck pain.

EXAM:
CT HEAD WITHOUT CONTRAST
CT CERVICAL SPINE WITHOUT CONTRAST
TECHNIQUE: Multidetector CT imaging of the head and cervical spine was
performed following the standard protocol without intravenous
contrast. Multiplanar CT image reconstructions of the cervical spine
were also generated.

[Series 2: headseq 4.8 h37s · axial · 0.43mm/px · z∈[+250,+337]mm · 3 of 36 slices shown, 4 images]
[im 9/36  brain]
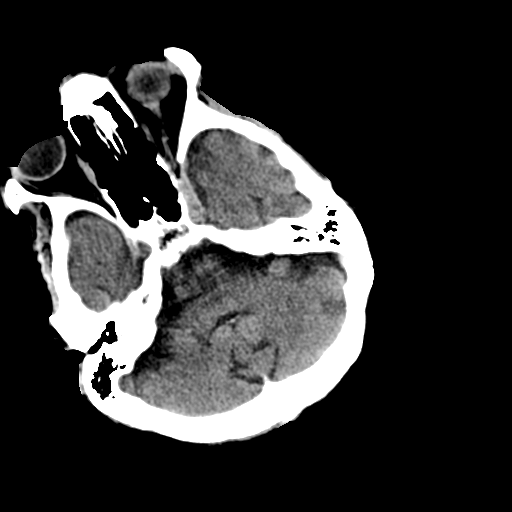
[im 9/36  bone]
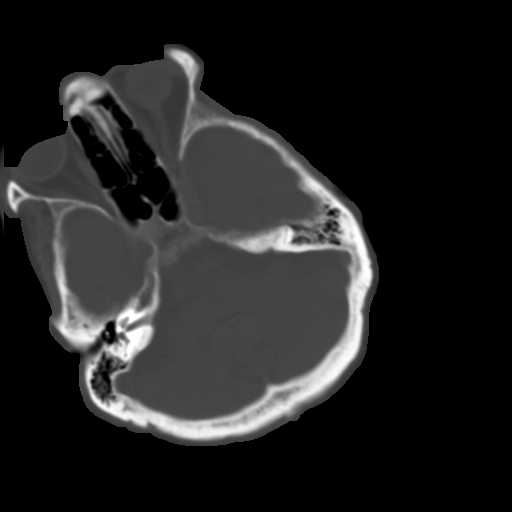
[im 18/36  brain]
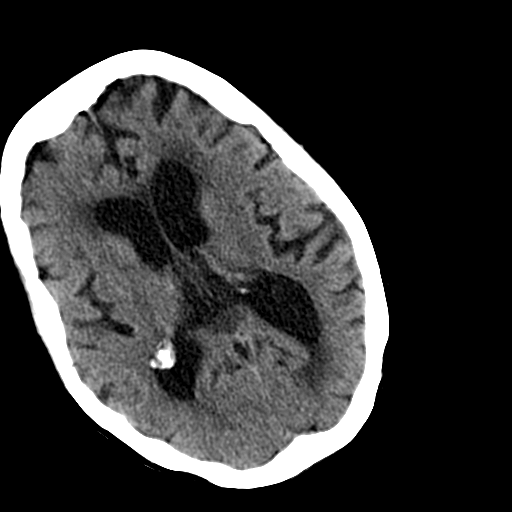
[im 27/36  brain]
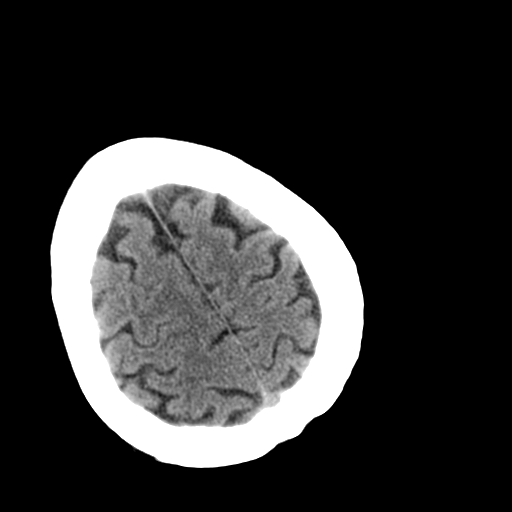

[Series 7: sagittal bone 2.0 · sagittal · 0.35mm/px · 3 of 47 slices shown]
[im 16/47  brain]
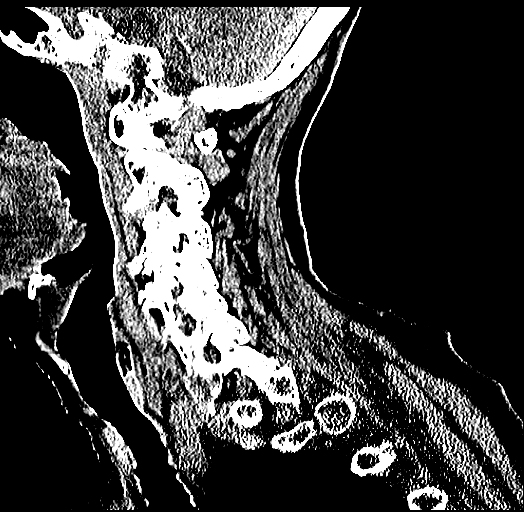
[im 24/47  brain]
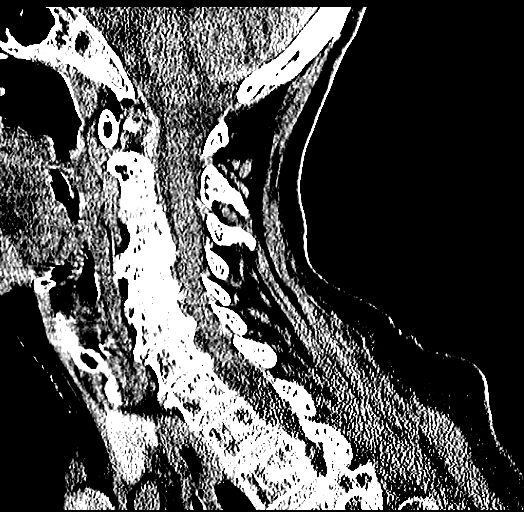
[im 31/47  brain]
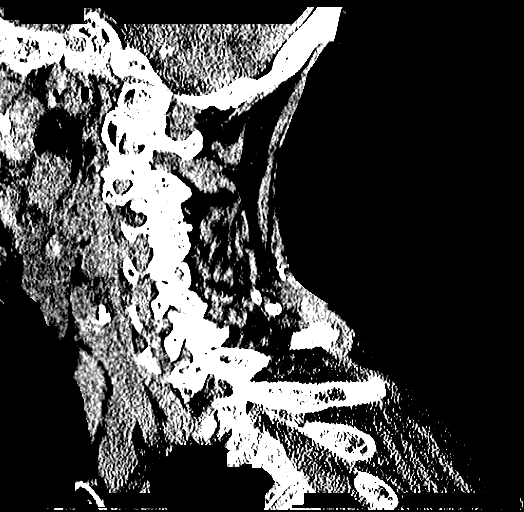

[Series 8: coronal bone 2.0 · coronal · 0.36mm/px · 3 of 39 slices shown]
[im 13/39  brain]
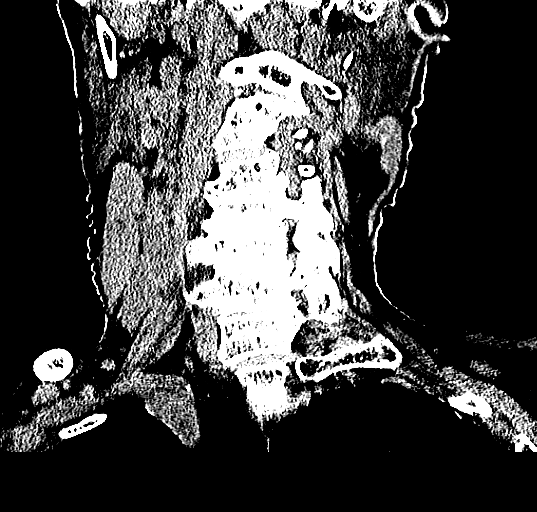
[im 17/39  brain]
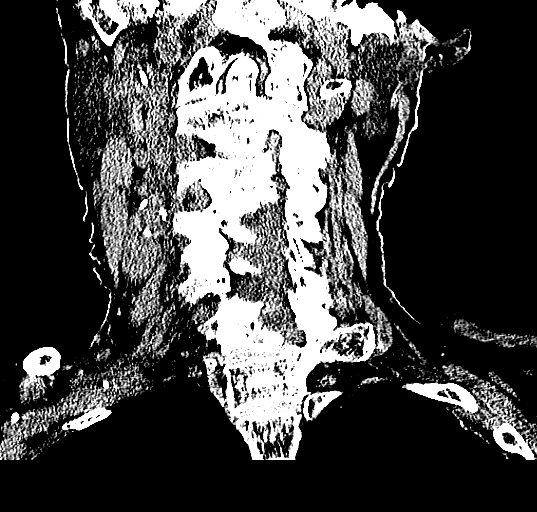
[im 22/39  brain]
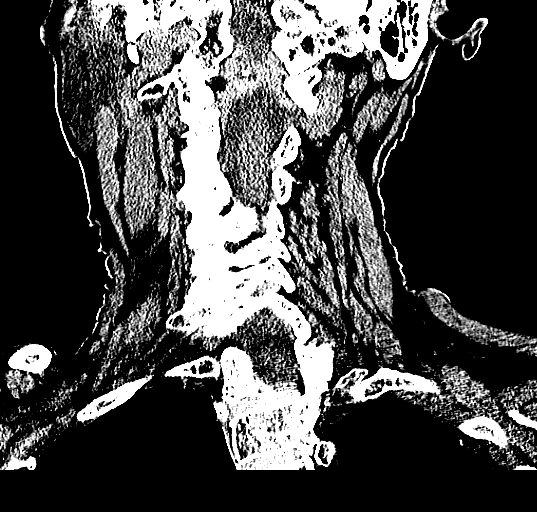

[Series 9: axial bone 2.0 · axial · 0.22mm/px · z∈[+41,+185]mm · 8 of 93 slices shown]
[im 8/93  bone]
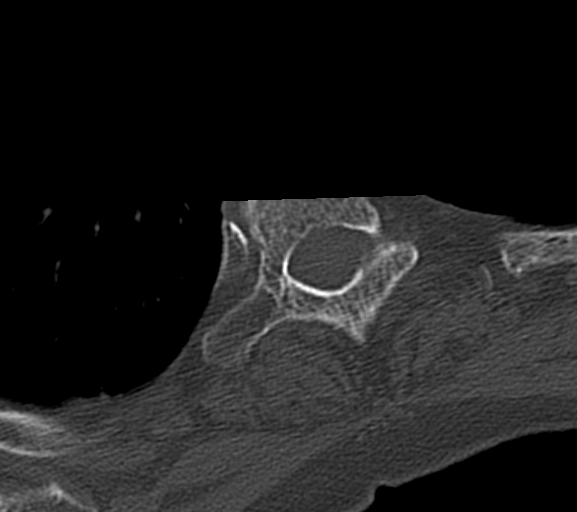
[im 24/93  bone]
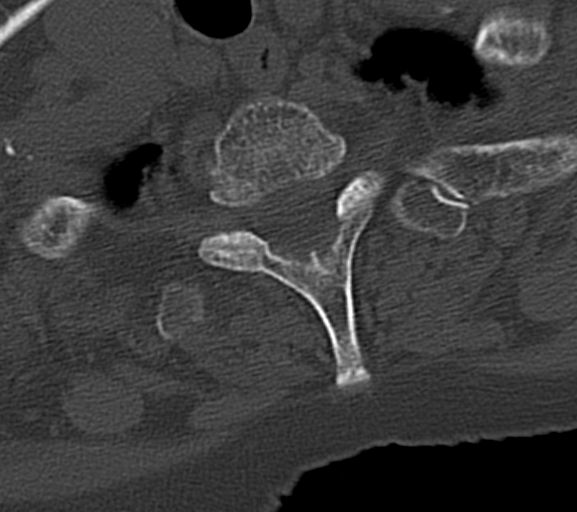
[im 31/93  bone]
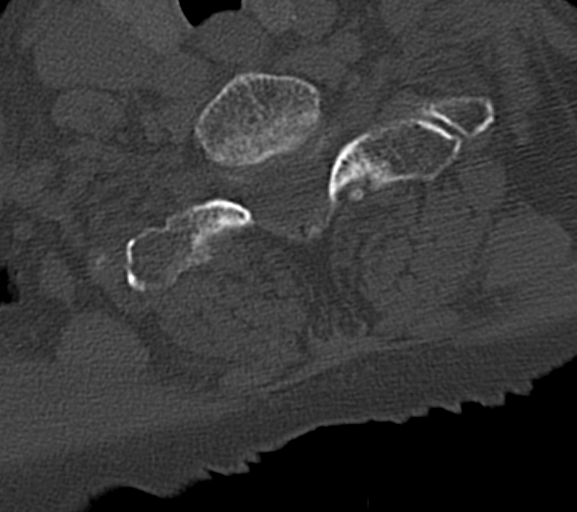
[im 39/93  bone]
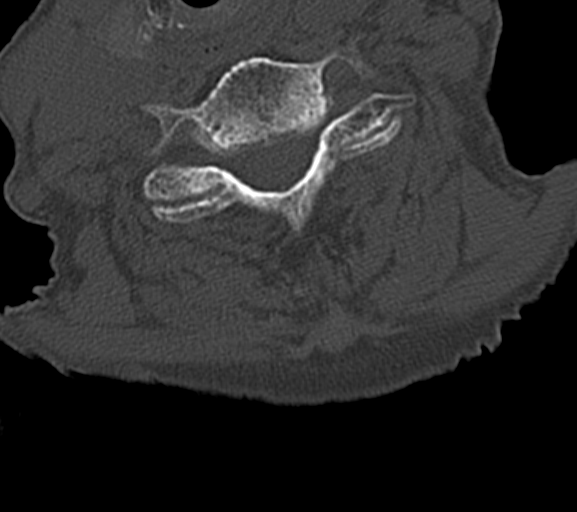
[im 54/93  bone]
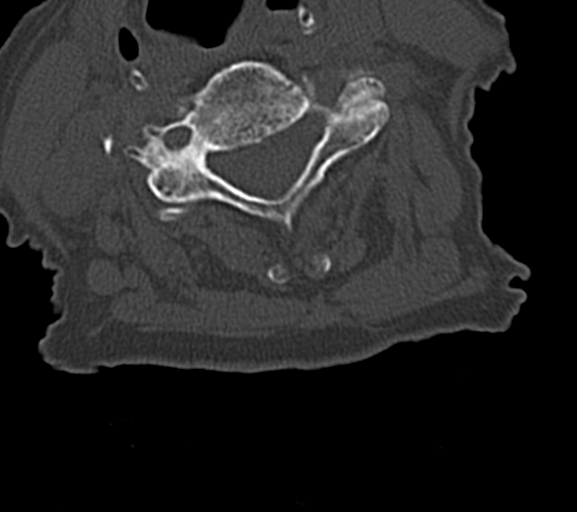
[im 62/93  bone]
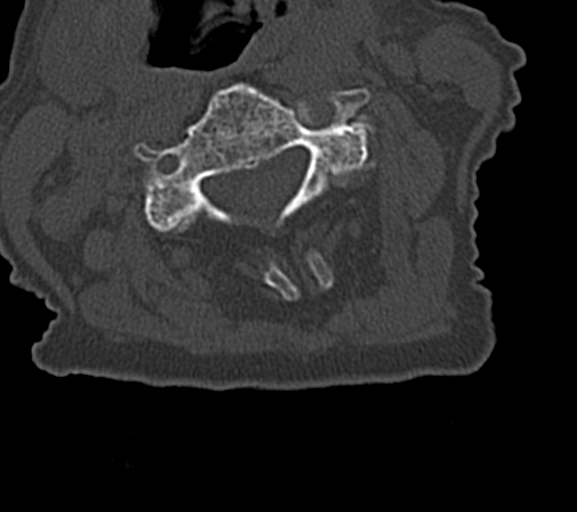
[im 70/93  bone]
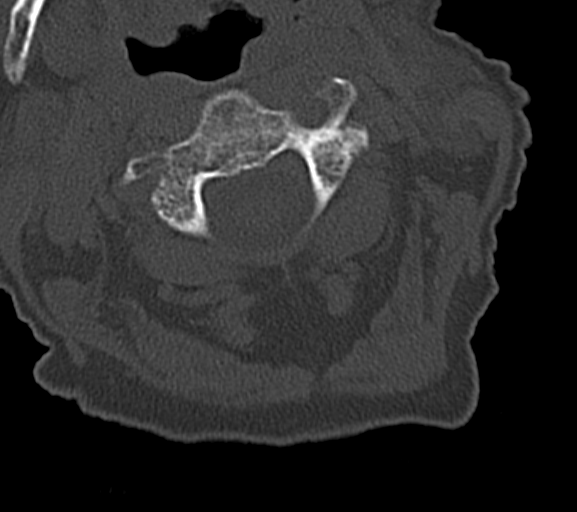
[im 85/93  bone]
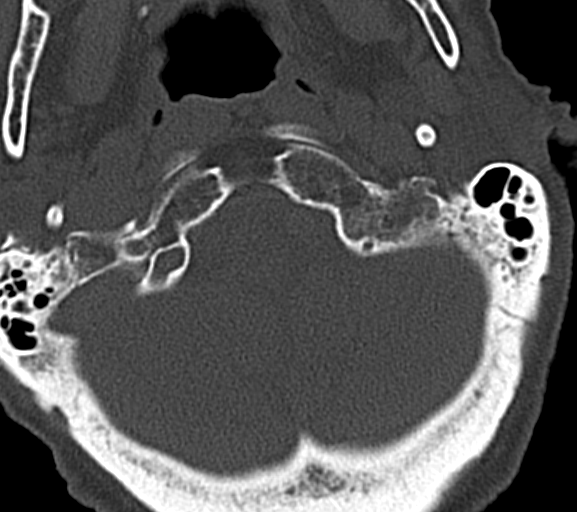

[17 of 47 positions shown; findings below may reference images not displayed]

FINDINGS: CT HEAD FINDINGS

There is no evidence of acute intracranial hemorrhage, mass lesion,
brain edema or extra-axial fluid collection. The ventricles and
subarachnoid spaces are progressively dilated compared with the
prior study. There is no CT evidence of acute cortical infarction.
There is progressive periventricular white matter disease.

The visualized paranasal sinuses, mastoid air cells and middle ears
are clear. The calvarium is intact.

CT CERVICAL SPINE FINDINGS

There is no evidence of acute cervical spine fracture or traumatic
subluxation. There is multilevel spondylosis associated with a
convex right scoliosis. There is resulting disc space loss with
uncinate spurring and facet disease, most advanced from C3-4 through
C6-7. Ossification of the ligamentum nuchae is noted. No acute soft
tissue findings are apparent. There is mild nodularity of the
thyroid gland.
IMPRESSION: 1. No acute intracranial or calvarial findings.
2. Progressive atrophy and periventricular white matter disease.
3. No evidence acute cervical spine fracture, traumatic subluxation
or static signs of instability. Multilevel spondylosis.
